# Patient Record
Sex: Male | Born: 1965 | Race: White | Hispanic: No | Smoking: Former smoker
Health system: Southern US, Community
[De-identification: ages and names within clinical notes are randomized; demographics above are authoritative.]

## PROBLEM LIST (undated history)

## (undated) DIAGNOSIS — J449 Chronic obstructive pulmonary disease, unspecified: Secondary | ICD-10-CM

## (undated) DIAGNOSIS — I5020 Unspecified systolic (congestive) heart failure: Secondary | ICD-10-CM

## (undated) DIAGNOSIS — I428 Other cardiomyopathies: Secondary | ICD-10-CM

## (undated) DIAGNOSIS — Z95810 Presence of automatic (implantable) cardiac defibrillator: Secondary | ICD-10-CM

## (undated) DIAGNOSIS — E039 Hypothyroidism, unspecified: Secondary | ICD-10-CM

## (undated) DIAGNOSIS — I4819 Other persistent atrial fibrillation: Secondary | ICD-10-CM

## (undated) DIAGNOSIS — I10 Essential (primary) hypertension: Secondary | ICD-10-CM

## (undated) DIAGNOSIS — E876 Hypokalemia: Secondary | ICD-10-CM

## (undated) DIAGNOSIS — G4730 Sleep apnea, unspecified: Secondary | ICD-10-CM

## (undated) DIAGNOSIS — F329 Major depressive disorder, single episode, unspecified: Secondary | ICD-10-CM

## (undated) DIAGNOSIS — Z8719 Personal history of other diseases of the digestive system: Secondary | ICD-10-CM

## (undated) DIAGNOSIS — I4821 Permanent atrial fibrillation: Secondary | ICD-10-CM

## (undated) HISTORY — DX: Other cardiomyopathies: I42.8

## (undated) HISTORY — DX: Major depressive disorder, single episode, unspecified: F32.9

## (undated) HISTORY — DX: Hypokalemia: E87.6

## (undated) HISTORY — PX: CARDIAC DEFIBRILLATOR PLACEMENT: SHX171

## (undated) HISTORY — DX: Personal history of other diseases of the digestive system: Z87.19

## (undated) HISTORY — DX: Permanent atrial fibrillation: I48.21

## (undated) HISTORY — DX: Hypothyroidism, unspecified: E03.9

## (undated) HISTORY — PX: SHOULDER DEBRIDEMENT: SHX1052

## (undated) HISTORY — DX: Unspecified systolic (congestive) heart failure: I50.20

## (undated) HISTORY — DX: Other persistent atrial fibrillation: I48.19

## (undated) HISTORY — PX: PACEMAKER IMPLANT: EP1218

---

## 2001-10-25 ENCOUNTER — Emergency Department (HOSPITAL_COMMUNITY): Admission: EM | Admit: 2001-10-25 | Discharge: 2001-10-25 | Payer: Self-pay | Admitting: Emergency Medicine

## 2001-10-25 ENCOUNTER — Encounter: Payer: Self-pay | Admitting: Emergency Medicine

## 2001-11-03 ENCOUNTER — Emergency Department (HOSPITAL_COMMUNITY): Admission: EM | Admit: 2001-11-03 | Discharge: 2001-11-03 | Payer: Self-pay

## 2008-06-09 ENCOUNTER — Emergency Department (HOSPITAL_COMMUNITY): Admission: EM | Admit: 2008-06-09 | Discharge: 2008-06-09 | Payer: Self-pay | Admitting: Emergency Medicine

## 2009-11-20 ENCOUNTER — Inpatient Hospital Stay: Payer: Self-pay | Admitting: Internal Medicine

## 2010-06-08 LAB — BASIC METABOLIC PANEL
BUN: 17 mg/dL (ref 6–23)
CO2: 23 mEq/L (ref 19–32)
Calcium: 9.5 mg/dL (ref 8.4–10.5)
Chloride: 94 mEq/L — ABNORMAL LOW (ref 96–112)
Creatinine, Ser: 1.27 mg/dL (ref 0.4–1.5)
GFR calc Af Amer: >60 mL/min (ref >60)
GFR calc non Af Amer: >60 mL/min (ref >60)
Glucose, Bld: 125 mg/dL — ABNORMAL HIGH (ref 70–99)
Potassium: 3.5 mEq/L (ref 3.5–5.1)
Sodium: 132 mEq/L — ABNORMAL LOW (ref 135–145)

## 2010-06-08 LAB — CBC
HCT: 50.3 % (ref 39.0–52.0)
Hemoglobin: 17.5 g/dL — ABNORMAL HIGH (ref 13.0–17.0)
MCHC: 34.9 g/dL (ref 30.0–36.0)
MCV: 93.5 fL (ref 78.0–100.0)
Platelets: 145 10*3/uL — ABNORMAL LOW (ref 150–400)
RBC: 5.38 MIL/uL (ref 4.22–5.81)
RDW: 13.2 % (ref 11.5–15.5)
WBC: 10.3 10*3/uL (ref 4.0–10.5)

## 2010-06-08 LAB — URINE MICROSCOPIC-ADD ON

## 2010-06-08 LAB — DIFFERENTIAL
Basophils Absolute: 0 10*3/uL (ref 0.0–0.1)
Basophils Relative: 0 % (ref 0–1)
Monocytes Relative: 13 % — ABNORMAL HIGH (ref 3–12)
Neutro Abs: 8.1 10*3/uL — ABNORMAL HIGH (ref 1.7–7.7)
Neutrophils Relative %: 79 % — ABNORMAL HIGH (ref 43–77)

## 2010-06-08 LAB — URINALYSIS, ROUTINE W REFLEX MICROSCOPIC
Glucose, UA: NEGATIVE mg/dL
Ketones, ur: NEGATIVE mg/dL
Leukocytes, UA: NEGATIVE
Nitrite: NEGATIVE
Protein, ur: 100 mg/dL — AB
Specific Gravity, Urine: >1.03 — ABNORMAL HIGH (ref 1.005–1.030)
Urobilinogen, UA: 0.2 mg/dL (ref 0.0–1.0)
pH: 6 (ref 5.0–8.0)

## 2013-04-01 DIAGNOSIS — Z9989 Dependence on other enabling machines and devices: Secondary | ICD-10-CM

## 2013-04-01 DIAGNOSIS — G4733 Obstructive sleep apnea (adult) (pediatric): Secondary | ICD-10-CM | POA: Insufficient documentation

## 2013-04-01 DIAGNOSIS — F1290 Cannabis use, unspecified, uncomplicated: Secondary | ICD-10-CM | POA: Insufficient documentation

## 2013-04-01 DIAGNOSIS — Z720 Tobacco use: Secondary | ICD-10-CM | POA: Insufficient documentation

## 2013-04-01 DIAGNOSIS — I10 Essential (primary) hypertension: Secondary | ICD-10-CM | POA: Insufficient documentation

## 2013-04-01 DIAGNOSIS — R109 Unspecified abdominal pain: Secondary | ICD-10-CM | POA: Insufficient documentation

## 2013-04-01 DIAGNOSIS — I5022 Chronic systolic (congestive) heart failure: Secondary | ICD-10-CM | POA: Insufficient documentation

## 2013-04-01 DIAGNOSIS — J449 Chronic obstructive pulmonary disease, unspecified: Secondary | ICD-10-CM | POA: Insufficient documentation

## 2013-04-01 DIAGNOSIS — I4819 Other persistent atrial fibrillation: Secondary | ICD-10-CM | POA: Insufficient documentation

## 2013-04-01 DIAGNOSIS — M109 Gout, unspecified: Secondary | ICD-10-CM | POA: Insufficient documentation

## 2013-04-03 DIAGNOSIS — Z515 Encounter for palliative care: Secondary | ICD-10-CM | POA: Insufficient documentation

## 2013-04-03 DIAGNOSIS — K7200 Acute and subacute hepatic failure without coma: Secondary | ICD-10-CM | POA: Insufficient documentation

## 2013-04-03 DIAGNOSIS — R5383 Other fatigue: Secondary | ICD-10-CM | POA: Insufficient documentation

## 2013-04-04 DIAGNOSIS — J9691 Respiratory failure, unspecified with hypoxia: Secondary | ICD-10-CM | POA: Insufficient documentation

## 2013-09-26 DIAGNOSIS — I447 Left bundle-branch block, unspecified: Secondary | ICD-10-CM | POA: Insufficient documentation

## 2013-09-26 DIAGNOSIS — Z9119 Patient's noncompliance with other medical treatment and regimen: Secondary | ICD-10-CM | POA: Insufficient documentation

## 2013-09-26 DIAGNOSIS — N182 Chronic kidney disease, stage 2 (mild): Secondary | ICD-10-CM | POA: Insufficient documentation

## 2013-09-26 DIAGNOSIS — F1910 Other psychoactive substance abuse, uncomplicated: Secondary | ICD-10-CM | POA: Insufficient documentation

## 2013-09-26 DIAGNOSIS — Z91199 Patient's noncompliance with other medical treatment and regimen due to unspecified reason: Secondary | ICD-10-CM | POA: Insufficient documentation

## 2013-09-26 DIAGNOSIS — I2510 Atherosclerotic heart disease of native coronary artery without angina pectoris: Secondary | ICD-10-CM | POA: Insufficient documentation

## 2013-12-01 ENCOUNTER — Emergency Department: Payer: Self-pay | Admitting: Emergency Medicine

## 2013-12-01 DIAGNOSIS — Z4502 Encounter for adjustment and management of automatic implantable cardiac defibrillator: Secondary | ICD-10-CM | POA: Insufficient documentation

## 2013-12-01 LAB — COMPREHENSIVE METABOLIC PANEL
ALBUMIN: 3.6 g/dL (ref 3.4–5.0)
ALK PHOS: 76 U/L
ALT: 20 U/L
ANION GAP: 6 — AB (ref 7–16)
BILIRUBIN TOTAL: 0.6 mg/dL (ref 0.2–1.0)
BUN: 11 mg/dL (ref 7–18)
Calcium, Total: 8.6 mg/dL (ref 8.5–10.1)
Chloride: 107 mmol/L (ref 98–107)
Co2: 28 mmol/L (ref 21–32)
Creatinine: 1.21 mg/dL (ref 0.60–1.30)
EGFR (Non-African Amer.): >60
GLUCOSE: 107 mg/dL — AB (ref 65–99)
Osmolality: 281 (ref 275–301)
Potassium: 4 mmol/L (ref 3.5–5.1)
SGOT(AST): 28 U/L (ref 15–37)
Sodium: 141 mmol/L (ref 136–145)
TOTAL PROTEIN: 7.2 g/dL (ref 6.4–8.2)

## 2013-12-01 LAB — CBC
HCT: 45 % (ref 40.0–52.0)
HGB: 14.5 g/dL (ref 13.0–18.0)
MCH: 31 pg (ref 26.0–34.0)
MCHC: 32.3 g/dL (ref 32.0–36.0)
MCV: 96 fL (ref 80–100)
PLATELETS: 127 10*3/uL — AB (ref 150–440)
RBC: 4.69 10*6/uL (ref 4.40–5.90)
RDW: 14.9 % — AB (ref 11.5–14.5)
WBC: 8 10*3/uL (ref 3.8–10.6)

## 2013-12-01 LAB — PROTIME-INR
INR: 1
Prothrombin Time: 12.9 secs (ref 11.5–14.7)

## 2013-12-01 LAB — TROPONIN I: TROPONIN-I: 0.16 ng/mL — AB

## 2013-12-01 LAB — APTT: ACTIVATED PTT: 27.4 s (ref 23.6–35.9)

## 2013-12-01 LAB — CK TOTAL AND CKMB (NOT AT ARMC)
CK, Total: 96 U/L
CK-MB: 3.5 ng/mL (ref 0.5–3.6)

## 2014-03-19 DIAGNOSIS — R1011 Right upper quadrant pain: Secondary | ICD-10-CM | POA: Insufficient documentation

## 2014-03-19 DIAGNOSIS — R1013 Epigastric pain: Secondary | ICD-10-CM | POA: Insufficient documentation

## 2015-04-29 ENCOUNTER — Emergency Department (HOSPITAL_COMMUNITY): Payer: Medicaid Other

## 2015-04-29 ENCOUNTER — Emergency Department (HOSPITAL_COMMUNITY)
Admission: EM | Admit: 2015-04-29 | Discharge: 2015-04-29 | Disposition: A | Discharge status: Home / Self Care | Payer: Medicaid Other | Attending: Emergency Medicine | Admitting: Emergency Medicine

## 2015-04-29 ENCOUNTER — Encounter (HOSPITAL_COMMUNITY): Payer: Self-pay | Admitting: Emergency Medicine

## 2015-04-29 DIAGNOSIS — J069 Acute upper respiratory infection, unspecified: Secondary | ICD-10-CM | POA: Diagnosis not present

## 2015-04-29 DIAGNOSIS — I10 Essential (primary) hypertension: Secondary | ICD-10-CM | POA: Diagnosis not present

## 2015-04-29 DIAGNOSIS — Z950 Presence of cardiac pacemaker: Secondary | ICD-10-CM | POA: Diagnosis not present

## 2015-04-29 DIAGNOSIS — E669 Obesity, unspecified: Secondary | ICD-10-CM | POA: Diagnosis not present

## 2015-04-29 DIAGNOSIS — J441 Chronic obstructive pulmonary disease with (acute) exacerbation: Secondary | ICD-10-CM | POA: Diagnosis not present

## 2015-04-29 DIAGNOSIS — I509 Heart failure, unspecified: Secondary | ICD-10-CM | POA: Insufficient documentation

## 2015-04-29 DIAGNOSIS — Z87891 Personal history of nicotine dependence: Secondary | ICD-10-CM | POA: Diagnosis not present

## 2015-04-29 DIAGNOSIS — R079 Chest pain, unspecified: Secondary | ICD-10-CM | POA: Diagnosis present

## 2015-04-29 HISTORY — DX: Chronic obstructive pulmonary disease, unspecified: J44.9

## 2015-04-29 HISTORY — DX: Essential (primary) hypertension: I10

## 2015-04-29 LAB — CBC
HEMATOCRIT: 43.3 % (ref 39.0–52.0)
Hemoglobin: 14.5 g/dL (ref 13.0–17.0)
MCH: 32.2 pg (ref 26.0–34.0)
MCHC: 33.5 g/dL (ref 30.0–36.0)
MCV: 96 fL (ref 78.0–100.0)
Platelets: 136 10*3/uL — ABNORMAL LOW (ref 150–400)
RBC: 4.51 MIL/uL (ref 4.22–5.81)
RDW: 13.8 % (ref 11.5–15.5)
WBC: 10.3 10*3/uL (ref 4.0–10.5)

## 2015-04-29 LAB — I-STAT TROPONIN, ED: Troponin i, poc: 0.03 ng/mL (ref 0.00–0.08)

## 2015-04-29 LAB — BASIC METABOLIC PANEL
Anion gap: 12 (ref 5–15)
BUN: 9 mg/dL (ref 6–20)
CHLORIDE: 101 mmol/L (ref 101–111)
CO2: 25 mmol/L (ref 22–32)
Calcium: 9.2 mg/dL (ref 8.9–10.3)
Creatinine, Ser: 1.4 mg/dL — ABNORMAL HIGH (ref 0.61–1.24)
GFR calc non Af Amer: 57 mL/min — ABNORMAL LOW (ref >60)
Glucose, Bld: 104 mg/dL — ABNORMAL HIGH (ref 65–99)
POTASSIUM: 4.4 mmol/L (ref 3.5–5.1)
SODIUM: 138 mmol/L (ref 135–145)

## 2015-04-29 MED ORDER — IBUPROFEN 800 MG PO TABS
800.0000 mg | ORAL_TABLET | Freq: Once | ORAL | Status: AC
  Administered 2015-04-29: 800 mg via ORAL
  Filled 2015-04-29: qty 1

## 2015-04-29 MED ORDER — IPRATROPIUM-ALBUTEROL 0.5-2.5 (3) MG/3ML IN SOLN
3.0000 mL | Freq: Once | RESPIRATORY_TRACT | Status: AC
  Administered 2015-04-29: 3 mL via RESPIRATORY_TRACT
  Filled 2015-04-29: qty 3

## 2015-04-29 MED ORDER — ACETAMINOPHEN 325 MG PO TABS
650.0000 mg | ORAL_TABLET | Freq: Once | ORAL | Status: AC
  Administered 2015-04-29: 650 mg via ORAL
  Filled 2015-04-29: qty 2

## 2015-04-29 MED ORDER — PREDNISONE 20 MG PO TABS
60.0000 mg | ORAL_TABLET | Freq: Once | ORAL | Status: AC
  Administered 2015-04-29: 60 mg via ORAL
  Filled 2015-04-29: qty 3

## 2015-04-29 MED ORDER — ACETAMINOPHEN 325 MG PO TABS
325.0000 mg | ORAL_TABLET | Freq: Once | ORAL | Status: AC
  Administered 2015-04-29: 325 mg via ORAL
  Filled 2015-04-29: qty 1

## 2015-04-29 NOTE — ED Notes (Signed)
ED PA at bedside

## 2015-04-29 NOTE — Discharge Instructions (Signed)
[name] WAS,  Nice meeting you! Please follow-up with your primary care provider. Return to the emergency department if you develop increased chest pain, shortness of breath. Feel better soon!  S. Wendie Simmer, PA-C   Upper Respiratory Infection, Adult Most upper respiratory infections (URIs) are a viral infection of the air passages leading to the lungs. A URI affects the nose, throat, and upper air passages. The most common type of URI is nasopharyngitis and is typically referred to as "the common cold." URIs run their course and usually go away on their own. Most of the time, a URI does not require medical attention, but sometimes a bacterial infection in the upper airways can follow a viral infection. This is called a secondary infection. Sinus and middle ear infections are common types of secondary upper respiratory infections. Bacterial pneumonia can also complicate a URI. A URI can worsen asthma and chronic obstructive pulmonary disease (COPD). Sometimes, these complications can require emergency medical care and may be life threatening.  CAUSES Almost all URIs are caused by viruses. A virus is a type of germ and can spread from one person to another.  RISKS FACTORS You may be at risk for a URI if:   You smoke.   You have chronic heart or lung disease.  You have a weakened defense (immune) system.   You are very young or very old.   You have nasal allergies or asthma.  You work in crowded or poorly ventilated areas.  You work in health care facilities or schools. SIGNS AND SYMPTOMS  Symptoms typically develop 2-3 days after you come in contact with a cold virus. Most viral URIs last 7-10 days. However, viral URIs from the influenza virus (flu virus) can last 14-18 days and are typically more severe. Symptoms may include:   Runny or stuffy (congested) nose.   Sneezing.   Cough.   Sore throat.   Headache.   Fatigue.   Fever.   Loss of appetite.    Pain in your forehead, behind your eyes, and over your cheekbones (sinus pain).  Muscle aches.  DIAGNOSIS  Your health care provider may diagnose a URI by:  Physical exam.  Tests to check that your symptoms are not due to another condition such as:  Strep throat.  Sinusitis.  Pneumonia.  Asthma. TREATMENT  A URI goes away on its own with time. It cannot be cured with medicines, but medicines may be prescribed or recommended to relieve symptoms. Medicines may help:  Reduce your fever.  Reduce your cough.  Relieve nasal congestion. HOME CARE INSTRUCTIONS   Take medicines only as directed by your health care provider.   Gargle warm saltwater or take cough drops to comfort your throat as directed by your health care provider.  Use a warm mist humidifier or inhale steam from a shower to increase air moisture. This may make it easier to breathe.  Drink enough fluid to keep your urine clear or pale yellow.   Eat soups and other clear broths and maintain good nutrition.   Rest as needed.   Return to work when your temperature has returned to normal or as your health care provider advises. You may need to stay home longer to avoid infecting others. You can also use a face mask and careful hand washing to prevent spread of the virus.  Increase the usage of your inhaler if you have asthma.   Do not use any tobacco products, including cigarettes, chewing tobacco, or electronic  cigarettes. If you need help quitting, ask your health care provider. PREVENTION  The best way to protect yourself from getting a cold is to practice good hygiene.   Avoid oral or hand contact with people with cold symptoms.   Wash your hands often if contact occurs.  There is no clear evidence that vitamin C, vitamin E, echinacea, or exercise reduces the chance of developing a cold. However, it is always recommended to get plenty of rest, exercise, and practice good nutrition.  SEEK MEDICAL  CARE IF:   You are getting worse rather than better.   Your symptoms are not controlled by medicine.   You have chills.  You have worsening shortness of breath.  You have brown or red mucus.  You have yellow or brown nasal discharge.  You have pain in your face, especially when you bend forward.  You have a fever.  You have swollen neck glands.  You have pain while swallowing.  You have white areas in the back of your throat. SEEK IMMEDIATE MEDICAL CARE IF:   You have severe or persistent:  Headache.  Ear pain.  Sinus pain.  Chest pain.  You have chronic lung disease and any of the following:  Wheezing.  Prolonged cough.  Coughing up blood.  A change in your usual mucus.  You have a stiff neck.  You have changes in your:  Vision.  Hearing.  Thinking.  Mood. MAKE SURE YOU:   Understand these instructions.  Will watch your condition.  Will get help right away if you are not doing well or get worse.   This information is not intended to replace advice given to you by your health care provider. Make sure you discuss any questions you have with your health care provider.   Document Released: 08/09/2000 Document Revised: 06/30/2014 Document Reviewed: 05/21/2013 Elsevier Interactive Patient Education [employer].

## 2015-04-29 NOTE — ED Notes (Signed)
Pt styates the last couple of days he has had a dry cough and feeling congested in his nose and chest. Pt states last night he started having a tightness his in chest with sob.

## 2015-04-29 NOTE — ED Provider Notes (Signed)
CSN: [id number]     Arrival date & time 04/29/15  1505 History   First MD Initiated Contact with Patient 04/29/15 1953     Chief Complaint  Patient presents with  . Chest Pain   HPI   Gary Kirk is a 50 y.o. male PMH significant for congestive heart failure, COPD, pacemaker, defibrillator presenting with a 2 day history of dry cough and nasal/chest congestion. He states he started having chest pain and SOB last night. He describes the chest pain as intermittent, only when he coughs, a tightness, 4/10 pain scale, diffuse, nonexertional. He denies fevers, chills, abdominal pain, nausea, radiating pain, leg swelling/weight gain.   Past Medical History  Diagnosis Date  . Hypertension   . CHF (congestive heart failure) ([place])   . A-fib ([place])   . COPD (chronic obstructive pulmonary disease) [facility])    Past Surgical History  Procedure Laterality Date  . Cardiac defibrillator placement     No family history on file. Social History  Substance Use Topics  . Smoking status: Former Smoker    Quit date: 04/29/2014  . Smokeless tobacco: None  . Alcohol Use: No    Review of Systems  Ten systems are reviewed and are negative for acute change except as noted in the HPI  Allergies  Motrin and Tramadol  Home Medications   Prior to Admission medications   Not on File   BP 153/97 mmHg  Pulse 89  Temp(Src) 101.1 F (38.4 C) (Oral)  Resp 20  Ht 6' (1.829 m)  Wt 154.223 kg  BMI 46.10 kg/m2  SpO2 99% Physical Exam  Constitutional: He appears well-developed and well-nourished. No distress.  Obese  HENT:  Head: Normocephalic and atraumatic.  Right Ear: External ear normal.  Left Ear: External ear normal.  Nose: Nose normal.  Mouth/Throat: Oropharynx is clear and moist. No oropharyngeal exudate.  BL TM without bulging or erythema.   Eyes: Conjunctivae are normal. Pupils are equal, round, and reactive to light. Right eye exhibits no discharge. Left eye exhibits no discharge. No  scleral icterus.  Neck: No tracheal deviation present.  Cardiovascular: Normal rate, regular rhythm, normal heart sounds and intact distal pulses.  Exam reveals no gallop and no friction rub.   No murmur heard. Pulmonary/Chest: Effort normal. No respiratory distress. He has wheezes. He has no rales. He exhibits no tenderness.  End expiratory wheezing BL  Abdominal: Soft. Bowel sounds are normal. He exhibits no distension and no mass. There is no tenderness. There is no rebound and no guarding.  Musculoskeletal: He exhibits no edema.  Lymphadenopathy:    He has no cervical adenopathy.  Neurological: He is alert. Coordination normal.  Skin: Skin is warm and dry. No rash noted. He is not diaphoretic. No erythema.  Psychiatric: He has a normal mood and affect. His behavior is normal.  Nursing note and vitals reviewed.   ED Course  Procedures  Labs Review Labs Reviewed  BASIC METABOLIC PANEL - Abnormal; Notable for the following:    Glucose, Bld 104 (*)    Creatinine, Ser 1.40 (*)    GFR calc non Af Amer 57 (*)    All other components within normal limits  CBC - Abnormal; Notable for the following:    Platelets 136 (*)    All other components within normal limits  I-STAT TROPOININ, ED    Imaging Review Dg Chest 2 View  04/29/2015  CLINICAL DATA:  Chest pain, dry cough, shortness of breath for 2 days.  EXAM: CHEST  2 VIEW COMPARISON:  Chest x-ray dated 12/01/2013. FINDINGS: Mild cardiomegaly is stable. Left chest wall pacemaker/AICD is stable in position. Lungs are clear. No pleural effusion. No pneumothorax seen. Osseous structures about the chest are unremarkable. IMPRESSION: Stable chest x-ray. Stable mild cardiomegaly. Lungs are clear and there is no evidence of acute cardiopulmonary abnormality. Electronically Signed   By: Franki Cabot M.D.   On: 04/29/2015 15:37   I have personally reviewed and evaluated these images and lab results as part of my medical decision-making.   EKG  Interpretation   Date/Time:  Thursday April 29 2015 15:14:41 EST Ventricular Rate:  90 PR Interval:    QRS Duration: 118 QT Interval:  382 QTC Calculation: 467 R Axis:   172 Text Interpretation:  Ventricular-paced rhythm Biventricular pacemaker  detected Abnormal ECG no significant change since 2015 Confirmed by  G[place]ON  MD, SCOTT ([id number]) on 04/29/2015 7:53:29 PM      MDM   Final diagnoses:  Upper respiratory infection   Patient febrile at 102.5. VSS otherwise stable.   Patient given breathing treatment, tylenol and ibuprofen. Afebrile with clear lung sounds upon reassessment Patient is to be discharged with recommendation to follow up with PCP in regards to today's hospital visit. Chest pain is not likely of cardiac or pulmonary etiology d/t presentation, PERC negative, VSS, no tracheal deviation, no JVD or new murmur, RRR, breath sounds equal bilaterally, EKG without acute abnormalities, negative troponin, and negative CXR. Pt has been advised to return to the ED if CP becomes exertional, associated with diaphoresis or nausea, radiates to left jaw/arm, worsens or becomes concerning in any way. Pt appears reliable for follow up and is agreeable to discharge.   Case has been discussed with Dr. Regenia Skeeter who agrees with the above plan to discharge.   [place] Lions, PA-C 05/04/15 [id number]  Sherwood Gambler, MD 05/05/15 [phone]

## 2015-05-23 ENCOUNTER — Encounter (HOSPITAL_COMMUNITY): Payer: Self-pay | Admitting: Emergency Medicine

## 2015-05-23 ENCOUNTER — Emergency Department (HOSPITAL_COMMUNITY)
Admission: EM | Admit: 2015-05-23 | Discharge: 2015-05-24 | Disposition: A | Discharge status: Other Acute Inpatient Hospital | Payer: Medicaid Other | Attending: Emergency Medicine | Admitting: Emergency Medicine

## 2015-05-23 ENCOUNTER — Ambulatory Visit (HOSPITAL_COMMUNITY)
Admission: AD | Admit: 2015-05-23 | Discharge: 2015-05-23 | Disposition: A | Discharge status: Home / Self Care | Payer: 59 | Source: Intra-hospital | Attending: Psychiatry | Admitting: Psychiatry

## 2015-05-23 DIAGNOSIS — I4891 Unspecified atrial fibrillation: Secondary | ICD-10-CM | POA: Diagnosis not present

## 2015-05-23 DIAGNOSIS — Z79899 Other long term (current) drug therapy: Secondary | ICD-10-CM | POA: Diagnosis not present

## 2015-05-23 DIAGNOSIS — R456 Violent behavior: Secondary | ICD-10-CM | POA: Insufficient documentation

## 2015-05-23 DIAGNOSIS — J449 Chronic obstructive pulmonary disease, unspecified: Secondary | ICD-10-CM | POA: Insufficient documentation

## 2015-05-23 DIAGNOSIS — Z87891 Personal history of nicotine dependence: Secondary | ICD-10-CM | POA: Diagnosis not present

## 2015-05-23 DIAGNOSIS — I509 Heart failure, unspecified: Secondary | ICD-10-CM | POA: Diagnosis not present

## 2015-05-23 DIAGNOSIS — Z008 Encounter for other general examination: Secondary | ICD-10-CM | POA: Diagnosis not present

## 2015-05-23 DIAGNOSIS — F332 Major depressive disorder, recurrent severe without psychotic features: Secondary | ICD-10-CM | POA: Diagnosis not present

## 2015-05-23 DIAGNOSIS — F329 Major depressive disorder, single episode, unspecified: Secondary | ICD-10-CM | POA: Diagnosis present

## 2015-05-23 DIAGNOSIS — I110 Hypertensive heart disease with heart failure: Secondary | ICD-10-CM | POA: Diagnosis not present

## 2015-05-23 DIAGNOSIS — R45851 Suicidal ideations: Secondary | ICD-10-CM | POA: Diagnosis not present

## 2015-05-23 DIAGNOSIS — I10 Essential (primary) hypertension: Secondary | ICD-10-CM | POA: Diagnosis not present

## 2015-05-23 DIAGNOSIS — Z7982 Long term (current) use of aspirin: Secondary | ICD-10-CM | POA: Diagnosis not present

## 2015-05-23 LAB — COMPREHENSIVE METABOLIC PANEL
ALK PHOS: 77 U/L (ref 38–126)
ALT: 32 U/L (ref 17–63)
ANION GAP: 10 (ref 5–15)
AST: 36 U/L (ref 15–41)
Albumin: 3.9 g/dL (ref 3.5–5.0)
BILIRUBIN TOTAL: 0.7 mg/dL (ref 0.3–1.2)
BUN: 16 mg/dL (ref 6–20)
CALCIUM: 9.2 mg/dL (ref 8.9–10.3)
CO2: 22 mmol/L (ref 22–32)
Chloride: 104 mmol/L (ref 101–111)
Creatinine, Ser: 1.33 mg/dL — ABNORMAL HIGH (ref 0.61–1.24)
Glucose, Bld: 90 mg/dL (ref 65–99)
POTASSIUM: 4.1 mmol/L (ref 3.5–5.1)
Sodium: 136 mmol/L (ref 135–145)
TOTAL PROTEIN: 8 g/dL (ref 6.5–8.1)

## 2015-05-23 LAB — RAPID URINE DRUG SCREEN, HOSP PERFORMED
Amphetamines: NOT DETECTED
BARBITURATES: NOT DETECTED
Benzodiazepines: NOT DETECTED
COCAINE: NOT DETECTED
OPIATES: NOT DETECTED
Tetrahydrocannabinol: NOT DETECTED

## 2015-05-23 LAB — CBC
HEMATOCRIT: 45.3 % (ref 39.0–52.0)
Hemoglobin: 15.4 g/dL (ref 13.0–17.0)
MCH: 32 pg (ref 26.0–34.0)
MCHC: 34 g/dL (ref 30.0–36.0)
MCV: 94 fL (ref 78.0–100.0)
PLATELETS: 197 10*3/uL (ref 150–400)
RBC: 4.82 MIL/uL (ref 4.22–5.81)
RDW: 13.4 % (ref 11.5–15.5)
WBC: 10 10*3/uL (ref 4.0–10.5)

## 2015-05-23 LAB — ACETAMINOPHEN LEVEL

## 2015-05-23 LAB — SALICYLATE LEVEL

## 2015-05-23 LAB — ETHANOL

## 2015-05-23 MED ORDER — POTASSIUM CHLORIDE CRYS ER 20 MEQ PO TBCR
20.0000 meq | EXTENDED_RELEASE_TABLET | Freq: Every day | ORAL | Status: DC
  Administered 2015-05-24: 20 meq via ORAL
  Filled 2015-05-23: qty 1

## 2015-05-23 MED ORDER — ALBUTEROL SULFATE HFA 108 (90 BASE) MCG/ACT IN AERS
2.0000 | INHALATION_SPRAY | Freq: Three times a day (TID) | RESPIRATORY_TRACT | Status: DC
  Administered 2015-05-24: 2 via RESPIRATORY_TRACT
  Filled 2015-05-23: qty 6.7

## 2015-05-23 MED ORDER — LORAZEPAM 1 MG PO TABS: 1.0000 mg | ORAL_TABLET | Freq: Three times a day (TID) | ORAL | Status: DC | PRN

## 2015-05-23 MED ORDER — APIXABAN 5 MG PO TABS
5.0000 mg | ORAL_TABLET | Freq: Two times a day (BID) | ORAL | Status: DC
  Administered 2015-05-24: 5 mg via ORAL
  Filled 2015-05-23 (×2): qty 1

## 2015-05-23 MED ORDER — NITROGLYCERIN 0.4 MG SL SUBL: 0.4000 mg | SUBLINGUAL_TABLET | SUBLINGUAL | Status: DC | PRN

## 2015-05-23 MED ORDER — FUROSEMIDE 40 MG PO TABS
40.0000 mg | ORAL_TABLET | Freq: Every day | ORAL | Status: DC
  Administered 2015-05-24: 40 mg via ORAL
  Filled 2015-05-23: qty 1

## 2015-05-23 MED ORDER — ONDANSETRON HCL 4 MG PO TABS: 4.0000 mg | ORAL_TABLET | Freq: Three times a day (TID) | ORAL | Status: DC | PRN

## 2015-05-23 MED ORDER — ASPIRIN EC 81 MG PO TBEC
81.0000 mg | DELAYED_RELEASE_TABLET | Freq: Every day | ORAL | Status: DC
  Administered 2015-05-24 (×2): 81 mg via ORAL
  Filled 2015-05-23 (×2): qty 1

## 2015-05-23 MED ORDER — NICOTINE 21 MG/24HR TD PT24: 21.0000 mg | MEDICATED_PATCH | Freq: Every day | TRANSDERMAL | Status: DC

## 2015-05-23 MED ORDER — AMIODARONE HCL 200 MG PO TABS
400.0000 mg | ORAL_TABLET | Freq: Every day | ORAL | Status: DC
  Administered 2015-05-24: 400 mg via ORAL
  Filled 2015-05-23: qty 2

## 2015-05-23 MED ORDER — LEVOTHYROXINE SODIUM 75 MCG PO TABS
75.0000 ug | ORAL_TABLET | Freq: Every day | ORAL | Status: DC
  Administered 2015-05-24: 75 ug via ORAL
  Filled 2015-05-23 (×2): qty 1

## 2015-05-23 MED ORDER — ACETAMINOPHEN 500 MG PO TABS: 1000.0000 mg | ORAL_TABLET | Freq: Four times a day (QID) | ORAL | Status: DC | PRN

## 2015-05-23 MED ORDER — TIOTROPIUM BROMIDE MONOHYDRATE 18 MCG IN CAPS
18.0000 ug | ORAL_CAPSULE | Freq: Every day | RESPIRATORY_TRACT | Status: DC | PRN
  Filled 2015-05-23: qty 5

## 2015-05-23 MED ORDER — CARVEDILOL 25 MG PO TABS
25.0000 mg | ORAL_TABLET | Freq: Two times a day (BID) | ORAL | Status: DC
  Administered 2015-05-24 (×2): 25 mg via ORAL
  Filled 2015-05-23 (×5): qty 1
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
## 2015-05-23 NOTE — ED Notes (Signed)
No bed available at [facility].

## 2015-05-23 NOTE — ED Provider Notes (Signed)
CSN: [id number]     Arrival date & time 05/23/15  2032 History   First MD Initiated Contact with Patient 05/23/15 2224     Chief Complaint  Patient presents with  . Suicidal     (Consider location/radiation/quality/duration/timing/severity/associated sxs/prior Treatment) HPI Comments: Patient here with suicidal ideations with plan to jump off a bridge. Patient states he's had multiple stressors at home and did not want to elaborate. Denies responding to internal stimuli. No auditory of visual hallucinations. No current alcohol or drug use. Went to behavior health and was sent her for medical clearance. He denies any somatic complaints of chest pain or shortness of breath. No palpitations. Nothing appeared nursing degree. No treatment for his current condition use prior to arrival  The history is provided by the patient.    Past Medical History  Diagnosis Date  . Hypertension   . CHF (congestive heart failure) ([place])   . A-fib ([place])   . COPD (chronic obstructive pulmonary disease) [facility])    Past Surgical History  Procedure Laterality Date  . Cardiac defibrillator placement     No family history on file. Social History  Substance Use Topics  . Smoking status: Former Smoker    Quit date: 04/29/2014  . Smokeless tobacco: None  . Alcohol Use: No    Review of Systems  All other systems reviewed and are negative.     Allergies  Tramadol and Motrin  Home Medications   Prior to Admission medications   Medication Sig Start Date End Date Taking? Authorizing Provider  acetaminophen (TYLENOL) 500 MG tablet Take 1,000 mg by mouth every 6 (six) hours as needed for mild pain, moderate pain, fever or headache.   Yes Historical Provider, MD  albuterol (PROAIR HFA) 108 (90 Base) MCG/ACT inhaler Inhale 2 puffs into the lungs every 8 (eight) hours.    Yes Historical Provider, MD  albuterol (PROVENTIL) (2.5 MG/3ML) 0.083% nebulizer solution 2.5 mg.   Yes Historical Provider, MD  amiodarone  (PACERONE) 400 MG tablet Take 400 mg by mouth daily. 01/06/15  Yes Historical Provider, MD  aspirin EC 81 MG tablet Take 81 mg by mouth daily. 04/12/15 04/11/16 Yes Historical Provider, MD  carvedilol (COREG) 25 MG tablet Take 25 mg by mouth 2 (two) times daily. 09/15/13  Yes Historical Provider, MD  ELIQUIS 5 MG TABS tablet Take 5 mg by mouth 2 (two) times daily. 04/26/15  Yes Historical Provider, MD  furosemide (LASIX) 40 MG tablet Take 40 mg by mouth daily. 03/12/15  Yes Historical Provider, MD  HYDROcodone-acetaminophen (NORCO/VICODIN) 5-325 MG tablet Take 1 tablet by mouth every 6 (six) hours as needed for moderate pain.   Yes Historical Provider, MD  levothyroxine (SYNTHROID, LEVOTHROID) 75 MCG tablet Take 75 mcg by mouth daily before breakfast.   Yes Historical Provider, MD  nitroGLYCERIN (NITROSTAT) 0.4 MG SL tablet Place 0.4 mg under the tongue every 5 (five) minutes as needed for chest pain.  04/12/15  Yes Historical Provider, MD  potassium chloride SA (K-DUR,KLOR-CON) 20 MEQ tablet Take 20 mEq by mouth daily. 04/12/15  Yes Historical Provider, MD  tiotropium (SPIRIVA HANDIHALER) 18 MCG inhalation capsule Place 18 mcg into inhaler and inhale daily as needed (for shortness of breath).  09/04/13  Yes Historical Provider, MD   BP 144/90 mmHg  Pulse 88  Temp(Src) 98.4 F (36.9 C) (Oral)  Resp 20  SpO2 98% Physical Exam  Constitutional: He is oriented to person, place, and time. He appears well-developed and well-nourished.  Non-toxic  appearance. No distress.  HENT:  Head: Normocephalic and atraumatic.  Eyes: Conjunctivae, EOM and lids are normal. Pupils are equal, round, and reactive to light.  Neck: Normal range of motion. Neck supple. No tracheal deviation present. No thyroid mass present.  Cardiovascular: Normal rate, regular rhythm and normal heart sounds.  Exam reveals no gallop.   No murmur heard. Pulmonary/Chest: Effort normal and breath sounds normal. No stridor. No respiratory distress.  He has no decreased breath sounds. He has no wheezes. He has no rhonchi. He has no rales.  Abdominal: Soft. Normal appearance and bowel sounds are normal. He exhibits no distension. There is no tenderness. There is no rebound and no CVA tenderness.  Musculoskeletal: Normal range of motion. He exhibits no edema or tenderness.  Neurological: He is alert and oriented to person, place, and time. He has normal strength. No cranial nerve deficit or sensory deficit. GCS eye subscore is 4. GCS verbal subscore is 5. GCS motor subscore is 6.  Skin: Skin is warm and dry. No abrasion and no rash noted.  Psychiatric: His affect is blunt. His speech is delayed. He is slowed. Thought content is not delusional. He expresses suicidal ideation. He expresses suicidal plans. He expresses no homicidal plans.  Nursing note and vitals reviewed.   ED Course  Procedures (including critical care time) Labs Review Labs Reviewed  COMPREHENSIVE METABOLIC PANEL - Abnormal; Notable for the following:    Creatinine, Ser 1.33 (*)    All other components within normal limits  ACETAMINOPHEN LEVEL - Abnormal; Notable for the following:    Acetaminophen (Tylenol), Serum <10 (*)    All other components within normal limits  ETHANOL  SALICYLATE LEVEL  CBC  URINE RAPID DRUG SCREEN, HOSP PERFORMED    Imaging Review No results found. I have personally reviewed and evaluated these images and lab results as part of my medical decision-making.   EKG Interpretation None      MDM   Final diagnoses:  None    Patient has been medically cleared to be evaluated by the psychiatry service for placement    Lacretia Leigh, MD 05/23/15 2244

## 2015-05-23 NOTE — ED Notes (Signed)
Staffing notified of need for sitter.  

## 2015-05-23 NOTE — ED Notes (Signed)
Pt sent from [facility] for medical clearance and to wait for bed availability. Pt endorses SI due to multiple stressors, when ask if he has a plan pt states "[place] [address] it would be real easy". Pt also would like an abcess to L thigh I & D.

## 2015-05-24 ENCOUNTER — Encounter (HOSPITAL_COMMUNITY): Payer: Self-pay

## 2015-05-24 ENCOUNTER — Inpatient Hospital Stay (HOSPITAL_COMMUNITY)
Admission: AD | Admit: 2015-05-24 | Discharge: 2015-06-02 | DRG: 885 | Disposition: A | Discharge status: Home / Self Care | Payer: MEDICAID | Source: Intra-hospital | Attending: Psychiatry | Admitting: Psychiatry

## 2015-05-24 DIAGNOSIS — Z818 Family history of other mental and behavioral disorders: Secondary | ICD-10-CM | POA: Diagnosis not present

## 2015-05-24 DIAGNOSIS — I5022 Chronic systolic (congestive) heart failure: Secondary | ICD-10-CM | POA: Diagnosis present

## 2015-05-24 DIAGNOSIS — E039 Hypothyroidism, unspecified: Secondary | ICD-10-CM | POA: Diagnosis present

## 2015-05-24 DIAGNOSIS — Z7901 Long term (current) use of anticoagulants: Secondary | ICD-10-CM | POA: Diagnosis not present

## 2015-05-24 DIAGNOSIS — N183 Chronic kidney disease, stage 3 (moderate): Secondary | ICD-10-CM | POA: Diagnosis present

## 2015-05-24 DIAGNOSIS — Z7982 Long term (current) use of aspirin: Secondary | ICD-10-CM | POA: Diagnosis not present

## 2015-05-24 DIAGNOSIS — Z008 Encounter for other general examination: Secondary | ICD-10-CM | POA: Diagnosis not present

## 2015-05-24 DIAGNOSIS — Z87891 Personal history of nicotine dependence: Secondary | ICD-10-CM

## 2015-05-24 DIAGNOSIS — Z95810 Presence of automatic (implantable) cardiac defibrillator: Secondary | ICD-10-CM

## 2015-05-24 DIAGNOSIS — F332 Major depressive disorder, recurrent severe without psychotic features: Principal | ICD-10-CM | POA: Diagnosis present

## 2015-05-24 DIAGNOSIS — R45851 Suicidal ideations: Secondary | ICD-10-CM

## 2015-05-24 DIAGNOSIS — I428 Other cardiomyopathies: Secondary | ICD-10-CM | POA: Insufficient documentation

## 2015-05-24 DIAGNOSIS — G4700 Insomnia, unspecified: Secondary | ICD-10-CM | POA: Diagnosis present

## 2015-05-24 DIAGNOSIS — R9431 Abnormal electrocardiogram [ECG] [EKG]: Secondary | ICD-10-CM | POA: Insufficient documentation

## 2015-05-24 DIAGNOSIS — I429 Cardiomyopathy, unspecified: Secondary | ICD-10-CM | POA: Diagnosis present

## 2015-05-24 DIAGNOSIS — I130 Hypertensive heart and chronic kidney disease with heart failure and stage 1 through stage 4 chronic kidney disease, or unspecified chronic kidney disease: Secondary | ICD-10-CM | POA: Diagnosis present

## 2015-05-24 DIAGNOSIS — Z915 Personal history of self-harm: Secondary | ICD-10-CM

## 2015-05-24 DIAGNOSIS — F329 Major depressive disorder, single episode, unspecified: Secondary | ICD-10-CM | POA: Diagnosis present

## 2015-05-24 DIAGNOSIS — R7989 Other specified abnormal findings of blood chemistry: Secondary | ICD-10-CM | POA: Insufficient documentation

## 2015-05-24 DIAGNOSIS — J449 Chronic obstructive pulmonary disease, unspecified: Secondary | ICD-10-CM | POA: Diagnosis present

## 2015-05-24 DIAGNOSIS — R748 Abnormal levels of other serum enzymes: Secondary | ICD-10-CM | POA: Diagnosis not present

## 2015-05-24 DIAGNOSIS — I481 Persistent atrial fibrillation: Secondary | ICD-10-CM | POA: Diagnosis present

## 2015-05-24 LAB — URINALYSIS, ROUTINE W REFLEX MICROSCOPIC
BILIRUBIN URINE: NEGATIVE
Glucose, UA: NEGATIVE mg/dL
Ketones, ur: NEGATIVE mg/dL
Leukocytes, UA: NEGATIVE
NITRITE: NEGATIVE
Protein, ur: NEGATIVE mg/dL
SPECIFIC GRAVITY, URINE: 1.011 (ref 1.005–1.030)
pH: 5 (ref 5.0–8.0)

## 2015-05-24 LAB — URINE MICROSCOPIC-ADD ON
Bacteria, UA: NONE SEEN
WBC UA: NONE SEEN WBC/hpf (ref 0–5)

## 2015-05-24 MED ORDER — TIOTROPIUM BROMIDE MONOHYDRATE 18 MCG IN CAPS: 18.0000 ug | ORAL_CAPSULE | Freq: Every day | RESPIRATORY_TRACT | Status: DC | PRN

## 2015-05-24 MED ORDER — AMIODARONE HCL 200 MG PO TABS
400.0000 mg | ORAL_TABLET | Freq: Every day | ORAL | Status: DC
  Administered 2015-05-25 – 2015-06-02 (×9): 400 mg via ORAL
  Filled 2015-05-24 (×12): qty 2

## 2015-05-24 MED ORDER — ASPIRIN EC 81 MG PO TBEC: 81.0000 mg | DELAYED_RELEASE_TABLET | Freq: Every day | ORAL | Status: DC

## 2015-05-24 MED ORDER — POTASSIUM CHLORIDE CRYS ER 20 MEQ PO TBCR
20.0000 meq | EXTENDED_RELEASE_TABLET | Freq: Every day | ORAL | Status: DC
  Administered 2015-05-25 – 2015-06-02 (×9): 20 meq via ORAL
  Filled 2015-05-24 (×12): qty 1

## 2015-05-24 MED ORDER — TRAZODONE HCL 100 MG PO TABS
100.0000 mg | ORAL_TABLET | Freq: Every evening | ORAL | Status: DC | PRN
  Administered 2015-05-24 – 2015-05-26 (×3): 100 mg via ORAL
  Filled 2015-05-24 (×12): qty 1

## 2015-05-24 MED ORDER — ALBUTEROL SULFATE (2.5 MG/3ML) 0.083% IN NEBU: 2.5000 mg | INHALATION_SOLUTION | Freq: Four times a day (QID) | RESPIRATORY_TRACT | Status: DC | PRN

## 2015-05-24 MED ORDER — ALUM & MAG HYDROXIDE-SIMETH 200-200-20 MG/5ML PO SUSP: 30.0000 mL | ORAL | Status: DC | PRN

## 2015-05-24 MED ORDER — APIXABAN 5 MG PO TABS
5.0000 mg | ORAL_TABLET | Freq: Two times a day (BID) | ORAL | Status: DC
  Administered 2015-05-24 – 2015-06-02 (×18): 5 mg via ORAL
  Filled 2015-05-24 (×23): qty 1

## 2015-05-24 MED ORDER — CARVEDILOL 25 MG PO TABS
25.0000 mg | ORAL_TABLET | Freq: Two times a day (BID) | ORAL | Status: DC
  Administered 2015-05-24 – 2015-06-02 (×18): 25 mg via ORAL
  Filled 2015-05-24 (×2): qty 1
  Filled 2015-05-24: qty 2
  Filled 2015-05-24 (×3): qty 1
  Filled 2015-05-24: qty 2
  Filled 2015-05-24 (×17): qty 1

## 2015-05-24 MED ORDER — CITALOPRAM HYDROBROMIDE 20 MG PO TABS
20.0000 mg | ORAL_TABLET | Freq: Every day | ORAL | Status: DC
  Administered 2015-05-24: 20 mg via ORAL
  Filled 2015-05-24 (×2): qty 1

## 2015-05-24 MED ORDER — FUROSEMIDE 40 MG PO TABS
40.0000 mg | ORAL_TABLET | Freq: Every day | ORAL | Status: DC
  Administered 2015-05-25 – 2015-06-02 (×9): 40 mg via ORAL
  Filled 2015-05-24 (×14): qty 1

## 2015-05-24 MED ORDER — HYDROCODONE-ACETAMINOPHEN 5-325 MG PO TABS
1.0000 | ORAL_TABLET | Freq: Three times a day (TID) | ORAL | Status: DC | PRN
  Administered 2015-05-24 – 2015-06-02 (×18): 1 via ORAL
  Filled 2015-05-24 (×18): qty 1

## 2015-05-24 MED ORDER — ACETAMINOPHEN 500 MG PO TABS: 1000.0000 mg | ORAL_TABLET | Freq: Four times a day (QID) | ORAL | Status: DC | PRN

## 2015-05-24 MED ORDER — ALBUTEROL SULFATE HFA 108 (90 BASE) MCG/ACT IN AERS
2.0000 | INHALATION_SPRAY | Freq: Three times a day (TID) | RESPIRATORY_TRACT | Status: DC
Start: 2015-05-24 — End: 2015-05-24
  Filled 2015-05-24 (×2): qty 6.7

## 2015-05-24 MED ORDER — LEVOTHYROXINE SODIUM 75 MCG PO TABS
75.0000 ug | ORAL_TABLET | Freq: Every day | ORAL | Status: DC
  Administered 2015-05-25 – 2015-06-02 (×9): 75 ug via ORAL
  Filled 2015-05-24 (×13): qty 1

## 2015-05-24 MED ORDER — MAGNESIUM HYDROXIDE 400 MG/5ML PO SUSP: 30.0000 mL | Freq: Every day | ORAL | Status: DC | PRN

## 2015-05-24 MED ORDER — NITROGLYCERIN 0.4 MG SL SUBL: 0.4000 mg | SUBLINGUAL_TABLET | SUBLINGUAL | Status: DC | PRN

## 2015-05-24 MED ORDER — ALBUTEROL SULFATE HFA 108 (90 BASE) MCG/ACT IN AERS
2.0000 | INHALATION_SPRAY | Freq: Three times a day (TID) | RESPIRATORY_TRACT | Status: DC | PRN
  Administered 2015-05-29 – 2015-05-30 (×2): 2 via RESPIRATORY_TRACT
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
## 2015-05-24 NOTE — ED Notes (Signed)
Patient has 2 bags of clothing behind the Triage nursing station. Bags and Patient have been wanded by security.

## 2015-05-24 NOTE — ED Notes (Signed)
A call placed to [facility] for transportation to [facility].

## 2015-05-24 NOTE — Progress Notes (Signed)
Adult Psychoeducational Group Note  Date:  05/24/2015 Time:  8:20 PM  Group Topic/Focus:  Wrap-Up Group:   The focus of this group is to help patients review their daily goal of treatment and discuss progress on daily workbooks.  Participation Level:  Did Not Attend  Pt was in bed during wrap-up group.    Lincoln Brigham 05/24/2015, 9:23 PM
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
## 2015-05-24 NOTE — Tx Team (Signed)
Initial Interdisciplinary Treatment Plan   PATIENT STRESSORS: Health problems Marital or family conflict Recent move to the area   PATIENT STRENGTHS: Communication skills General fund of knowledge   PROBLEM LIST: Problem List/Patient Goals Date to be addressed Date deferred Reason deferred Estimated date of resolution  Depression 05/24/15     Suicidal ideation 05/24/15     "Get this anger and depression under control" 05/24/15     "Get my head together" 05/24/15                                    DISCHARGE CRITERIA:  Need for constant or close observation no longer present Verbal commitment to aftercare and medication compliance  PRELIMINARY DISCHARGE PLAN: Outpatient therapy Medication management  PATIENT/FAMIILY INVOLVEMENT: This treatment plan has been presented to and reviewed with the patient, Gary Kirk.  The patient and family have been given the opportunity to ask questions and make suggestions.  Barbette Or Rony Ratz 05/24/2015, 3:31 PM

## 2015-05-25 DIAGNOSIS — F332 Major depressive disorder, recurrent severe without psychotic features: Principal | ICD-10-CM

## 2015-05-25 MED ORDER — MIRTAZAPINE 15 MG PO TABS
15.0000 mg | ORAL_TABLET | Freq: Every day | ORAL | Status: DC
  Administered 2015-05-25 – 2015-05-28 (×4): 15 mg via ORAL
  Filled 2015-05-25 (×5): qty 1

## 2015-05-25 MED ORDER — CITALOPRAM HYDROBROMIDE 10 MG PO TABS: 10.0000 mg | ORAL_TABLET | Freq: Every day | ORAL | Status: DC

## 2015-05-25 NOTE — Progress Notes (Signed)
D: Patient presents with flat, blunted affect.  He is pleasant with a irritable undertone.  Patient was isolating to room this morning.  Informed patient will get him a hospital bed today due to chronic back pain.  He remains passively suicidal and contracts for safety on the unit.  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  He rates his depression as  4; anxiety as a 5; hopelessness as a 3.  His goal is to "make it through the day."  Patient was concerned about his medications reordered and "being correct."   A: Continue to monitor medication management and MD orders.  Safety checks completed every 15 minutes per protocol.  Offer support and encouragement as needed. R: Patient is receptive to staff; his behavior is appropriate.

## 2015-05-25 NOTE — Progress Notes (Signed)
Adult Psychoeducational Group Note  Date:  05/25/2015 Time:  8:25 PM  Group Topic/Focus:  Wrap-Up Group:   The focus of this group is to help patients review their daily goal of treatment and discuss progress on daily workbooks.  Participation Level:  Did Not Attend  Pt was in bed during wrap-up group.    Gary Kirk 05/25/2015, 9:34 PM

## 2015-05-25 NOTE — Progress Notes (Signed)
Recreation Therapy Notes  Animal-Assisted Activity (AAA) Program Checklist/Progress Notes Patient Eligibility Criteria Checklist & Daily Group note for Rec Tx Intervention  Date: 03.28.2017 Time: 2:45pm Location: [address]   AAA/T Program Assumption of Risk Form signed by Teacher, music or Parent Legal Guardian Yes.    Patient is free of allergies or sever asthma Yes.    Patient reports no fear of animals Yes.    Patient reports no history of cruelty to animals Yes.    Patient understands his/her participation is voluntary Yes.    Behavioral Response: Did not attend.    Laureen Ochs Mireyah Chervenak, LRT/CTRS       Lane Hacker 05/25/2015 3:23 PM

## 2015-05-25 NOTE — Tx Team (Signed)
Interdisciplinary Treatment Plan Update (Adult) Date: 05/25/2015   Date: 05/25/2015 11:29 AM  Progress in Treatment:  Attending groups: Pt is new to milieu, continuing to assess   Participating in groups: Pt is new to milieu, continuing to assess   Taking medication as prescribed: Yes  Tolerating medication: Yes  Family/Significant othe contact made: No, CSW attempting t make contact with brother Patient understands diagnosis: Yes AEB seeking help with depression and anxiety Discussing patient identified problems/goals with staff: Yes  Medical problems stabilized or resolved: Yes  Denies suicidal/homicidal ideation: No, Pt endorses passive SI Patient has not harmed self or Others: Yes   New problem(s) identified: None identified at this time.   Discharge Plan or Barriers: Pt will return home and follow-up with outpatient resources  Additional comments:  Patient and CSW reviewed pt's identified goals and treatment plan. Patient verbalized understanding and agreed to treatment plan. CSW reviewed [facility] "Discharge Process and Patient Involvement" Form. Pt verbalized understanding of information provided and signed form.   Reason for Continuation of Hospitalization:  Anxiety Depression Medication stabilization Suicidal ideation  Estimated length of stay: 3-5 days  Review of initial/current patient goals per problem list:   1.  Goal(s): Patient will participate in aftercare plan  Met:  Yes  Target date: 3-5 days from date of admission   As evidenced by: Patient will participate within aftercare plan AEB aftercare provider and housing plan at discharge being identified.  05/25/15: Pt will return home and follow-up with outpatient resources   2.  Goal (s): Patient will exhibit decreased depressive symptoms and suicidal ideations.  Met:  Progressing  Target date: 3-5 days from date of admission   As evidenced by: Patient will utilize self rating of depression at 3 or below and  demonstrate decreased signs of depression or be deemed stable for discharge by MD.  05/25/15: Pt rates depression at 4/10; endorses passive SI  3.  Goal(s): Patient will demonstrate decreased signs and symptoms of anxiety.  Met:  Progressing  Target date: 3-5 days from date of admission   As evidenced by: Patient will utilize self rating of anxiety at 3 or below and demonstrated decreased signs of anxiety, or be deemed stable for discharge by MD  05/25/15: Pt rates anxiety at 5/10  Attendees:  Patient:    Family:    Physician: Dr. Parke Poisson, MD  05/25/2015 11:29 AM  Nursing: Lars Pinks, RN Case manager  05/25/2015 11:29 AM  Clinical Social Worker [name], [place] 05/25/2015 11:29 AM  Other: Tilden Fossa, [place] 05/25/2015 11:29 AM  Clinical: Marcella Dubs, RN; Mayra Neer, RN 05/25/2015 11:29 AM  Other: , RN Charge Nurse 05/25/2015 11:29 AM  Other: Hilda Lias, [place], [place] Work [phone]

## 2015-05-25 NOTE — Progress Notes (Signed)
Pt reports he is new to the unit this afternoon.  He states he is still having some passive suicidal thoughts, but can contract for safety on the unit.  He denies HI/AVH.  He has spent most of the time this evening in his room, but did come to the dayroom for a short time.  He was concerned that his home meds be reordered.  Writer spoke with the evening provider about pt's home meds as all of his meds had not been addressed during the day shift.  Provider reordered pt's meds, and pt was informed.  Writer also discovered that pt has been dx with sleep apnea and has a c-pap at home, but he does not use it, stating that he cannot get used to the mask on his face.  Pt was encouraged to make his needs known to staff.  Support and encouragement offered.  Discharge plans are in process.  Safety maintained with q15 minute checks.

## 2015-05-25 NOTE — H&P (Addendum)
Psychiatric Admission Assessment Adult  Patient Identification: Gary Kirk MRN:  [id number] Date of Evaluation:  05/25/2015 Chief Complaint:   " I feel like I don't care anymore "  Principal Diagnosis:  Major Depression, Recurrent, Severe, no Psychotic Symptoms  Diagnosis:   Patient Active Problem List   Diagnosis Date Noted  . Severe recurrent major depression without psychotic features ([place]) [F33.2] 05/24/2015  . MDD (major depressive disorder) ([place]) [F32.9] 05/24/2015  . MDD (major depressive disorder), recurrent episode, severe ([place]) [F33.2] 05/24/2015   History of Present Illness:: 50 year old man, who reports that over the last month or two he has been feeling depressed, sad, and also vaguely angry . States " it's like I don't care anymore ". He has been having suicide ideations, such as thinking of walking into traffic . He states he has also been easily irritated , aggravated, and " feeling like I could snap".  States he has been facing some chronic stressors, to include being on disability, feeling lonely, and feeling frequently criticized by his brother's wife ( lives with brother and his wife )  Associated Signs/Symptoms: Depression Symptoms:  depressed mood, anhedonia, insomnia, suicidal thoughts with specific plan, loss of energy/fatigue, decreased sense of self esteem  (Hypo) Manic Symptoms:  Irritability  Anxiety Symptoms:  Reports excessive worrying and has had some panic attacks  Psychotic Symptoms:  Denies  PTSD Symptoms: Does not endorse  Total Time spent with patient: 45 minutes  Past Psychiatric History:  Two prior psychiatric admissions , first one for suicide attempt at age 30, and a more recent one a few months ago for depression. Does not describe any clear hypomanic or manic symptoms , but does report some irritability as part of depression .  Denies history of psychosis.   Is the patient at risk to self? Yes.    Has the patient been a risk to self  in the past 6 months? Yes.    Has the patient been a risk to self within the distant past? Yes.    Is the patient a risk to others? No.  Has the patient been a risk to others in the past 6 months? No.  Has the patient been a risk to others within the distant past? No.   Prior Inpatient Therapy:  yes- see above  Prior Outpatient Therapy:  none- PCP had recently prescribed antidepressant .   Alcohol Screening: 1. How often do you have a drink containing alcohol?: Never 9. Have you or someone else been injured as a result of your drinking?: No 10. Has a relative or friend or a doctor or another health worker been concerned about your drinking or suggested you cut down?: No Alcohol Use Disorder Identification Test Final Score (AUDIT): 0 Brief Intervention: AUDIT score less than 7 or less-screening does not suggest unhealthy drinking-brief intervention not indicated Substance Abuse History in the last 12 months:  Denies any alcohol or drug abuse  Consequences of Substance Abuse: Denies  Previous Psychotropic Medications: in the past tried Wellbutrin XL, but felt it was not well tolerated due to GI side effects Zoloft caused him to feel tremulous . PCP had started Celexa earlier this week , but has only taken one tablet so far prior to admission Psychological Evaluations: No  Past Medical History:  Past Medical History  Diagnosis Date  . Hypertension   . CHF (congestive heart failure) ([place])   . A-fib ([place])   . COPD (chronic obstructive pulmonary disease) ([place])  Past Surgical History  Procedure Laterality Date  . Cardiac defibrillator placement     Family History: father passed away from cancer when he was 50, mother passed away 06/19/2006, three siblings, one brother died in an MVA Family Psychiatric  History: History of depression in extended family, one uncle committed suicide, father was alcoholic , brother alcoholic  Tobacco Screening: does not smoke  Social History: separated, no  children, lives with brother and brother's wife, on disability, denies legal issues   History  Alcohol Use No     History  Drug Use No    Additional Social History:      Pain Medications: Denies abuse Prescriptions: Denies abuse Over the Counter: Denies abuse History of alcohol / drug use?: Yes (Denies any current usage in over 2 years) Longest period of sobriety (when/how long): 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 Tramadol Shortness Of Breath  . Motrin [Ibuprofen] Other (See Comments)    Spit up blood   Lab Results:  Results for orders placed or performed during the hospital encounter of 05/24/15 (from the past 48 hour(s))  Urinalysis, Routine w reflex microscopic (not at [facility])     Status: Abnormal   Collection Time: 05/24/15  4:05 PM  Result Value Ref Range   Color, Urine YELLOW YELLOW   APPearance CLEAR CLEAR   Specific Gravity, Urine 1.011 1.005 - 1.030   pH 5.0 5.0 - 8.0   Glucose, UA NEGATIVE NEGATIVE mg/dL   Hgb urine dipstick TRACE (A) NEGATIVE   Bilirubin Urine NEGATIVE NEGATIVE   Ketones, ur NEGATIVE NEGATIVE mg/dL   Protein, ur NEGATIVE NEGATIVE mg/dL   Nitrite NEGATIVE NEGATIVE   Leukocytes, UA NEGATIVE NEGATIVE    Comment: Performed at [facility]  Urine microscopic-add on     Status: Abnormal   Collection Time: 05/24/15  4:05 PM  Result Value Ref Range   Squamous Epithelial / LPF 0-5 (A) NONE SEEN   WBC, UA NONE SEEN 0 - 5 WBC/hpf   RBC / HPF 0-5 0 - 5 RBC/hpf   Bacteria, UA NONE SEEN NONE SEEN   Casts HYALINE CASTS (A) NEGATIVE    Comment: Performed at [facility]    Blood Alcohol level:  Lab Results  Component Value Date   [facility] <5 [id number]    Metabolic Disorder Labs:  No results found for: HGBA1C, MPG No results found for: PROLACTIN No results found for: CHOL, TRIG, HDL, CHOLHDL, VLDL, LDLCALC  Current Medications: Current  Facility-Administered Medications  Medication Dose Route Frequency Provider Last Rate Last Dose  . acetaminophen (TYLENOL) tablet 1,000 mg  1,000 mg Oral Q6H PRN Laverle Hobby, PA-C      . albuterol (PROVENTIL HFA;VENTOLIN HFA) 108 (90 Base) MCG/ACT inhaler 2 puff  2 puff Inhalation Q8H PRN Laverle Hobby, PA-C      . alum & mag hydroxide-simeth (MAALOX/MYLANTA) 200-200-20 MG/5ML suspension 30 mL  30 mL Oral Q4H PRN Derrill Center, NP      . amiodarone (PACERONE) tablet 400 mg  400 mg Oral Daily Kerrie Buffalo, NP   400 mg at 05/25/15 0814  . apixaban (ELIQUIS) tablet 5 mg  5 mg Oral BID Laverle Hobby, PA-C   5 mg at 05/25/15 0815  . carvedilol (COREG) tablet 25 mg  25 mg Oral BID Laverle Hobby, PA-C   25 mg at 05/25/15 0815  . furosemide (LASIX) tablet 40  mg  40 mg Oral Daily Kerrie Buffalo, NP   40 mg at 05/25/15 0815  . HYDROcodone-acetaminophen (NORCO/VICODIN) 5-325 MG per tablet 1 tablet  1 tablet Oral Q8H PRN Kerrie Buffalo, NP   1 tablet at 05/25/15 0815  . levothyroxine (SYNTHROID, LEVOTHROID) tablet 75 mcg  75 mcg Oral QAC breakfast Kerrie Buffalo, NP   75 mcg at 05/25/15 [id number]  . magnesium hydroxide (MILK OF MAGNESIA) suspension 30 mL  30 mL Oral Daily PRN Derrill Center, NP      . nitroGLYCERIN (NITROSTAT) SL tablet 0.4 mg  0.4 mg Sublingual Q5 min PRN Laverle Hobby, PA-C      . potassium chloride SA (K-DUR,KLOR-CON) CR tablet 20 mEq  20 mEq Oral Daily Laverle Hobby, PA-C   20 mEq at 05/25/15 0815  . tiotropium (SPIRIVA) inhalation capsule 18 mcg  18 mcg Inhalation Daily PRN Laverle Hobby, PA-C      . traZODone (DESYREL) tablet 100 mg  100 mg Oral QHS,MR X 1 Laverle Hobby, PA-C   100 mg at 05/24/15 2236   PTA Medications: Prescriptions prior to admission  Medication Sig Dispense Refill Last Dose  . amiodarone (PACERONE) 400 MG tablet Take 400 mg by mouth daily.   05/23/2015 at Unknown time  . furosemide (LASIX) 40 MG tablet Take 40 mg by mouth daily.  5 Past Week at  Unknown time  . HYDROcodone-acetaminophen (NORCO/VICODIN) 5-325 MG tablet Take 1 tablet by mouth every 8 (eight) hours as needed for moderate pain. Please note:  This NP called [facility] on [address] [place] to verifiy dose and frequency.   Past Week at Unknown time  . levothyroxine (SYNTHROID, LEVOTHROID) 75 MCG tablet Take 75 mcg by mouth daily before breakfast.   Past Week at Unknown time  . acetaminophen (TYLENOL) 500 MG tablet Take 1,000 mg by mouth every 6 (six) hours as needed for mild pain, moderate pain, fever or headache.   05/22/2015 at Unknown time  . albuterol (PROAIR HFA) 108 (90 Base) MCG/ACT inhaler Inhale 2 puffs into the lungs every 8 (eight) hours.    05/23/2015 at Unknown time  . albuterol (PROVENTIL) (2.5 MG/3ML) 0.083% nebulizer solution 2.5 mg.   Past Week at Unknown time  . aspirin EC 81 MG tablet Take 81 mg by mouth daily.   Past Week at Unknown time  . carvedilol (COREG) 25 MG tablet Take 25 mg by mouth 2 (two) times daily.   05/22/2015 at 1900  . ELIQUIS 5 MG TABS tablet Take 5 mg by mouth 2 (two) times daily.  0 05/23/2015 at 1800  . nitroGLYCERIN (NITROSTAT) 0.4 MG SL tablet Place 0.4 mg under the tongue every 5 (five) minutes as needed for chest pain.    couple of months  . potassium chloride SA (K-DUR,KLOR-CON) 20 MEQ tablet Take 20 mEq by mouth daily.   Past Week at Unknown time  . tiotropium (SPIRIVA HANDIHALER) 18 MCG inhalation capsule Place 18 mcg into inhaler and inhale daily as needed (for shortness of breath).    Past Week at Unknown time    Musculoskeletal: Strength & Muscle Tone: within normal limits Gait & Station: normal Patient leans: N/A  Psychiatric Specialty Exam: Physical Exam  Review of Systems  Constitutional: Negative.   Eyes: Negative.   Respiratory: Negative for cough and wheezing.   Cardiovascular: Negative.  Negative for chest pain.  Gastrointestinal: Negative for nausea, vomiting and blood in stool.  Genitourinary: Negative.    Musculoskeletal: Positive  for back pain.  Skin: Negative.   Neurological: Positive for headaches. Negative for seizures.  Endo/Heme/Allergies: Negative.   Psychiatric/Behavioral: Positive for depression and suicidal ideas.    Blood pressure 134/109, pulse 89, temperature 97.6 F (36.4 C), temperature source Oral, resp. rate 16, height 6' (1.829 m), weight 330 lb (149.687 kg), SpO2 97 %.Body mass index is 44.75 kg/(m^2).  General Appearance: Fairly Groomed  Engineer, water::  Fair  Speech:  Normal Rate  Volume:  Normal  Mood:  Depressed and slightly irritable   Affect:  Constricted and Depressed  Thought Process:  Linear  Orientation:  Other:  fully alert and attentive   Thought Content:  denies hallucinations, no delusions   Suicidal Thoughts:  at this time denies suicidal plan or intention and contracts for safety on the unit   Homicidal Thoughts:  No states he feels irritable and " like I could snap", but denies any plan or intention of violence towards anyone   Memory:  recent and remote grossly intact   Judgement:  Fair  Insight:  Fair  Psychomotor Activity:  Decreased  Concentration:  Good  Recall:  Good  Fund of Knowledge:Good  Language: Good  Akathisia:  Negative  Handed:  Right  AIMS (if indicated):     Assets:  Desire for Improvement Resilience  ADL's:  Intact  Cognition: WNL  Sleep:  Number of Hours: 6.75     Treatment Plan Summary: Daily contact with patient to assess and evaluate symptoms and progress in treatment, Medication management, Plan inpatient admission  and medications as below   Observation Level/Precautions:  15 minute checks  Laboratory:  as needed   Psychotherapy:  Milieu, support   Medications:  Based  On cardiac history and cardiac medications he is on, we discussed different treatment options - agrees to [place] which may be associated with less potential drug drug interactions and may help his insomnia, which has been significant. Start 15 mgrs  QHS   Consultations:  As needed   Discharge Concerns:  -   Estimated LOS: 5 days   Other:     I certify that inpatient services furnished can reasonably be expected to improve the patient's condition.    Neita Garnet, MD 3/28/20179:09 AM

## 2015-05-25 NOTE — Plan of Care (Signed)
Problem: Ineffective individual coping Goal: LTG: Patient will report a decrease in negative feelings Outcome: Not Progressing Patient continues to have depressive symptoms and feelings of hopelessness.

## 2015-05-25 NOTE — Progress Notes (Signed)
Pt attended spiritual care group on grief and loss facilitated by chaplain Alysiah Suppa   Group opened with brief discussion and psycho-social ed around grief and loss in relationships and in relation to self - identifying life patterns, circumstances, changes that cause losses. Established group norm of speaking from own life experience. Group goal of establishing open and affirming space for members to share loss and experience with grief, normalize grief experience and provide psycho social education and grief support.     

## 2015-05-25 NOTE — BHH Group Notes (Signed)
[place] LCSW Group Therapy  05/25/2015   1:15 PM   Type of Therapy:  Group Therapy  Participation Level:  Minimal  Participation Quality:  Attentive, Sharing and Supportive  Affect:  Depressed and Flat  Cognitive:  Alert and Oriented  Insight:  Developing/Improving and Engaged  Engagement in Therapy:  Developing/Improving and Engaged  Modes of Intervention:  Clarification, Confrontation, Discussion, Education, Exploration, Limit-setting, Orientation, Problem-solving, Rapport Building, Art therapist, Socialization and Support  Summary of Progress/Problems: The topic for group therapy was feelings about diagnosis.  Pt actively participated in group discussion on their past and current diagnosis and how they feel towards this.  Pt also identified how society and family members judge them, based on their diagnosis as well as stereotypes and stigmas.  Patient expressed feeling nervous about being admitted to the hospital. He participated minimally in discussion and left group early without returning.   Tilden Fossa, MSW, [place] Clinical Social Worker [facility] [phone]

## 2015-05-25 NOTE — BHH Group Notes (Signed)
[place] Group Notes:  (Nursing/MHT/Case Management/Adjunct)  Date:  05/25/2015  Time: 0900 am  Type of Therapy:  Psychoeducational Skills  Participation Level:  Did Not Attend   Patient invited; declined to attend.  [name] 05/25/2015, 10:46 AM
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
## 2015-05-25 NOTE — BHH Suicide Risk Assessment (Signed)
[facility] Admission Suicide Risk Assessment   Nursing information obtained from:   patient and chart  Demographic factors:   50 year old man, on disability, lives with brother and brother's wife  Current Mental Status:   see below  Loss Factors:   limited support network, disability, medical issues  Historical Factors:   depression  Risk Reduction Factors:   resilience   Total Time spent with patient: 45 minutes Principal Problem:  Major Depression  Diagnosis:   Patient Active Problem List   Diagnosis Date Noted  . Severe recurrent major depression without psychotic features ([place]) [F33.2] 05/24/2015  . MDD (major depressive disorder) ([place]) [F32.9] 05/24/2015  . MDD (major depressive disorder), recurrent episode, severe ([place]) [F33.2] 05/24/2015     Continued Clinical Symptoms:  Alcohol Use Disorder Identification Test Final Score (AUDIT): 0 The "Alcohol Use Disorders Identification Test", Guidelines for Use in Primary Care, Second Edition.  World Pharmacologist [facility]). Score between 0-7:  no or low risk or alcohol related problems. Score between 8-15:  moderate risk of alcohol related problems. Score between 16-19:  high risk of alcohol related problems. Score 20 or above:  warrants further diagnostic evaluation for alcohol dependence and treatment.   CLINICAL FACTORS:  50 year old male, on disability, history of CAD, admitted due to worsening depression, sadness, suicidal ideations, as well as vague irritability.    Psychiatric Specialty Exam: ROS  Blood pressure 134/109, pulse 89, temperature 97.6 F (36.4 C), temperature source Oral, resp. rate 16, height 6' (1.829 m), weight 330 lb (149.687 kg), SpO2 97 %.Body mass index is 44.75 kg/(m^2).   see admit note MSE                                                       COGNITIVE FEATURES THAT CONTRIBUTE TO RISK:  Closed-mindedness and Loss of executive function    SUICIDE RISK:   Moderate:  Frequent  suicidal ideation with limited intensity, and duration, some specificity in terms of plans, no associated intent, good self-control, limited dysphoria/symptomatology, some risk factors present, and identifiable protective factors, including available and accessible social support.  PLAN OF CARE: Patient will be admitted to inpatient psychiatric unit for stabilization and safety. Will provide and encourage milieu participation. Provide medication management and maked adjustments as needed.  Will follow daily.    I certify that inpatient services furnished can reasonably be expected to improve the patient's condition.   Neita Garnet, MD 05/25/2015, 2:05 PM

## 2015-05-26 DIAGNOSIS — F332 Major depressive disorder, recurrent severe without psychotic features: Secondary | ICD-10-CM | POA: Insufficient documentation

## 2015-05-26 NOTE — BHH Group Notes (Signed)
[facility] LCSW Aftercare Discharge Planning Group Note  05/26/2015 8:45 AM  Participation Quality: Alert, Appropriate and Oriented  Mood/Affect: "I'm here"; Flat affect  Depression Rating: 3  Anxiety Rating: 5  Thoughts of Suicide: Pt denies SI/HI  Will you contract for safety? Yes  Current AVH: Pt denies  Plan for Discharge/Comments: Pt attended discharge planning group and actively participated in group. CSW discussed suicide prevention education with the group and encouraged them to discuss discharge planning and any relevant barriers. Pt presents with flat affect, however rates depression at low levels  Transportation Means: Pt reports access to transportation  Supports: No supports mentioned at this time  [name], [name] 05/26/2015 9:38 AM

## 2015-05-26 NOTE — Progress Notes (Signed)
[facility] MD Progress Note  05/26/2015 2:32 PM Gary Kirk  MRN:  [id number] Subjective: Continues to report feeling depressed, sad, and also vaguely irritable, due to which he states he prefers to keep to self. Today also reporting a history suggestive of social anxiety, stating that he often feels uncomfortable in group sessions, because he feels " everyone is looking at me, judging me ".  At this time denies medication side effects. Objective : I have discussed case with treatment team and have met with patient. Remains depressed, sad- today denies any suicidal ideations , and contracts for safety on unit . As noted, tends to isolate on unit, and states he feels uncomfortable and vaguely irritable in group settings. At this time does not endorse medication side effects. Although reports vague irritability, does not endorse other symptoms that might suggest mixed episode, and presents sad rather than angry or irritable .   Principal Problem: MDD , severe, no psychotic features  Diagnosis:   Patient Active Problem List   Diagnosis Date Noted  . Severe recurrent major depression without psychotic features ([place]) [F33.2] 05/24/2015  . MDD (major depressive disorder) ([place]) [F32.9] 05/24/2015  . MDD (major depressive disorder), recurrent episode, severe ([place]) [F33.2] 05/24/2015   Total Time spent with patient: 25 minutes     Past Medical History:  Past Medical History  Diagnosis Date  . Hypertension   . CHF (congestive heart failure) ([place])   . A-fib ([place])   . COPD (chronic obstructive pulmonary disease) [facility])     Past Surgical History  Procedure Laterality Date  . Cardiac defibrillator placement     Family History: History reviewed. No pertinent family history.  Social History:  History  Alcohol Use No     History  Drug Use No    Social History   Social History  . Marital Status: Single    Spouse Name: N/A  . Number of Children: N/A  . Years of Education: N/A   Social  History Main Topics  . Smoking status: Former Smoker    Quit date: 04/29/2014  . Smokeless tobacco: None  . Alcohol Use: No  . Drug Use: No  . Sexual Activity: Not Asked   Other Topics Concern  . None   Social History Narrative   Additional Social History:    Pain Medications: Denies abuse Prescriptions: Denies abuse Over the Counter: Denies abuse History of alcohol / drug use?: Yes (Denies any current usage in over 2 years) Longest period of sobriety (when/how long): Two year sober Withdrawal Symptoms: Other (Comment) (None)  Sleep: improved   Appetite:  Good  Current Medications: Current Facility-Administered Medications  Medication Dose Route Frequency Provider Last Rate Last Dose  . acetaminophen (TYLENOL) tablet 1,000 mg  1,000 mg Oral Q6H PRN Laverle Hobby, PA-C      . albuterol (PROVENTIL HFA;VENTOLIN HFA) 108 (90 Base) MCG/ACT inhaler 2 puff  2 puff Inhalation Q8H PRN Laverle Hobby, PA-C      . alum & mag hydroxide-simeth (MAALOX/MYLANTA) 200-200-20 MG/5ML suspension 30 mL  30 mL Oral Q4H PRN Derrill Center, NP      . amiodarone (PACERONE) tablet 400 mg  400 mg Oral Daily Kerrie Buffalo, NP   400 mg at 05/26/15 0844  . apixaban (ELIQUIS) tablet 5 mg  5 mg Oral BID Laverle Hobby, PA-C   5 mg at 05/26/15 0845  . carvedilol (COREG) tablet 25 mg  25 mg Oral BID Laverle Hobby, PA-C  25 mg at 05/26/15 0844  . furosemide (LASIX) tablet 40 mg  40 mg Oral Daily Kerrie Buffalo, NP   40 mg at 05/26/15 0847  . HYDROcodone-acetaminophen (NORCO/VICODIN) 5-325 MG per tablet 1 tablet  1 tablet Oral Q8H PRN Kerrie Buffalo, NP   1 tablet at 05/26/15 0847  . levothyroxine (SYNTHROID, LEVOTHROID) tablet 75 mcg  75 mcg Oral QAC breakfast Kerrie Buffalo, NP   75 mcg at 05/26/15 1610  . magnesium hydroxide (MILK OF MAGNESIA) suspension 30 mL  30 mL Oral Daily PRN Derrill Center, NP      . mirtazapine (REMERON) tablet 15 mg  15 mg Oral QHS Jenne Campus, MD   15 mg at 05/25/15 2103   . nitroGLYCERIN (NITROSTAT) SL tablet 0.4 mg  0.4 mg Sublingual Q5 min PRN Laverle Hobby, PA-C      . potassium chloride SA (K-DUR,KLOR-CON) CR tablet 20 mEq  20 mEq Oral Daily Laverle Hobby, PA-C   20 mEq at 05/26/15 0844  . tiotropium (SPIRIVA) inhalation capsule 18 mcg  18 mcg Inhalation Daily PRN Laverle Hobby, PA-C      . traZODone (DESYREL) tablet 100 mg  100 mg Oral QHS,MR X 1 Laverle Hobby, PA-C   100 mg at 05/25/15 2103    Lab Results:  Results for orders placed or performed during the hospital encounter of 05/24/15 (from the past 48 hour(s))  Urinalysis, Routine w reflex microscopic (not at [facility])     Status: Abnormal   Collection Time: 05/24/15  4:05 PM  Result Value Ref Range   Color, Urine YELLOW YELLOW   APPearance CLEAR CLEAR   Specific Gravity, Urine 1.011 1.005 - 1.030   pH 5.0 5.0 - 8.0   Glucose, UA NEGATIVE NEGATIVE mg/dL   Hgb urine dipstick TRACE (A) NEGATIVE   Bilirubin Urine NEGATIVE NEGATIVE   Ketones, ur NEGATIVE NEGATIVE mg/dL   Protein, ur NEGATIVE NEGATIVE mg/dL   Nitrite NEGATIVE NEGATIVE   Leukocytes, UA NEGATIVE NEGATIVE    Comment: Performed at [facility]  Urine microscopic-add on     Status: Abnormal   Collection Time: 05/24/15  4:05 PM  Result Value Ref Range   Squamous Epithelial / LPF 0-5 (A) NONE SEEN   WBC, UA NONE SEEN 0 - 5 WBC/hpf   RBC / HPF 0-5 0 - 5 RBC/hpf   Bacteria, UA NONE SEEN NONE SEEN   Casts HYALINE CASTS (A) NEGATIVE    Comment: Performed at [facility]    Blood Alcohol level:  Lab Results  Component Value Date   [facility] <5 05/23/2015    Physical Findings: AIMS: Facial and Oral Movements Muscles of Facial Expression: None, normal Lips and Perioral Area: None, normal Jaw: None, normal Tongue: None, normal,Extremity Movements Upper (arms, wrists, hands, fingers): None, normal Lower (legs, knees, ankles, toes): None, normal, Trunk Movements Neck, shoulders, hips: None,  normal, Overall Severity Severity of abnormal movements (highest score from questions above): None, normal Incapacitation due to abnormal movements: None, normal Patient's awareness of abnormal movements (rate only patient's report): No Awareness, Dental Status Current problems with teeth and/or dentures?: No Does patient usually wear dentures?: No  CIWA:    COWS:     Musculoskeletal: Strength & Muscle Tone: within normal limits Gait & Station: normal Patient leans: N/A  Psychiatric Specialty Exam: ROS no headache, no chest pain, no shortness of breath   Blood pressure 113/74, pulse 89, temperature 97.4 F (36.3 C), temperature  source Oral, resp. rate 16, height 6' (1.829 m), weight 330 lb (149.687 kg), SpO2 97 %.Body mass index is 44.75 kg/(m^2).  General Appearance: Fairly Groomed  Engineer, water::  Good  Speech:  Normal Rate  Volume:  Normal  Mood:  Depressed  Affect:  Constricted  Thought Process:  Linear  Orientation:  Other:  fully alert and attentive   Thought Content:  linear   Suicidal Thoughts:  No- at this time denies any suicidal ideations , and contracts for safety on the unit   Homicidal Thoughts:  No denies any violent ideations  Memory:  recent and remote grossly intact   Judgement:  Fair  Insight:  Fair  Psychomotor Activity:  Decreased  Concentration:  Good  Recall:  Good  Fund of Knowledge:Good  Language: Good  Akathisia:  Negative  Handed:  Right  AIMS (if indicated):     Assets:  Desire for Improvement Resilience  ADL's:  Fair   Cognition: WNL  Sleep:  Number of Hours: 6.5  Assessment - patient remains depressed, vaguely dysphoric, irritable. No suicidal ideations, and no psychotic symptoms. Tends to isolate,and attributes this at least in part to what appears to be underlying, long term social anxiety symptoms, with history of feeling judged and at center of attention when in group settings . Thus far tolerating Remeron well, and did sleep better last  night .  Treatment Plan Summary: Daily contact with patient to assess and evaluate symptoms and progress in treatment, Medication management, Plan inpatient admission  and medications as below  Encourage milieu participation to work on coping skills and symptom reduction  Continue Remeron 15 mgrs QHS for depression, insomnia  Continue Synthroid for management of hypothyroidism  Treatment team working on disposition planning   Neita Garnet, MD 05/26/2015, 2:32 PM

## 2015-05-26 NOTE — Progress Notes (Signed)
Recreation Therapy Notes  Date: 03.29.2017 Time: 9:30am Location: [address] Group Room   Group Topic: Stress Management  Goal Area(s) Addresses:  Patient will actively participate in stress management techniques presented during session.   Behavioral Response: Did not attend.  Laureen Ochs Braxon Suder, LRT/CTRS       Caliann Leckrone L 05/26/2015 9:56 AM

## 2015-05-26 NOTE — BHH Group Notes (Signed)
[facility] LCSW Group Therapy 05/26/2015 1:15 PM  Type of Therapy: Group Therapy- Emotion Regulation  Participation Level: Minimal  Participation Quality:  Reserved but Attentive  Affect: Flat  Cognitive: Alert and Oriented   Insight:  Improving  Engagement in Therapy: Minimal  Modes of Intervention: Clarification, Confrontation, Discussion, Education, Exploration, Limit-setting, Orientation, Problem-solving, Rapport Building, Art therapist, Socialization and Support  Summary of Progress/Problems: The topic for group today was emotional regulation. This group focused on both positive and negative emotion identification and allowed group members to process ways to identify feelings, regulate negative emotions, and find healthy ways to manage internal/external emotions. Group members were asked to reflect on a time when their reaction to an emotion led to a negative outcome and explored how alternative responses using emotion regulation would have benefited them. Group members were also asked to discuss a time when emotion regulation was utilized when a negative emotion was experienced. Pt was attentive to group discussion, and did acknowledge that difficulty with regulation of sadness and anger as causes for admission.   [name], [place] 05/26/2015 5:43 PM

## 2015-05-26 NOTE — Progress Notes (Signed)
D-pt is flat affect and blunt with resposes, pt seems hopeless A-pt took his medications and attended group R-cont to monitor for safety

## 2015-05-26 NOTE — Progress Notes (Signed)
Pt seems to be in a better mood this evening.  Writer discussed med changes made today with patient, and he voiced understanding.  He still has passive suicidal thoughts, but can contract for safety.  He denies HI/AVH.  He has talked with the MD today, but does not feel comfortable in a group setting.  He makes his needs known to staff.  He plans to return to his brother's home at discharge.  Discharge plans are in process.  Support and encouragement offered.  Safety maintained with q15 minute checks.

## 2015-05-27 LAB — BASIC METABOLIC PANEL
ANION GAP: 12 (ref 5–15)
BUN: 20 mg/dL (ref 6–20)
CALCIUM: 8.7 mg/dL — AB (ref 8.9–10.3)
CHLORIDE: 103 mmol/L (ref 101–111)
CO2: 24 mmol/L (ref 22–32)
CREATININE: 1.5 mg/dL — AB (ref 0.61–1.24)
GFR calc non Af Amer: 53 mL/min — ABNORMAL LOW (ref >60)
Glucose, Bld: 118 mg/dL — ABNORMAL HIGH (ref 65–99)
Potassium: 3.8 mmol/L (ref 3.5–5.1)
SODIUM: 139 mmol/L (ref 135–145)

## 2015-05-27 LAB — MAGNESIUM: MAGNESIUM: 1.7 mg/dL (ref 1.7–2.4)

## 2015-05-27 MED ORDER — TRAZODONE HCL 50 MG PO TABS
50.0000 mg | ORAL_TABLET | Freq: Every evening | ORAL | Status: DC | PRN
  Administered 2015-05-27 – 2015-05-29 (×3): 50 mg via ORAL
  Filled 2015-05-27 (×3): qty 1

## 2015-05-27 MED ORDER — ARIPIPRAZOLE 2 MG PO TABS
2.0000 mg | ORAL_TABLET | Freq: Every day | ORAL | Status: DC
  Administered 2015-05-27 – 2015-05-30 (×4): 2 mg via ORAL
  Filled 2015-05-27 (×6): qty 1

## 2015-05-27 MED ORDER — MAGNESIUM OXIDE 400 (241.3 MG) MG PO TABS
400.0000 mg | ORAL_TABLET | Freq: Two times a day (BID) | ORAL | Status: AC
  Administered 2015-05-27 – 2015-05-30 (×6): 400 mg via ORAL
  Filled 2015-05-27 (×8): qty 1

## 2015-05-27 NOTE — BHH Group Notes (Signed)
[facility] Group Therapy 05/27/2015 1:15pm  Type of Therapy: Mental [employer] Presentation  Pt did not attend, declined invitation.    [name], [place] 05/27/2015 2:30 PM

## 2015-05-27 NOTE — Progress Notes (Signed)
D: Patient has irritable edge this morning.  Asked if he was having SI, he stated, "I'm not now; not yet anyway."  Patient attends groups with minimal participation.  He isolates to his room throughout the day.  His affect is blunted; mood is depressed and sad.  His only physical complaint is back pain.  He denies HI/AVH.  His goal today is to "try to get over feeling wound up."  Patient appears to have some social anxiety and elects to stay to himself.  He is withdrawn and isolative. A: Continue to monitor medication management and MD orders.  Safety checks completed every 15 minutes per protocol.  Offer support and encouragement as needed. R: Patient is receptive to staff; he has minimal interaction with staff and peers.

## 2015-05-27 NOTE — Progress Notes (Signed)
Pt has spent most of the evening in bed.  He tells Probation officer that other patients on the hall are "getting on my nerves" and to keep from doing something impulsively, he has been retreating to his room.  He recognizes that he is easily irritated and does not want to do anything to jeopardize being discharged in a timely manner.  He is still having passive, suicidal thoughts that come and go.  He denies HI/AVH.  He has been able to remain appropriate and cooperative on the unit.  He says he has gone to some groups.  Support and encouragement offered.  Pt makes his needs known to staff.  Discharge plans are in process, and pt plans to return to his brother's home at discharge.  Safety maintained with q15 minute checks.

## 2015-05-27 NOTE — BHH Group Notes (Signed)
[place] Group Notes:  (Nursing/MHT/Case Management/Adjunct)  Date:  05/27/2015  Time:  0900 am  Type of Therapy:  Psychoeducational Skills  Participation Level:  Did Not Attend  Invited to group; declined to attend.  [name] 05/27/2015, 3:33 PM

## 2015-05-27 NOTE — Progress Notes (Addendum)
Patient ID: Gary Kirk, male   DOB: 05/16/1965, 50 y.o.   MRN: [id number] [facility] MD Progress Note  05/27/2015 3:14 PM Gary Kirk  MRN:  [id number] Subjective:  Patient states " I am not feeling good today". Reports ongoing depression, sense of sadness, and subjective sense of irritability as well . Denies medication side effects at this time Objective : I have discussed case with treatment team and have met with patient. Patient remains isolative, sad, depressed, and reporting vague irritability, although not presenting with any agitation or any pressured or loud speech. He states he has little desire to participate in groups , he states partly due to irritability and partly due to social anxiety. States " it is about 50/50" when describing why he is keeping to self . At this time does not endorse medication side effects. He denies suicidal ideations, and contracts for safety on the unit . Also denies any hallucinations, and does not appear internally preoccupied   Principal Problem: MDD , severe, no psychotic features  Diagnosis:   Patient Active Problem List   Diagnosis Date Noted  . Severe episode of recurrent major depressive disorder, without psychotic features ([place]) [F33.2]   . Severe recurrent major depression without psychotic features ([place]) [F33.2] 05/24/2015  . MDD (major depressive disorder) ([place]) [F32.9] 05/24/2015  . MDD (major depressive disorder), recurrent episode, severe ([place]) [F33.2] 05/24/2015   Total Time spent with patient: 20 minutes     Past Medical History:  Past Medical History  Diagnosis Date  . Hypertension   . CHF (congestive heart failure) ([place])   . A-fib ([place])   . COPD (chronic obstructive pulmonary disease) [facility])     Past Surgical History  Procedure Laterality Date  . Cardiac defibrillator placement     Family History: History reviewed. No pertinent family history.  Social History:  History  Alcohol Use No     History  Drug Use No     Social History   Social History  . Marital Status: Single    Spouse Name: N/A  . Number of Children: N/A  . Years of Education: N/A   Social History Main Topics  . Smoking status: Former Smoker    Quit date: 04/29/2014  . Smokeless tobacco: None  . Alcohol Use: No  . Drug Use: No  . Sexual Activity: Not Asked   Other Topics Concern  . None   Social History Narrative   Additional Social History:    Pain Medications: Denies abuse Prescriptions: Denies abuse Over the Counter: Denies abuse History of alcohol / drug use?: Yes (Denies any current usage in over 2 years) Longest period of sobriety (when/how long): Two year sober Withdrawal Symptoms: Other (Comment) (None)  Sleep: improved   Appetite:  Good  Current Medications: Current Facility-Administered Medications  Medication Dose Route Frequency Provider Last Rate Last Dose  . acetaminophen (TYLENOL) tablet 1,000 mg  1,000 mg Oral Q6H PRN Laverle Hobby, PA-C      . albuterol (PROVENTIL HFA;VENTOLIN HFA) 108 (90 Base) MCG/ACT inhaler 2 puff  2 puff Inhalation Q8H PRN Laverle Hobby, PA-C      . alum & mag hydroxide-simeth (MAALOX/MYLANTA) 200-200-20 MG/5ML suspension 30 mL  30 mL Oral Q4H PRN Derrill Center, NP      . amiodarone (PACERONE) tablet 400 mg  400 mg Oral Daily Kerrie Buffalo, NP   400 mg at 05/27/15 0803  . apixaban (ELIQUIS) tablet 5 mg  5 mg Oral BID Maurine Minister  Simon, PA-C   5 mg at 05/27/15 0017  . carvedilol (COREG) tablet 25 mg  25 mg Oral BID Laverle Hobby, PA-C   25 mg at 05/27/15 4944  . furosemide (LASIX) tablet 40 mg  40 mg Oral Daily Kerrie Buffalo, NP   40 mg at 05/27/15 0803  . HYDROcodone-acetaminophen (NORCO/VICODIN) 5-325 MG per tablet 1 tablet  1 tablet Oral Q8H PRN Kerrie Buffalo, NP   1 tablet at 05/27/15 0803  . levothyroxine (SYNTHROID, LEVOTHROID) tablet 75 mcg  75 mcg Oral QAC breakfast Kerrie Buffalo, NP   75 mcg at 05/27/15 0631  . magnesium hydroxide (MILK OF MAGNESIA) suspension  30 mL  30 mL Oral Daily PRN Derrill Center, NP      . mirtazapine (REMERON) tablet 15 mg  15 mg Oral QHS Jenne Campus, MD   15 mg at 05/26/15 2104  . nitroGLYCERIN (NITROSTAT) SL tablet 0.4 mg  0.4 mg Sublingual Q5 min PRN Laverle Hobby, PA-C      . potassium chloride SA (K-DUR,KLOR-CON) CR tablet 20 mEq  20 mEq Oral Daily Laverle Hobby, PA-C   20 mEq at 05/27/15 0802  . tiotropium (SPIRIVA) inhalation capsule 18 mcg  18 mcg Inhalation Daily PRN Laverle Hobby, PA-C      . traZODone (DESYREL) tablet 100 mg  100 mg Oral QHS,MR X 1 Laverle Hobby, PA-C   100 mg at 05/26/15 2104    Lab Results:  No results found for this or any previous visit (from the past 48 hour(s)).  Blood Alcohol level:  Lab Results  Component Value Date   ETH <5 05/23/2015    Physical Findings: AIMS: Facial and Oral Movements Muscles of Facial Expression: None, normal Lips and Perioral Area: None, normal Jaw: None, normal Tongue: None, normal,Extremity Movements Upper (arms, wrists, hands, fingers): None, normal Lower (legs, knees, ankles, toes): None, normal, Trunk Movements Neck, shoulders, hips: None, normal, Overall Severity Severity of abnormal movements (highest score from questions above): None, normal Incapacitation due to abnormal movements: None, normal Patient's awareness of abnormal movements (rate only patient's report): No Awareness, Dental Status Current problems with teeth and/or dentures?: No Does patient usually wear dentures?: No  CIWA:    COWS:     Musculoskeletal: Strength & Muscle Tone: within normal limits Gait & Station: normal Patient leans: N/A  Psychiatric Specialty Exam: ROS no headache, no chest pain, no shortness of breath   Blood pressure 116/76, pulse 67, temperature 97.5 F (36.4 C), temperature source Oral, resp. rate 16, height 6' (1.829 m), weight 330 lb (149.687 kg), SpO2 97 %.Body mass index is 44.75 kg/(m^2).  General Appearance: Fairly Groomed  Chemical engineer::  Good  Speech:  Normal Rate  Volume:  Normal  Mood:  Depressed  Affect:  Remains constricted and dysphoric   Thought Process:  Linear  Orientation:  Other:  fully alert and attentive   Thought Content:  linear   Suicidal Thoughts:  No- at this time denies any suicidal ideations , and contracts for safety on the unit   Homicidal Thoughts:  No denies any violent ideations, but states he feels irritable   Memory:  recent and remote grossly intact   Judgement:  Fair  Insight:  Fair  Psychomotor Activity:  Decreased  Concentration:  Good  Recall:  Good  Fund of Knowledge:Good  Language: Good  Akathisia:  Negative  Handed:  Right  AIMS (if indicated):     Assets:  Desire for  Improvement Resilience  ADL's:  Fair   Cognition: WNL  Sleep:  Number of Hours: 6.5  Assessment - patient continues to present depressed, constricted , and vaguely irritable, dysphoric. No clear history of mania or hypomania, but has been consistent in his report that he feels irritable, and " wound up". He is tolerating Remeron well thus far, and did sleep better last night .  Denies suicidal ideations.   Treatment Plan Summary: Daily contact with patient to assess and evaluate symptoms and progress in treatment, Medication management, Plan inpatient admission  and medications as below  Encourage milieu participation to work on coping skills and symptom reduction  Continue Remeron 15 mgrs QHS for depression, insomnia  Start Abilify 2 mgrs QDAY for mood disorder- to address depression and irritability - will start low dose / titrate gradually as history of cardiovascular disease  Continue Synthroid for management of hypothyroidism  Treatment team working on disposition planning  Repeat EKG- history of CAD/A Fib /  repeat BMP .   Neita Garnet, MD 05/27/2015, 3:14 PM Addendum - have reviewed  EKG s, current medications with hospitalist: BMP and Mg++ level ordered , Hospitalist  Service will follow

## 2015-05-28 NOTE — BHH Group Notes (Signed)
[facility] LCSW Aftercare Discharge Planning Group Note  05/28/2015 8:45 AM  Pt did not attend, declined invitation.   [name], LCSWA 05/28/2015 10:26 AM

## 2015-05-28 NOTE — Tx Team (Signed)
Interdisciplinary Treatment Plan Update (Adult) Date: 05/28/2015   Date: 05/28/2015 3:27 PM  Progress in Treatment:  Attending groups: No  Participating in groups: No   Taking medication as prescribed: Yes  Tolerating medication: Yes  Family/Significant othe contact made: No, CSW attempting t make contact with brother Patient understands diagnosis: Yes AEB seeking help with depression and anxiety Discussing patient identified problems/goals with staff: Yes  Medical problems stabilized or resolved: Yes  Denies suicidal/homicidal ideation: No, Pt endorses passive SI Patient has not harmed self or Others: Yes   New problem(s) identified: None identified at this time.   Discharge Plan or Barriers: Pt will return home and follow-up with outpatient resources  Additional comments:  Patient and CSW reviewed pt's identified goals and treatment plan. Patient verbalized understanding and agreed to treatment plan. CSW reviewed [facility] "Discharge Process and Patient Involvement" Form. Pt verbalized understanding of information provided and signed form.   Reason for Continuation of Hospitalization:  Anxiety Depression Medication stabilization Suicidal ideation  Estimated length of stay: 2-3 days  Review of initial/current patient goals per problem list:   1.  Goal(s): Patient will participate in aftercare plan  Met:  Yes  Target date: 3-5 days from date of admission   As evidenced by: Patient will participate within aftercare plan AEB aftercare provider and housing plan at discharge being identified.  05/25/15: Pt will return home and follow-up with outpatient resources   2.  Goal (s): Patient will exhibit decreased depressive symptoms and suicidal ideations.  Met:  Progressing  Target date: 3-5 days from date of admission   As evidenced by: Patient will utilize self rating of depression at 3 or below and demonstrate decreased signs of depression or be deemed stable for discharge by  MD.  05/25/15: Pt rates depression at 4/10; endorses passive SI  05/28/15: Pt continues to rate depression at 4/10; endorses passive SI  3.  Goal(s): Patient will demonstrate decreased signs and symptoms of anxiety.  Met:  Progressing  Target date: 3-5 days from date of admission   As evidenced by: Patient will utilize self rating of anxiety at 3 or below and demonstrated decreased signs of anxiety, or be deemed stable for discharge by MD  05/25/15: Pt rates anxiety at 5/10  05/28/15: Pt rates anxiety at 5/10  Attendees:  Patient:    Family:    Physician: Dr. Parke Poisson, MD  05/28/2015 3:27 PM  Nursing: Lars Pinks, RN Case manager  05/28/2015 3:27 PM  Clinical Social Worker [name], [place] 05/28/2015 3:27 PM  Other: Tilden Fossa, [place] 05/28/2015 3:27 PM  Clinical: Marcella Dubs, RN; Vira Browns, RN  05/28/2015 3:27 PM  Other: , RN Charge Nurse 05/28/2015 3:27 PM  Other: Hilda Lias, [place], [place] Work [phone]

## 2015-05-28 NOTE — BHH Group Notes (Signed)
[facility] LCSW Group Therapy 05/28/2015 1:15pm  Type of Therapy: Group Therapy- Feelings Around Relapse and Recovery  Pt did not attend, declined invitation.   [name], [name] [phone] 05/28/2015 3:32 PM

## 2015-05-28 NOTE — Progress Notes (Signed)
Patient ID: Gary Kirk, male   DOB: Aug 17, 1965, 50 y.o.   MRN: [id number] D: Client found in bed, reports "been in bed the whole time I been here" depression "4", anxiety "6" Goal: "can't say one way or another" "the people here nice, they have tried" A: Writer provided emotional support, reviewed medications, administered as ordered. Staff will monitor q7min for safety.

## 2015-05-28 NOTE — Progress Notes (Signed)
Adult Psychoeducational Group Note  Date:  05/28/2015 Time:  9:18 PM  Group Topic/Focus:  Wrap-Up Group:   The focus of this group is to help patients review their daily goal of treatment and discuss progress on daily workbooks.  Participation Level:  Active  Participation Quality:  Appropriate  Affect:  Appropriate  Cognitive:  Alert  Insight: Appropriate  Engagement in Group:  Engaged  Modes of Intervention:  Discussion  Additional Comments:  Patient goal for today was to make it to all groups. Patient stated he only made it to 1, which was wrap up.  On a scale between 1-10, (1=worse, 10=best) patient rated his day a 4.  Alek Poncedeleon L Leilyn Frayre 05/28/2015, 9:18 PM

## 2015-05-28 NOTE — Progress Notes (Signed)
Recreation Therapy Notes  Date: 03.31.2017 Time: 9:30am Location: [address] Hall Group Room   Group Topic: Stress Management  Goal Area(s) Addresses:  Patient will actively participate in stress management techniques presented during session.   Behavioral Response: Did not attend.   Laureen Ochs Damyra Luscher, LRT/CTRS        Dayshon Roback L 05/28/2015 2:08 PM

## 2015-05-28 NOTE — Progress Notes (Signed)
Patient ID: Gary Kirk, male   DOB: 03-Mar-1965, 50 y.o.   MRN: [id number] [facility] MD Progress Note  05/28/2015 2:46 PM Gary Kirk  MRN:  [id number] Subjective:  Patient reports ongoing depression but today states he is starting to feel somewhat better . Denies any medication side effects.  Objective : I have discussed case with treatment team and have met with patient. Patient remains depressed, but today presenting with some improvement- states he feels better, less severely depressed, denies suicidal ideations, denies any self injurious ideations. He does remain isolative , but has been gradually more visible on unit and states " I am going to try to start going to more groups ". Of note, he reports symptoms of social phobia, which are part of why he has been avoidant of groups. At this time tolerating medications well , denies side effects.  No disruptive or agitated behaviors on unit .  Principal Problem: MDD , severe, no psychotic features  Diagnosis:   Patient Active Problem List   Diagnosis Date Noted  . Severe episode of recurrent major depressive disorder, without psychotic features ([place]) [F33.2]   . Severe recurrent major depression without psychotic features ([place]) [F33.2] 05/24/2015  . MDD (major depressive disorder) ([place]) [F32.9] 05/24/2015  . MDD (major depressive disorder), recurrent episode, severe ([place]) [F33.2] 05/24/2015   Total Time spent with patient: 20 minutes     Past Medical History:  Past Medical History  Diagnosis Date  . Hypertension   . CHF (congestive heart failure) ([place])   . A-fib ([place])   . COPD (chronic obstructive pulmonary disease) [facility])     Past Surgical History  Procedure Laterality Date  . Cardiac defibrillator placement     Family History: History reviewed. No pertinent family history.  Social History:  History  Alcohol Use No     History  Drug Use No    Social History   Social History  . Marital Status: Single    Spouse  Name: N/A  . Number of Children: N/A  . Years of Education: N/A   Social History Main Topics  . Smoking status: Former Smoker    Quit date: 04/29/2014  . Smokeless tobacco: None  . Alcohol Use: No  . Drug Use: No  . Sexual Activity: Not Asked   Other Topics Concern  . 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 acetaminophen (TYLENOL) tablet 1,000 mg  1,000 mg Oral Q6H PRN Laverle Hobby, PA-C      . albuterol (PROVENTIL HFA;VENTOLIN HFA) 108 (90 Base) MCG/ACT inhaler 2 puff  2 puff Inhalation Q8H PRN Laverle Hobby, PA-C      . alum & mag hydroxide-simeth (MAALOX/MYLANTA) 200-200-20 MG/5ML suspension 30 mL  30 mL Oral Q4H PRN Derrill Center, NP      . amiodarone (PACERONE) tablet 400 mg  400 mg Oral Daily Kerrie Buffalo, NP   400 mg at 05/28/15 0841  . apixaban (ELIQUIS) tablet 5 mg  5 mg Oral BID Laverle Hobby, PA-C   5 mg at 05/28/15 0841  .  ARIPiprazole (ABILIFY) tablet 2 mg  2 mg Oral Daily Jenne Campus, MD   2 mg at 05/28/15 0842  . carvedilol (COREG) tablet 25 mg  25 mg Oral BID Laverle Hobby, PA-C   25 mg at 05/28/15 6712  . furosemide (LASIX) tablet 40 mg  40 mg Oral Daily Kerrie Buffalo, NP   40 mg at 05/28/15 0841  . HYDROcodone-acetaminophen (NORCO/VICODIN) 5-325 MG per tablet 1 tablet  1 tablet Oral Q8H PRN Kerrie Buffalo, NP   1 tablet at 05/28/15 0843  . levothyroxine (SYNTHROID, LEVOTHROID) tablet 75 mcg  75 mcg Oral QAC breakfast Kerrie Buffalo, NP   75 mcg at 05/28/15 [phone]  . magnesium hydroxide (MILK  OF MAGNESIA) suspension 30 mL  30 mL Oral Daily PRN Derrill Center, NP      . magnesium oxide (MAG-OX) tablet 400 mg  400 mg Oral BID Phillips Grout, MD   400 mg at 05/28/15 0841  . mirtazapine (REMERON) tablet 15 mg  15 mg Oral QHS Jenne Campus, MD   15 mg at 05/27/15 2131  . nitroGLYCERIN (NITROSTAT) SL tablet 0.4 mg  0.4 mg Sublingual Q5 min PRN Laverle Hobby, PA-C      . potassium chloride SA (K-DUR,KLOR-CON) CR tablet 20 mEq  20 mEq Oral Daily Laverle Hobby, PA-C   20 mEq at 05/28/15 0841  . tiotropium (SPIRIVA) inhalation capsule 18 mcg  18 mcg Inhalation Daily PRN Laverle Hobby, PA-C      . traZODone (DESYREL) tablet 50 mg  50 mg Oral QHS PRN Jenne Campus, MD   50 mg at 05/27/15 2132    Lab Results:  Results for orders placed or performed during the hospital encounter of 05/24/15 (from the past 48 hour(s))  Basic metabolic panel     Status: Abnormal   Collection Time: 05/27/15  6:43 PM  Result Value Ref Range   Sodium 139 135 - 145 mmol/L   Potassium 3.8 3.5 - 5.1 mmol/L   Chloride 103 101 - 111 mmol/L   CO2 24 22 - 32 mmol/L   Glucose, Bld 118 (H) 65 - 99 mg/dL   BUN 20 6 - 20 mg/dL   Creatinine, Ser 1.50 (H) 0.61 - 1.24 mg/dL   Calcium 8.7 (L) 8.9 - 10.3 mg/dL   GFR calc non Af Amer 53 (L) >60 mL/min   GFR calc Af Amer >60 >60 mL/min    Comment: (NOTE) The eGFR has been calculated using the CKD EPI equation. This calculation has not been validated in all clinical situations. eGFR's persistently <60 mL/min signify possible Chronic Kidney Disease.    Anion gap 12 5 - 15    Comment: Performed at [facility]  Magnesium     Status: None   Collection Time: 05/27/15  6:43 PM  Result Value Ref Range   Magnesium 1.7 1.7 - 2.4 mg/dL    Comment: Performed at [facility]    Blood Alcohol level:  Lab Results  Component Value Date   [facility] <5 05/23/2015    Physical Findings: AIMS: Facial and Oral Movements Muscles of Facial  Expression: None, normal Lips and Perioral Area: None, normal Jaw: None, normal Tongue: None, normal,Extremity Movements Upper (arms, wrists, hands, fingers): None, normal Lower (legs, knees, ankles, toes): None, normal, Trunk Movements Neck, shoulders, hips: None, normal, Overall Severity Severity of abnormal movements (highest score from questions above): None, normal Incapacitation due to abnormal movements: None, normal Patient's  awareness of abnormal movements (rate only patient's report): No Awareness, Dental Status Current problems with teeth and/or dentures?: No Does patient usually wear dentures?: No  CIWA:    COWS:     Musculoskeletal: Strength & Muscle Tone: within normal limits Gait & Station: normal Patient leans: N/A  Psychiatric Specialty Exam: ROS no headache, no chest pain, no shortness of breath   Blood pressure 136/86, pulse 87, temperature 97.8 F (36.6 C), temperature source Oral, resp. rate 18, height 6' (1.829 m), weight 330 lb (149.687 kg), SpO2 97 %.Body mass index is 44.75 kg/(m^2).  General Appearance: Fairly Groomed  Engineer, water::  Good  Speech:  Normal Rate  Volume:  Normal  Mood:  Less severely depressed, starting to improve gradually   Affect:  Remains constricted   Thought Process:  Linear  Orientation:  Other:  fully alert and attentive   Thought Content:  linear   Suicidal Thoughts:  No- at this time denies any suicidal ideations , and contracts for safety on the unit   Homicidal Thoughts:  No denies any violent ideations, but states he feels irritable   Memory:  recent and remote grossly intact   Judgement:  Improving   Insight:  Improving   Psychomotor Activity:  Decreased  Concentration:  Good  Recall:  Good  Fund of Knowledge:Good  Language: Good  Akathisia:  Negative  Handed:  Right  AIMS (if indicated):     Assets:  Desire for Improvement Resilience  ADL's:  Fair   Cognition: WNL  Sleep:  Number of Hours: 6.75  Assessment -  patient presenting with partially improved mood and affect , although remains depressed . Denies suicidal ideations . No psychotic symptoms . Remains constricted in affect but acknowledges he is feeling better and less irritable.  Patient encouraged to spend more time out of bed and states he plans to increase group participation . Currently tolerating Abilify and Remeron well .  Of note, I have reviewed EKG findings ( increased QTc ) with Hospitalist Service  as follows :  Mg++ serum level normal. Felt to be function of biventricular pacemaker rhythm .   Treatment Plan Summary: Daily contact with patient to assess and evaluate symptoms and progress in treatment, Medication management, Plan inpatient admission  and medications as below  Encourage milieu participation to work on coping skills and symptom reduction  Continue Remeron 15 mgrs QHS for depression, insomnia  Continue  Abilify 2 mgrs QDAY for mood disorder, depression  Continue Synthroid for management of hypothyroidism  Treatment team working on disposition planning    Neita Garnet, MD 05/28/2015, 2:46 PM

## 2015-05-28 NOTE — Progress Notes (Signed)
Patient ID: Gary Kirk, male   DOB: May 09, 1965, 50 y.o.   MRN: [id number] D: Client reports depression and anxiety "4" of 10, "made it to group today, but I'm just waiting for my medicine so I can go to bed. Client reports no harmful thoughts "not right now" Client reports a little disappointment because a visitor did not show up. Client did not disclose who the visitor was. A: Writer encouraged client to continue mediations to receive full benefits from them, also noted how much support groups can be. Staff will monitor q16min for safety. R: Client is safe on the unit.

## 2015-05-28 NOTE — Progress Notes (Signed)
Gary Kirk is seen OOB UAL on the 400 hall this morning. He tolerates this well. He is cooperative, takes his scheduled meds as ordered. He completes his daily assessment and writes on it he has experienced SI today but he easily says he will ' not hurt myself while I'm here". He is pleasant and makes good eye contact. He rates his depression, hopelessness and anxiety " 4/4/5", respectively. R Safety in place.

## 2015-05-29 DIAGNOSIS — R7989 Other specified abnormal findings of blood chemistry: Secondary | ICD-10-CM | POA: Insufficient documentation

## 2015-05-29 DIAGNOSIS — R9431 Abnormal electrocardiogram [ECG] [EKG]: Secondary | ICD-10-CM | POA: Insufficient documentation

## 2015-05-29 LAB — GLUCOSE, CAPILLARY: GLUCOSE-CAPILLARY: 100 mg/dL — AB (ref 65–99)

## 2015-05-29 MED ORDER — ONDANSETRON 4 MG PO TBDP
4.0000 mg | ORAL_TABLET | Freq: Once | ORAL | Status: AC
  Administered 2015-05-29: 4 mg via ORAL
  Filled 2015-05-29 (×2): qty 1

## 2015-05-29 NOTE — Progress Notes (Signed)
Patient has been up in the dayroom watching tv and shortly came to medication window c/o feeling nauseated, sob and stomach hurting. NP on call notified after vitals were taken and order given for an EKG. She reviewed the results and got an order for zofran, once.  Patient requested his sleep medication also before going to bed.Safety maintained on unit with 15 min checks

## 2015-05-29 NOTE — BHH Group Notes (Signed)
[facility] LCSW Group Therapy  05/29/2015 / 10 AM  Type of Therapy:  Group Therapy  Participation Level:  Did Not Attend although encouraged to do so  Sheilah Pigeon, LCSW

## 2015-05-29 NOTE — Consult Note (Signed)
[facility] Medical Consultation  Gary Kirk [id number] DOB: [birth year] DOA: 05/24/2015 PCP:  Noted in care everywhere.   Cardiologist: Dr Margrett Rud in [place], [place] Requesting physician: Dr Parke Poisson Date of consultation: 05-29-15 Reason for consultation: Address EKG changes, serum creatinine levels.   Impression/Recommendations Active Problems:   MDD (major depressive disorder) (HCC)   MDD (major depressive disorder), recurrent episode, severe (HCC)   Severe episode of recurrent major depressive disorder, without psychotic features ([place])    1. EKG changes: paced rhythm from his ICD/PPM placement. No changes since last tracing.  Care everywhere notes reviewed: patient has an appointment with his cardiologist Dr Margrett Rud in [place], [place] on 06-10-15. Hopefully, he can make it to this appointment, patient reports that he has not had his PPM/ICD device interrogated in a long time.  2. Borderline elevated serum creatinine: patient with serum creatinine 3-26 1.33, on 3-30 level 1.33. No problems urinating, no edema, etc. Ordered repeat BMP on 4-2. Likely due to patient being on Lasix. Continue to trend serum creatinine levels, if ongoing incline, then can consider work up with U/A, renal ultra sound etc.      Please contact [place] provider with additional questions. [email or web address], password [email or web address].       Thank you for this consultation.  Chief Complaint: admitted at [facility] since 3-27 -17 for major depressive disorder.   HPI:   50 yo gentleman with chronic systolic CHF, s/p PPM ICD placement, COPD, HTN, A fib, hypothyroidism.  Patient is admitted to [facility] since 3-27 for major depressive disorder.  Hospitalist consult requested for addressing EKG changes and also serum creatinine levels.   Patient is awake alert, sitting in day room. Patient walked to his room. He is in no distress. He denies any chest pain, denies any shortness of breath. Does not have swelling on his hands and feet. He has not had his  device interrogated in a long time. He has an appointment with his cardiologist coming up.   Complains of low back pain, denies dysuria. He used to lift heavy weights in the past.   Last bowel movement was 4-1. No acute GI related complaints either.   Meds history noted: patient on lasix 40 mg po once daily, patient on amiodarone, eliquis, coreg, NTG etc. Continue current med regimen.   Review of Systems:  - for chest pain - for dyspnea + for low back chronic discomfort - for abdominal pain - for extremity edema - for dizziness or weakness.   Past Medical History  Diagnosis Date  . Hypertension   . CHF (congestive heart failure) ([place])   . A-fib ([place])   . COPD (chronic obstructive pulmonary disease) [facility])    Past Surgical History  Procedure Laterality Date  . Cardiac defibrillator placement     Social History:  30 pack year smoking history, reports that he quit smoking in October 2015. He does not have any smokeless tobacco history on file. He reports that he does not drink alcohol or use illicit drugs.  Allergies  Allergen Reactions  . Tramadol Shortness Of Breath  . Motrin [Ibuprofen] Other (See Comments)    Spit up blood   History reviewed.   Dad with cancer of unknown etiology Mom with history of CHF  Prior to Admission medications   Medication Sig Start Date End Date Taking? Authorizing Provider  amiodarone (PACERONE) 400 MG tablet Take 400 mg by mouth daily. 01/06/15  Yes Historical Provider, MD  furosemide (LASIX) 40 MG tablet Take 40 mg by mouth  daily. 03/12/15  Yes Historical Provider, MD  HYDROcodone-acetaminophen (NORCO/VICODIN) 5-325 MG tablet Take 1 tablet by mouth every 8 (eight) hours as needed for moderate pain. Please note:  This NP called [facility] on [address] [place] to verifiy dose and frequency.   Yes Historical Provider, MD  levothyroxine (SYNTHROID, LEVOTHROID) 75 MCG tablet Take 75 mcg by mouth daily before breakfast.   Yes Historical Provider, MD   acetaminophen (TYLENOL) 500 MG tablet Take 1,000 mg by mouth every 6 (six) hours as needed for mild pain, moderate pain, fever or headache.    Historical Provider, MD  albuterol (PROAIR HFA) 108 (90 Base) MCG/ACT inhaler Inhale 2 puffs into the lungs every 8 (eight) hours.     Historical Provider, MD  albuterol (PROVENTIL) (2.5 MG/3ML) 0.083% nebulizer solution 2.5 mg.    Historical Provider, MD  aspirin EC 81 MG tablet Take 81 mg by mouth daily. 04/12/15 04/11/16  Historical Provider, MD  carvedilol (COREG) 25 MG tablet Take 25 mg by mouth 2 (two) times daily. 09/15/13   Historical Provider, MD  ELIQUIS 5 MG TABS tablet Take 5 mg by mouth 2 (two) times daily. 04/26/15   Historical Provider, MD  nitroGLYCERIN (NITROSTAT) 0.4 MG SL tablet Place 0.4 mg under the tongue every 5 (five) minutes as needed for chest pain.  04/12/15   Historical Provider, MD  potassium chloride SA (K-DUR,KLOR-CON) 20 MEQ tablet Take 20 mEq by mouth daily. 04/12/15   Historical Provider, MD  tiotropium (SPIRIVA HANDIHALER) 18 MCG inhalation capsule Place 18 mcg into inhaler and inhale daily as needed (for shortness of breath).  09/04/13   Historical Provider, MD   Physical Exam: Blood pressure 130/86, pulse 88, temperature 97.7 F (36.5 C), temperature source Oral, resp. rate 18, height 6' (1.829 m), weight 149.687 kg (330 lb), SpO2 97 %. Filed Vitals:   05/29/15 0611 05/29/15 0612  BP: 148/92 130/86  Pulse: 89 88  Temp: 97.7 F (36.5 C)   Resp: 18   NAD Resting comfortably S1 S2 Clear Abdomen soft non distended No edema Non focal Flat affect   Labs on Admission:  Basic Metabolic Panel:  Recent Labs Lab 05/23/15 2133 05/27/15 1843  NA 136 139  K 4.1 3.8  CL 104 103  CO2 22 24  GLUCOSE 90 118*  BUN 16 20  CREATININE 1.33* 1.50*  CALCIUM 9.2 8.7*  MG  --  1.7   Liver Function Tests:  Recent Labs Lab 05/23/15 2133  AST 36  ALT 32  ALKPHOS 77  BILITOT 0.7  PROT 8.0  ALBUMIN 3.9   No results for  input(s): LIPASE, AMYLASE in the last 168 hours. No results for input(s): AMMONIA in the last 168 hours. CBC:  Recent Labs Lab 05/23/15 2133  WBC 10.0  HGB 15.4  HCT 45.3  MCV 94.0  PLT 197   Cardiac Enzymes: No results for input(s): CKTOTAL, CKMB, CKMBINDEX, TROPONINI in the last 168 hours. BNP: Invalid input(s): POCBNP CBG: No results for input(s): GLUCAP in the last 168 hours.  Radiological Exams on Admission: No results found.  EKG: Independently reviewed. Yes, agree with read: paced rhythm.   Time spent: 37 min   Loistine Chance [facility] Pager [phone]   If 7PM-7AM, please contact night-coverage [email or web address] Password [email or web address] 05/29/2015, 12:21 PM

## 2015-05-29 NOTE — Progress Notes (Signed)
[name] remains status quo: still chronically flat, depressed and blunted. Again, he spent most of his day in his bed...aslee-p...isolating to his room. He does not initiate much contact with other patients and / or the staff. [name] is cooperative and pleasant when he presents to the med window to take his am medications. He does complete his daily assessment and on it he wrote he denied SI and he rated his depression, hopelessness and anxiety " 6/6/2", respectively. R He did a good job when he attend his [employer] group today and  Demonstrates ( by his conversation ) that he is trying to develop healtheir coping skills. Pos reinforcement offered by this Probation officer.

## 2015-05-29 NOTE — Progress Notes (Signed)
[facility] MD Progress Note  05/29/2015 11:16 AM Gary Kirk  MRN:  [id number]   Subjective: Patient reports " I feel fine, but I the doctor didn't change my medication yet."  Objective :Gary Kirk is awake, alert and oriented X4 , found resting in bedroom.  Denies suicidal or homicidal ideation. Denies auditory or visual hallucination and does not appear to be responding to internal stimuli.  Patient reports she is medication compliant without mediation side effects. Reports that " I told the MD yesterday that my insurance wouldn't cover the Remeron". States his depression 5/10. Patient reports " I am don't feel like going to group" Reports good appetite other wise and resting well. Support, encouragement and reassurance was provided.   Principal Problem: MDD , severe, no psychotic features  Diagnosis:   Patient Active Problem List   Diagnosis Date Noted  . Severe episode of recurrent major depressive disorder, without psychotic features ([place]) [F33.2]   . Severe recurrent major depression without psychotic features ([place]) [F33.2] 05/24/2015  . MDD (major depressive disorder) ([place]) [F32.9] 05/24/2015  . MDD (major depressive disorder), recurrent episode, severe ([place]) [F33.2] 05/24/2015   Total Time spent with patient: 20 minutes     Past Medical History:  Past Medical History  Diagnosis Date  . Hypertension   . CHF (congestive heart failure) ([place])   . A-fib ([place])   . COPD (chronic obstructive pulmonary disease) [facility])     Past Surgical History  Procedure Laterality Date  . Cardiac defibrillator placement     Family History: History reviewed. No pertinent family history.  Social History:  History  Alcohol Use No     History  Drug Use No    Social History   Social History  . Marital Status: Single    Spouse Name: N/A  . Number of Children: N/A  . Years of Education: N/A   Social History Main Topics  . Smoking status: Former Smoker    Quit date: 04/29/2014  .  Smokeless tobacco: None  . Alcohol Use: No  . Drug Use: No  . Sexual Activity: Not Asked   Other Topics Concern  . 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 acetaminophen (TYLENOL) tablet 1,000 mg  1,000 mg Oral Q6H PRN Laverle Hobby, PA-C      . albuterol (PROVENTIL HFA;VENTOLIN HFA) 108 (90 Base) MCG/ACT inhaler 2 puff  2 puff Inhalation Q8H PRN Laverle Hobby, PA-C      . alum & mag hydroxide-simeth (MAALOX/MYLANTA) 200-200-20 MG/5ML suspension 30 mL  30 mL Oral Q4H PRN Derrill Center, NP      . amiodarone (PACERONE) tablet 400 mg  400 mg Oral Daily Kerrie Buffalo, NP   400 mg at 05/29/15 0829  . apixaban (ELIQUIS) tablet 5 mg  5 mg Oral BID Laverle Hobby, PA-C   5 mg at 05/29/15 0737  . ARIPiprazole (ABILIFY) tablet 2 mg  2 mg Oral Daily Jenne Campus, MD   2 mg at 05/29/15 0830  . carvedilol (COREG) tablet 25 mg  25 mg  Oral BID Laverle Hobby, PA-C   25 mg at 05/29/15 5643  . furosemide (LASIX) tablet 40 mg  40 mg Oral Daily Kerrie Buffalo, NP   40 mg at 05/29/15 0829  . HYDROcodone-acetaminophen (NORCO/VICODIN) 5-325 MG per tablet 1 tablet  1 tablet Oral Q8H PRN Kerrie Buffalo, NP   1 tablet at 05/29/15 0830  . levothyroxine (SYNTHROID, LEVOTHROID) tablet 75 mcg  75 mcg Oral QAC breakfast Kerrie Buffalo, NP   75 mcg at 05/29/15 [phone]  . magnesium hydroxide (MILK OF MAGNESIA) suspension 30 mL  30 mL Oral Daily PRN Derrill Center, NP      . magnesium oxide (MAG-OX) tablet 400 mg  400 mg Oral BID Phillips Grout, MD   400 mg  at 05/29/15 1884  . mirtazapine (REMERON) tablet 15 mg  15 mg Oral QHS Jenne Campus, MD   15 mg at 05/28/15 2130  . nitroGLYCERIN (NITROSTAT) SL tablet 0.4 mg  0.4 mg Sublingual Q5 min PRN Laverle Hobby, PA-C      . potassium chloride SA (K-DUR,KLOR-CON) CR tablet 20 mEq  20 mEq Oral Daily Laverle Hobby, PA-C   20 mEq at 05/29/15 0829  . tiotropium (SPIRIVA) inhalation capsule 18 mcg  18 mcg Inhalation Daily PRN Laverle Hobby, PA-C      . traZODone (DESYREL) tablet 50 mg  50 mg Oral QHS PRN Jenne Campus, MD   50 mg at 05/28/15 2132    Lab Results:  Results for orders placed or performed during the hospital encounter of 05/24/15 (from the past 48 hour(s))  Basic metabolic panel     Status: Abnormal   Collection Time: 05/27/15  6:43 PM  Result Value Ref Range   Sodium 139 135 - 145 mmol/L   Potassium 3.8 3.5 - 5.1 mmol/L   Chloride 103 101 - 111 mmol/L   CO2 24 22 - 32 mmol/L   Glucose, Bld 118 (H) 65 - 99 mg/dL   BUN 20 6 - 20 mg/dL   Creatinine, Ser 1.50 (H) 0.61 - 1.24 mg/dL   Calcium 8.7 (L) 8.9 - 10.3 mg/dL   GFR calc non Af Amer 53 (L) >60 mL/min   GFR calc Af Amer >60 >60 mL/min    Comment: (NOTE) The eGFR has been calculated using the CKD EPI equation. This calculation has not been validated in all clinical situations. eGFR's persistently <60 mL/min signify possible Chronic Kidney Disease.    Anion gap 12 5 - 15    Comment: Performed at [facility]  Magnesium     Status: None   Collection Time: 05/27/15  6:43 PM  Result Value Ref Range   Magnesium 1.7 1.7 - 2.4 mg/dL    Comment: Performed at [facility]    Blood Alcohol level:  Lab Results  Component Value Date   [facility] <5 05/23/2015    Physical Findings: AIMS: Facial and Oral Movements Muscles of Facial Expression: None, normal Lips and Perioral Area: None, normal Jaw: None, normal Tongue: None, normal,Extremity Movements Upper (arms, wrists, hands, fingers):  None, normal Lower (legs, knees, ankles, toes): None, normal, Trunk Movements Neck, shoulders, hips: None, normal, Overall Severity Severity of abnormal movements (highest score from questions above): None, normal Incapacitation due to abnormal movements: None, normal Patient's awareness of abnormal movements (rate only patient's report): No Awareness, Dental Status Current problems with teeth and/or dentures?: No Does patient usually wear dentures?: No  CIWA:    COWS:  Musculoskeletal: Strength & Muscle Tone: within normal limits Gait & Station: normal Patient leans: N/A  Psychiatric Specialty Exam: Review of Systems  Psychiatric/Behavioral: Positive for depression. Negative for suicidal ideas. The patient is not nervous/anxious. Insomnia: stable.   All other systems reviewed and are negative.  no headache, no chest pain, no shortness of breath   Blood pressure 130/86, pulse 88, temperature 97.7 F (36.5 C), temperature source Oral, resp. rate 18, height 6' (1.829 m), weight 149.687 kg (330 lb), SpO2 97 %.Body mass index is 44.75 kg/(m^2).  General Appearance: Fairly Groomed  Engineer, water::  Good  Speech:  Normal Rate  Volume:  Normal  Mood:  Less severely depressed, starting to improve gradually   Affect:  Remains constricted   Thought Process:  Linear  Orientation:  Other:  fully alert and attentive   Thought Content:  linear   Suicidal Thoughts:  No- at this time denies any suicidal ideations , and contracts for safety on the unit   Homicidal Thoughts:  No denies any violent ideations,  Memory:  recent and remote grossly intact   Judgement:  Improving   Insight:  Improving   Psychomotor Activity:  Decreased  Concentration:  Good  Recall:  Good  Fund of Knowledge:Good  Language: Good  Akathisia:  Negative  Handed:  Right  AIMS (if indicated):     Assets:  Desire for Improvement Resilience  ADL's:  Fair   Cognition: WNL  Sleep:  Number of Hours: 6.75    I agree  with current treatment plan on 05/29/2015, Patient seen face-to-face for psychiatric evaluation follow-up, chart reviewed. Reviewed the information documented and agree with the treatment plan.  Of note, I have reviewed EKG findings ( increased QTc ) with Hospitalist Service  as follows :  Mg++ serum level normal. Felt to be function of biventricular pacemaker rhythm .   Treatment Plan Summary: Daily contact with patient to assess and evaluate symptoms and progress in treatment, Medication management, Plan inpatient admission  and medications as below    Encourage milieu participation to work on coping skills and symptom reduction  Discontinue  Remeron 15 mgrs QHS for depression, insomnia Start Trazodone 50 mg PO x1 repeat for/ insomnia   Continue  Abilify 2 mgrs QDAY for mood disorder, depression  Continue Synthroid for management of hypothyroidism  Treatment team working on disposition planning    Derrill Center, NP 05/29/2015, 11:16 AM

## 2015-05-29 NOTE — [facility] Group Notes (Signed)
[facility] Group Notes:  (Nursing/MHT/Case Management/Adjunct)  Date:  05/29/2015  Time:  1315  Type of Therapy:  Nurse Education  Participation Level:  Did Not Attend  Participation Quality:    Affect:    Cognitive:    Insight:    Engagement in Group:    Modes of Intervention:    Summary of Progress/Problems:  Lauralyn Primes 05/29/2015, 3:01 PM

## 2015-05-30 DIAGNOSIS — R748 Abnormal levels of other serum enzymes: Secondary | ICD-10-CM

## 2015-05-30 LAB — BASIC METABOLIC PANEL
Anion gap: 8 (ref 5–15)
BUN: 15 mg/dL (ref 6–20)
CALCIUM: 9.1 mg/dL (ref 8.9–10.3)
CO2: 28 mmol/L (ref 22–32)
CREATININE: 1.29 mg/dL — AB (ref 0.61–1.24)
Chloride: 104 mmol/L (ref 101–111)
GFR calc non Af Amer: >60 mL/min (ref >60)
Glucose, Bld: 95 mg/dL (ref 65–99)
Potassium: 3.8 mmol/L (ref 3.5–5.1)
SODIUM: 140 mmol/L (ref 135–145)

## 2015-05-30 MED ORDER — ARIPIPRAZOLE 5 MG PO TABS
5.0000 mg | ORAL_TABLET | Freq: Every day | ORAL | Status: DC
  Administered 2015-05-31 – 2015-06-02 (×3): 5 mg via ORAL
  Filled 2015-05-30 (×5): qty 1

## 2015-05-30 MED ORDER — TRAZODONE HCL 100 MG PO TABS
100.0000 mg | ORAL_TABLET | Freq: Every evening | ORAL | Status: DC | PRN
  Administered 2015-05-30 – 2015-06-01 (×2): 100 mg via ORAL
  Filled 2015-05-30 (×3): qty 1

## 2015-05-30 NOTE — Progress Notes (Signed)
Patient Demographics  Ritvik Popoca, is a 50 y.o. male, DOB - May 01, 1965, [id number]  Admit date - 05/24/2015   Admitting Physician Jenne Campus, MD  Outpatient Primary MD for the patient is No primary care provider on file.  LOS - 6   No chief complaint on file.      Admission HPI/Brief narrative: 50 yo gentleman with chronic systolic CHF, s/p PPM ICD placement, COPD, HTN, A fib, hypothyroidism.  Patient is admitted to [facility] since 3-27 for major depressive disorder. [facility] were called to evaluate for EKG changes and elevation in serum creatinine level.   Subjective:   Shon Millet today has, No headache, No chest pain, No abdominal pain - No Nausea, No new weakness tingling or numbness, No Cough - SOB.   Assessment & Plan    Active Problems:   MDD (major depressive disorder) (HCC)   MDD (major depressive disorder), recurrent episode, severe (HCC)   Severe episode of recurrent major depressive disorder, without psychotic features ([place])   Nonspecific abnormal electrocardiogram (ECG) (EKG)   Creatinine elevation  CKD stage III - Patient baseline creatinine 1.45-1.59 this is most recent at [facility] record in August 2016, patient with creatinine of 1.5, today is 1.29, this appears to be at baseline, continue to monitor, no further intervention at this point.  Persistent atrial fibrillation - Continue Eliquis for anticoagulation, heart rate controlled, continue with Coreg and amiodarone  Chronic systolic heart failure/nonischemic cardiomyopathy - Most recent EF on nuclear stress test in August 2016 is 28%, most recent echo in care over to wear with echo 11/2013 with EF 15%. - Management per primary patient cardiologist in [facility], a shunt was scheduled for follow-up on 06/10/2015, she reports he recently moved permanently to [facility] and request start following with local cardiology group,  try to arrange an a.m.Marland Kitchen - Patient to be euvolemic, continue with Lasix at current dose.  COPD - No active wheezing  Hypertension - blood pressure acceptable, continue with current regimen  MDD - Management per primary psychiatric team.  Medications  Scheduled Meds: . amiodarone  400 mg Oral Daily  . apixaban  5 mg Oral BID  . [START ON 05/31/2015] ARIPiprazole  5 mg Oral Daily  . carvedilol  25 mg Oral BID  . furosemide  40 mg Oral Daily  . levothyroxine  75 mcg Oral QAC breakfast  . potassium chloride SA  20 mEq Oral Daily   Continuous Infusions:  PRN Meds:.acetaminophen, albuterol, alum & mag hydroxide-simeth, HYDROcodone-acetaminophen, magnesium hydroxide, nitroGLYCERIN, tiotropium, traZODone   Lab Results  Component Value Date   PLT 197 05/23/2015    Antibiotics    Anti-infectives    None          Objective:   Filed Vitals:   05/29/15 2014 05/29/15 2015 05/30/15 0700 05/30/15 0701  BP: 119/79 120/80 136/88 121/70  Pulse: 90 89 89 72  Temp: 97.7 F (36.5 C)  97.7 F (36.5 C)   TempSrc: Oral     Resp: 22  16   Height:      Weight:      SpO2:        Wt Readings from Last 3 Encounters:  05/24/15 149.687 kg (330 lb)  04/29/15 154.223 kg (340 lb)  No intake or output data in the 24 hours ending 05/30/15 1806   Physical Exam  Awake Alert, Oriented X 3, No new F.N deficits, Normal affect [place].AT,PERRAL Supple Neck,No JVD, No cervical lymphadenopathy appriciated.  Symmetrical Chest wall movement, Good air movement bilaterally, CTAB RRR,No Gallops,Rubs or new Murmurs, No Parasternal Heave +ve B.Sounds, Abd Soft, No tenderness, No organomegaly appriciated, No rebound - guarding or rigidity. No Cyanosis, Clubbing or edema, No new Rash or bruise    Data Review   Micro Results No results found for this or any previous visit (from the past 240 hour(s)).  Radiology Reports No results found.   CBC  Recent Labs Lab 05/23/15 2133  WBC 10.0    HGB 15.4  HCT 45.3  PLT 197  MCV 94.0  MCH 32.0  MCHC 34.0  RDW 13.4    Chemistries   Recent Labs Lab 05/23/15 2133 05/27/15 1843 05/30/15 0630  NA 136 139 140  K 4.1 3.8 3.8  CL 104 103 104  CO2 22 24 28   GLUCOSE 90 118* 95  BUN 16 20 15   CREATININE 1.33* 1.50* 1.29*  CALCIUM 9.2 8.7* 9.1  MG  --  1.7  --   AST 36  --   --   ALT 32  --   --   ALKPHOS 77  --   --   BILITOT 0.7  --   --    ------------------------------------------------------------------------------------------------------------------ estimated creatinine clearance is 103.1 mL/min (by C-G formula based on Cr of 1.29). ------------------------------------------------------------------------------------------------------------------ No results for input(s): HGBA1C in the last 72 hours. ------------------------------------------------------------------------------------------------------------------ No results for input(s): CHOL, HDL, LDLCALC, TRIG, CHOLHDL, LDLDIRECT in the last 72 hours. ------------------------------------------------------------------------------------------------------------------ No results for input(s): TSH, T4TOTAL, T3FREE, THYROIDAB in the last 72 hours.  Invalid input(s): FREET3 ------------------------------------------------------------------------------------------------------------------ No results for input(s): VITAMINB12, FOLATE, FERRITIN, TIBC, IRON, RETICCTPCT in the last 72 hours.  Coagulation profile No results for input(s): INR, PROTIME in the last 168 hours.  No results for input(s): DDIMER in the last 72 hours.  Cardiac Enzymes No results for input(s): CKMB, TROPONINI, MYOGLOBIN in the last 168 hours.  Invalid input(s): CK ------------------------------------------------------------------------------------------------------------------ Invalid input(s): POCBNP     Time Spent in minutes   30 minutes   ELGERGAWY, DAWOOD M.D on 05/30/2015 at 6:06  PM  Between 7am to 7pm - Pager - [phone]  After 7pm go to [email or web address] - password [facility]  [facility]   Office  [phone]

## 2015-05-30 NOTE — [facility] Group Notes (Signed)
[place] LCSW Group Therapy  05/30/2015 10:00 AM   Type of Therapy:  Group Therapy  Participation Level:  Did Not Attend  Theressa Millard, LCSW 05/30/2015 11:52 AM

## 2015-05-30 NOTE — Plan of Care (Signed)
Problem: Ineffective individual coping Goal: STG: Patient will remain free from self harm Outcome: Progressing Patient has remained free from harm and safety maintained on unit with 15 min checks.

## 2015-05-30 NOTE — Progress Notes (Signed)
Adult Psychoeducational Group Note  Date:  05/30/2015 Time:  8:20 PM  Group Topic/Focus:  Wrap-Up Group:   The focus of this group is to help patients review their daily goal of treatment and discuss progress on daily workbooks.  Participation Level:  Did Not Attend  Pt was asleep during wrap-up group.    Lincoln Brigham 05/30/2015, 9:11 PM

## 2015-05-30 NOTE — Progress Notes (Signed)
Gary Kirk is status quo: he remains quiet.isolative.depressed and flat. He stays in his bed mostly today,a s he has done the past 3 days. HE takes his scheduled medicines as planned and he answers the questions on his daily assessment ( when this nurse asks them to him) and he denied experiencing SI today as well [name] attended his [employer] group this afternoon and is actively trying to process his unhealthy behaviors. R Safety is in place. In med consult MD in to see him and he says this physician told him he will help Gary Kirk get f/u connection with cardiologist in [place] ( he says he recently moved here from [place]).

## 2015-05-30 NOTE — [facility] Group Notes (Signed)
[place] Group Notes:  (Nursing/MHT/Case Management/Adjunct)  Date:  05/30/2015  Time:  1315  Type of Therapy:  Nurse Education  /  Healthy Support Systems :   The group focuses on teaching patients how to devlop and use healthy su[pport systems.  Participation Level:  Active  Participation Quality:  Appropriate  Affect:  Blunted  Cognitive:  Alert  Insight:  Appropriate  Engagement in Group:  Engaged  Modes of Intervention:  Education  Summary of Progress/Problems:  Gary Kirk 05/30/2015, 4:31 PM

## 2015-05-30 NOTE — Plan of Care (Signed)
Problem: Diagnosis: Increased Risk For Suicide Attempt Goal: STG-Patient Will Attend All Groups On The Unit Outcome: Progressing Patient attended evening wrap up group tonight.

## 2015-05-30 NOTE — Progress Notes (Signed)
[facility] MD Progress Note  05/30/2015 9:07 AM Gary Kirk  MRN:  [id number]   Subjective: Patient reports " You stopped my medication but didn't start any thing back, I am still depressed"  Objective :Gary Kirk is awake, alert and oriented X4 , found resting in bedroom.  Denies suicidal or homicidal ideation. Denies auditory or visual hallucination and does not appear to be responding to internal stimuli.  Patient reports she is medication compliant without mediation side effects. Reports that " I told the MD yesterday that my insurance wouldn't cover the Remeron".  States his depression 5/10. Patient reports " I am don't feel like going to group I cant deal with crowds" Patient reports he will try to attended group sessions. Reports good appetite other wise and resting well. Support, encouragement and reassurance was provided.   Principal Problem: MDD , severe, no psychotic features  Diagnosis:   Patient Active Problem List   Diagnosis Date Noted  . Nonspecific abnormal electrocardiogram (ECG) (EKG) [R94.31]   . Creatinine elevation [R74.8]   . Severe episode of recurrent major depressive disorder, without psychotic features ([place]) [F33.2]   . Severe recurrent major depression without psychotic features ([place]) [F33.2] 05/24/2015  . MDD (major depressive disorder) ([place]) [F32.9] 05/24/2015  . MDD (major depressive disorder), recurrent episode, severe ([place]) [F33.2] 05/24/2015   Total Time spent with patient: 20 minutes     Past Medical History:  Past Medical History  Diagnosis Date  . Hypertension   . CHF (congestive heart failure) ([place])   . A-fib ([place])   . COPD (chronic obstructive pulmonary disease) [facility])     Past Surgical History  Procedure Laterality Date  . Cardiac defibrillator placement     Family History: History reviewed. No pertinent family history.  Social History:  History  Alcohol Use No     History  Drug Use No    Social History   Social History  .  Marital Status: Single    Spouse Name: N/A  . Number of Children: N/A  . Years of Education: N/A   Social History Main Topics  . Smoking status: Former Smoker    Quit date: 04/29/2014  . Smokeless tobacco: None  . Alcohol Use: No  . Drug Use: No  . Sexual Activity: Not Asked   Other Topics Concern  . 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 acetaminophen (TYLENOL) tablet 1,000 mg  1,000 mg Oral Q6H PRN Laverle Hobby, PA-C      . albuterol (PROVENTIL HFA;VENTOLIN HFA) 108 (90 Base) MCG/ACT inhaler 2 puff  2 puff Inhalation Q8H PRN Laverle Hobby, PA-C   2 puff at 05/29/15 2006  . alum & mag hydroxide-simeth (MAALOX/MYLANTA) 200-200-20 MG/5ML suspension 30 mL  30 mL Oral Q4H PRN Derrill Center, NP      . amiodarone (PACERONE) tablet 400 mg  400 mg Oral Daily Kerrie Buffalo, NP   400 mg at 05/30/15 0904  . apixaban (ELIQUIS) tablet 5 mg  5 mg Oral BID Laverle Hobby, PA-C   5 mg at  05/30/15 0904  . ARIPiprazole (ABILIFY) tablet 2 mg  2 mg Oral Daily Jenne Campus, MD   2 mg at 05/30/15 0904  . carvedilol (COREG) tablet 25 mg  25 mg Oral BID Laverle Hobby, PA-C   25 mg at 05/30/15 8453  . furosemide (LASIX) tablet 40 mg  40 mg Oral Daily Kerrie Buffalo, NP   40 mg at 05/30/15 0904  . HYDROcodone-acetaminophen (NORCO/VICODIN) 5-325 MG per tablet 1 tablet  1 tablet Oral Q8H PRN Kerrie Buffalo, NP   1 tablet at 05/30/15 0904  . levothyroxine (SYNTHROID, LEVOTHROID) tablet 75 mcg  75 mcg Oral QAC breakfast Kerrie Buffalo, NP    75 mcg at 05/30/15 [id number]  . magnesium hydroxide (MILK OF MAGNESIA) suspension 30 mL  30 mL Oral Daily PRN Derrill Center, NP      . nitroGLYCERIN (NITROSTAT) SL tablet 0.4 mg  0.4 mg Sublingual Q5 min PRN Laverle Hobby, PA-C      . potassium chloride SA (K-DUR,KLOR-CON) CR tablet 20 mEq  20 mEq Oral Daily Laverle Hobby, PA-C   20 mEq at 05/30/15 0903  . tiotropium (SPIRIVA) inhalation capsule 18 mcg  18 mcg Inhalation Daily PRN Laverle Hobby, PA-C      . traZODone (DESYREL) tablet 50 mg  50 mg Oral QHS PRN Jenne Campus, MD   50 mg at 05/29/15 2106    Lab Results:  Results for orders placed or performed during the hospital encounter of 05/24/15 (from the past 48 hour(s))  Glucose, capillary     Status: Abnormal   Collection Time: 05/29/15  8:30 PM  Result Value Ref Range   Glucose-Capillary 100 (H) 65 - 99 mg/dL  Basic metabolic panel     Status: Abnormal   Collection Time: 05/30/15  6:30 AM  Result Value Ref Range   Sodium 140 135 - 145 mmol/L   Potassium 3.8 3.5 - 5.1 mmol/L   Chloride 104 101 - 111 mmol/L   CO2 28 22 - 32 mmol/L   Glucose, Bld 95 65 - 99 mg/dL   BUN 15 6 - 20 mg/dL   Creatinine, Ser 1.29 (H) 0.61 - 1.24 mg/dL   Calcium 9.1 8.9 - 10.3 mg/dL   GFR calc non Af Amer >60 >60 mL/min   GFR calc Af Amer >60 >60 mL/min    Comment: (NOTE) The eGFR has been calculated using the CKD EPI equation. This calculation has not been validated in all clinical situations. eGFR's persistently <60 mL/min signify possible Chronic Kidney Disease.    Anion gap 8 5 - 15    Comment: Performed at [facility]    Blood Alcohol level:  Lab Results  Component Value Date   [facility] <5 05/23/2015    Physical Findings: AIMS: Facial and Oral Movements Muscles of Facial Expression: None, normal Lips and Perioral Area: None, normal Jaw: None, normal Tongue: None, normal,Extremity Movements Upper (arms, wrists, hands, fingers): None, normal Lower (legs, knees,  ankles, toes): None, normal, Trunk Movements Neck, shoulders, hips: None, normal, Overall Severity Severity of abnormal movements (highest score from questions above): None, normal Incapacitation due to abnormal movements: None, normal Patient's awareness of abnormal movements (rate only patient's report): No Awareness, Dental Status Current problems with teeth and/or dentures?: No Does patient usually wear dentures?: No  CIWA:    COWS:     Musculoskeletal: Strength & Muscle Tone: within normal limits Gait & Station: normal Patient leans: N/A  Psychiatric Specialty Exam:  Review of Systems  Psychiatric/Behavioral: Positive for depression. Negative for suicidal ideas. The patient is not nervous/anxious. Insomnia: stable.   All other systems reviewed and are negative.  no headache, no chest pain, no shortness of breath   Blood pressure 121/70, pulse 72, temperature 97.7 F (36.5 C), temperature source Oral, resp. rate 16, height 6' (1.829 m), weight 149.687 kg (330 lb), SpO2 97 %.Body mass index is 44.75 kg/(m^2).  General Appearance: Casual  Eye Contact::  Good  Speech:  Normal Rate  Volume:  Normal  Mood:  Less severely depressed, starting to improve gradually   Affect:  Remains constricted   Thought Process:  Linear  Orientation:  Other:  fully alert and attentive   Thought Content:  linear   Suicidal Thoughts:  No- at this time denies any suicidal ideations , and contracts for safety on the unit   Homicidal Thoughts:  No denies any violent ideations,  Memory:  recent and remote grossly intact   Judgement:  Improving   Insight:  Improving   Psychomotor Activity:  Decreased  Concentration:  Good  Recall:  Good  Fund of Knowledge:Good  Language: Good  Akathisia:  Negative  Handed:  Right  AIMS (if indicated):     Assets:  Desire for Improvement Resilience  ADL's:  Fair   Cognition: WNL  Sleep:  Number of Hours: 6    I agree with current treatment plan on 05/30/2015,  Patient seen face-to-face for psychiatric evaluation follow-up, chart reviewed. Reviewed the information documented and agree with the treatment plan.  Per MD Note on 05/28/2015-Of note: I have reviewed EKG findings ( increased QTc ) with Hospitalist Service  as follows :  Mg++ serum level normal. Felt to be function of biventricular pacemaker rhythm .   Treatment Plan Summary: Daily contact with patient to assess and evaluate symptoms and progress in treatment, Medication management, Plan inpatient admission  and medications as below    Encourage milieu participation to work on coping skills and symptom reduction  Discontinued the  Remeron 15 mgrs QHS for depression, insomnia-patient reports insurance will not cover this medication Increase Trazodone 100 mg PO QHS for  insomnia   Increase  Abilify 2 mgrs to 54ms QDAY for mood disorder, depression  Continue Synthroid for management of hypothyroidism  Treatment team working on disposition planning    TDerrill Center NP 05/30/2015, 9:07 AM

## 2015-05-31 DIAGNOSIS — I429 Cardiomyopathy, unspecified: Secondary | ICD-10-CM

## 2015-05-31 DIAGNOSIS — I428 Other cardiomyopathies: Secondary | ICD-10-CM | POA: Insufficient documentation

## 2015-05-31 MED ORDER — FLUOXETINE HCL 10 MG PO CAPS
10.0000 mg | ORAL_CAPSULE | Freq: Every day | ORAL | Status: DC
  Administered 2015-05-31 – 2015-06-02 (×3): 10 mg via ORAL
  Filled 2015-05-31 (×6): qty 1

## 2015-05-31 NOTE — Progress Notes (Signed)
Recreation Therapy Notes  Date: 04.03.2017 Time: 9:30am Location: [address] Dayroom   Group Topic: Stress Management  Goal Area(s) Addresses:  Patient will actively participate in stress management techniques presented during session.   Behavioral Response: Did not attend.   Laureen Ochs Erin Obando, LRT/CTRS        Kendall Justo L 05/31/2015 3:07 PM

## 2015-05-31 NOTE — Progress Notes (Signed)
Patient Demographics  Gary Kirk, is a 50 y.o. male, DOB - [birth year], DW:[id number]  Admit date - 05/24/2015   Admitting Physician Jenne Campus, MD  Outpatient Primary MD for the patient is No primary care provider on file.  LOS - 7   No chief complaint on file.      Admission HPI/Brief narrative: 50 yo gentleman with chronic systolic CHF, s/p PPM ICD placement, COPD, HTN, A fib, hypothyroidism.  Patient is admitted to [facility] since 3-27 for major depressive disorder. Hospitalist  were called to evaluate for EKG changes and elevation in serum creatinine level.   Subjective:   Gary Kirk today has, No headache, No chest pain, No abdominal pain - No Nausea, No new weakness tingling or numbness, No Cough - SOB.   Assessment & Plan    Active Problems:   MDD (major depressive disorder) (HCC)   MDD (major depressive disorder), recurrent episode, severe (HCC)   Severe episode of recurrent major depressive disorder, without psychotic features ([place])   Nonspecific abnormal electrocardiogram (ECG) (EKG)   Creatinine elevation  CKD stage III - Patient baseline creatinine 1.45-1.59 this is most recent at [facility] record in August 2016, patient with creatinine of 1.5 on admission, repeat is 1.29, this appears to be at baseline, continue to monitor, no further intervention at this point.  Persistent atrial fibrillation - Continue Eliquis for anticoagulation, heart rate controlled, continue with Coreg and amiodarone  Chronic systolic heart failure/nonischemic cardiomyopathy - Most recent EF on nuclear stress test in August 2016 is 28%, most recent echo in care over to wear with echo 11/2013 with EF 15%. - Management per primary patient cardiologist in [facility], he reports he recently moved permanently to [facility] and request start following with local cardiology group, arranged and new patient follow up  appointment with [facility] on 4/19 at 10:30 am, patient notified about appointment. - Patient to be euvolemic, continue with Lasix at current dose.  COPD - No active wheezing  Hypertension - blood pressure acceptable, continue with current regimen  MDD - Management per primary psychiatric team.  Medications  Scheduled Meds: . amiodarone  400 mg Oral Daily  . apixaban  5 mg Oral BID  . ARIPiprazole  5 mg Oral Daily  . carvedilol  25 mg Oral BID  . FLUoxetine  10 mg Oral Daily  . furosemide  40 mg Oral Daily  . levothyroxine  75 mcg Oral QAC breakfast  . potassium chloride SA  20 mEq Oral Daily   Continuous Infusions:  PRN Meds:.acetaminophen, albuterol, alum & mag hydroxide-simeth, HYDROcodone-acetaminophen, magnesium hydroxide, nitroGLYCERIN, tiotropium, traZODone   Lab Results  Component Value Date   PLT 197 05/23/2015    Antibiotics    Anti-infectives    None          Objective:   Filed Vitals:   05/30/15 0700 05/30/15 0701 05/31/15 0604 05/31/15 0605  BP: 136/88 121/70 124/79 114/77  Pulse: 89 72 89 86  Temp: 97.7 F (36.5 C)  97.8 F (36.6 C)   TempSrc:   Oral   Resp: 16  18   Height:      Weight:      SpO2:        Wt Readings from Last 3  Encounters:  05/24/15 149.687 kg (330 lb)  04/29/15 154.223 kg (340 lb)    No intake or output data in the 24 hours ending 05/31/15 1650   Physical Exam  Awake Alert, Oriented X 3, No new F.N deficits, Normal affect [place].AT,PERRAL Supple Neck,No JVD, No cervical lymphadenopathy appriciated.  Symmetrical Chest wall movement, Good air movement bilaterally, CTAB RRR,No Gallops,Rubs or new Murmurs, No Parasternal Heave +ve B.Sounds, Abd Soft, No tenderness, No organomegaly appriciated, No rebound - guarding or rigidity. No Cyanosis, Clubbing or edema, No new Rash or bruise    Data Review   Micro Results No results found for this or any previous visit (from the past 240 hour(s)).  Radiology Reports No  results found.   CBC No results for input(s): WBC, HGB, HCT, PLT, MCV, MCH, MCHC, RDW, LYMPHSABS, MONOABS, EOSABS, BASOSABS, BANDABS in the last 168 hours.  Invalid input(s): NEUTRABS, BANDSABD  Chemistries   Recent Labs Lab 05/27/15 1843 05/30/15 0630  NA 139 140  K 3.8 3.8  CL 103 104  CO2 24 28  GLUCOSE 118* 95  BUN 20 15  CREATININE 1.50* 1.29*  CALCIUM 8.7* 9.1  MG 1.7  --    ------------------------------------------------------------------------------------------------------------------ estimated creatinine clearance is 103.1 mL/min (by C-G formula based on Cr of 1.29). ------------------------------------------------------------------------------------------------------------------ No results for input(s): HGBA1C in the last 72 hours. ------------------------------------------------------------------------------------------------------------------ No results for input(s): CHOL, HDL, LDLCALC, TRIG, CHOLHDL, LDLDIRECT in the last 72 hours. ------------------------------------------------------------------------------------------------------------------ No results for input(s): TSH, T4TOTAL, T3FREE, THYROIDAB in the last 72 hours.  Invalid input(s): FREET3 ------------------------------------------------------------------------------------------------------------------ No results for input(s): VITAMINB12, FOLATE, FERRITIN, TIBC, IRON, RETICCTPCT in the last 72 hours.  Coagulation profile No results for input(s): INR, PROTIME in the last 168 hours.  No results for input(s): DDIMER in the last 72 hours.  Cardiac Enzymes No results for input(s): CKMB, TROPONINI, MYOGLOBIN in the last 168 hours.  Invalid input(s): CK ------------------------------------------------------------------------------------------------------------------ Invalid input(s): POCBNP     Time Spent in minutes   20 minutes   Taila Basinski M.D on 05/31/2015 at 4:50 PM  Between 7am to 7pm -  Pager - [phone]  After 7pm go to [email or web address] - password [facility]  [facility]   Office  [phone]

## 2015-05-31 NOTE — [facility] Suicide Risk Assessment (Signed)
[facility] INPATIENT:  Family/Significant Other Suicide Prevention Education  Suicide Prevention Education:  Education Completed; Antero Melroy, Pt's brother [phone], has been identified by the patient as the family member/significant other with whom the patient will be residing, and identified as the person(s) who will aid the patient in the event of a mental health crisis (suicidal ideations/suicide attempt).  With written consent from the patient, the family member/significant other has been provided the following suicide prevention education, prior to the and/or following the discharge of the patient.  The suicide prevention education provided includes the following:  Suicide risk factors  Suicide prevention and interventions  National Suicide Hotline telephone number  [facility] assessment telephone number  [facility] Emergency Assistance [place] and/or Residential Mobile Crisis Unit telephone number  Request made of family/significant other to:  Remove weapons (e.g., guns, rifles, knives), all items previously/currently identified as safety concern.    Remove drugs/medications (over-the-counter, prescriptions, illicit drugs), all items previously/currently identified as a safety concern.  The family member/significant other verbalizes understanding of the suicide prevention education information provided.  The family member/significant other agrees to remove the items of safety concern listed above.  Bo Mcclintock 05/31/2015, 3:25 PM

## 2015-05-31 NOTE — Progress Notes (Signed)
Patient ID: Gary Kirk, male   DOB: [birth year], 50 y.o.   MRN: [id number] [facility] MD Progress Note  05/31/2015 1:30 PM [facility]  MRN:  [id number] Subjective:   He reports partial improvement of mood , and states " I am definitely doing better than when I came in" ,  although still feels depressed, and describes ongoing sense of anxiety /vague apprehension. He does report he is feeling less irritable.  Denies medication side effects at this time    Objective : I have discussed case with treatment team and have met with patient. Patient reports partial improvement of mood , feels less depressed . Remains anxious, vaguely apprehensive, but as above, states this too is partially improved and he is reporting decreased irritability. Presents vaguely dysphoric and depressed on approach. Denies any suicidal ideations, denies any self injurious ideations . He states he did not tolerate Remeron well, so it has been stopped. He is tolerating Abilify well .  We have discussed antidepressant medication options, states he was on Zoloft in the past and did not tolerate it well. Has not been on Prozac and is agreeing to this trial. Group participation is limited, but has been less isolative and more visible on unit. No disruptive or agitated behaviors on unit .  Principal Problem: MDD , severe, no psychotic features  Diagnosis:   Patient Active Problem List   Diagnosis Date Noted  . Nonspecific abnormal electrocardiogram (ECG) (EKG) [R94.31]   . Creatinine elevation [R74.8]   . Severe episode of recurrent major depressive disorder, without psychotic features ([place]) [F33.2]   . Severe recurrent major depression without psychotic features ([place]) [F33.2] 05/24/2015  . MDD (major depressive disorder) ([place]) [F32.9] 05/24/2015  . MDD (major depressive disorder), recurrent episode, severe ([place]) [F33.2] 05/24/2015   Total Time spent with patient: 25 minutes     Past Medical History:  Past Medical  History  Diagnosis Date  . Hypertension   . CHF (congestive heart failure) ([place])   . A-fib ([place])   . COPD (chronic obstructive pulmonary disease) [facility])     Past Surgical History  Procedure Laterality Date  . Cardiac defibrillator placement     Family History: History reviewed. No pertinent family history.  Social History:  History  Alcohol Use No     History  Drug Use No    Social History   Social History  . Marital Status: Single    Spouse Name: N/A  . Number of Children: N/A  . Years of Education: N/A   Social History Main Topics  . Smoking status: Former Smoker    Quit date: 04/29/2014  . Smokeless tobacco: None  . Alcohol Use: No  . Drug Use: No  . Sexual Activity: Not Asked   Other Topics Concern  . 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 acetaminophen (TYLENOL) tablet 1,000 mg  1,000 mg Oral Q6H PRN Laverle Hobby, PA-C      . albuterol (PROVENTIL HFA;VENTOLIN HFA) 108 (90 Base) MCG/ACT inhaler 2 puff  2 puff Inhalation Q8H PRN Maurine Minister  Simon, PA-C   2 puff at 05/30/15 2102  . alum & mag hydroxide-simeth (MAALOX/MYLANTA) 200-200-20 MG/5ML suspension 30 mL  30 mL Oral Q4H PRN Derrill Center, NP      . amiodarone (PACERONE) tablet 400 mg  400 mg Oral Daily Kerrie Buffalo, NP   400 mg at 05/31/15 0809  . apixaban (ELIQUIS) tablet 5 mg  5 mg Oral BID Laverle Hobby, PA-C   5 mg at 05/31/15 0809  . ARIPiprazole (ABILIFY) tablet 5 mg  5 mg Oral Daily Derrill Center, NP   5 mg at 05/31/15 0809  .  carvedilol (COREG) tablet 25 mg  25 mg Oral BID Laverle Hobby, PA-C   25 mg at 05/31/15 0810  . furosemide (LASIX) tablet 40 mg  40 mg Oral Daily Kerrie Buffalo, NP   40 mg at 05/31/15 0809  . HYDROcodone-acetaminophen (NORCO/VICODIN) 5-325 MG per tablet 1 tablet  1 tablet Oral Q8H PRN Kerrie Buffalo, NP   1 tablet at 05/31/15 0811  . levothyroxine (SYNTHROID, LEVOTHROID) tablet 75 mcg  75 mcg Oral QAC breakfast Kerrie Buffalo, NP   75 mcg at 05/31/15 2841  . magnesium hydroxide (MILK OF MAGNESIA) suspension 30 mL  30 mL Oral Daily PRN Derrill Center, NP      . nitroGLYCERIN (NITROSTAT) SL tablet 0.4 mg  0.4 mg Sublingual Q5 min PRN Laverle Hobby, PA-C      . potassium chloride SA (K-DUR,KLOR-CON) CR tablet 20 mEq  20 mEq Oral Daily Laverle Hobby, PA-C   20 mEq at 05/31/15 0810  . tiotropium (SPIRIVA) inhalation capsule 18 mcg  18 mcg Inhalation Daily PRN Laverle Hobby, PA-C      . traZODone (DESYREL) tablet 100 mg  100 mg Oral QHS PRN Derrill Center, NP   100 mg at 05/30/15 2102    Lab Results:  Results for orders placed or performed during the hospital encounter of 05/24/15 (from the past 48 hour(s))  Glucose, capillary     Status: Abnormal   Collection Time: 05/29/15  8:30 PM  Result Value Ref Range   Glucose-Capillary 100 (H) 65 - 99 mg/dL  Basic metabolic panel     Status: Abnormal   Collection Time: 05/30/15  6:30 AM  Result Value Ref Range   Sodium 140 135 - 145 mmol/L   Potassium 3.8 3.5 - 5.1 mmol/L   Chloride 104 101 - 111 mmol/L   CO2 28 22 - 32 mmol/L   Glucose, Bld 95 65 - 99 mg/dL   BUN 15 6 - 20 mg/dL   Creatinine, Ser 1.29 (H) 0.61 - 1.24 mg/dL   Calcium 9.1 8.9 - 10.3 mg/dL   GFR calc non Af Amer >60 >60 mL/min   GFR calc Af Amer >60 >60 mL/min    Comment: (NOTE) The eGFR has been calculated using the CKD EPI equation. This calculation has not been validated in all clinical situations. eGFR's persistently <60 mL/min signify possible Chronic Kidney Disease.     Anion gap 8 5 - 15    Comment: Performed at [facility]    Blood Alcohol level:  Lab Results  Component Value Date   [facility] <5 05/23/2015    Physical Findings: AIMS: Facial and Oral Movements Muscles of Facial Expression: None, normal Lips and Perioral Area: None, normal Jaw: None, normal Tongue: None, normal,Extremity Movements Upper (arms, wrists, hands, fingers): None, normal Lower (legs, knees, ankles, toes): None, normal, Trunk Movements Neck, shoulders,  hips: None, normal, Overall Severity Severity of abnormal movements (highest score from questions above): None, normal Incapacitation due to abnormal movements: None, normal Patient's awareness of abnormal movements (rate only patient's report): No Awareness, Dental Status Current problems with teeth and/or dentures?: No Does patient usually wear dentures?: No  CIWA:    COWS:     Musculoskeletal: Strength & Muscle Tone: within normal limits Gait & Station: normal Patient leans: N/A  Psychiatric Specialty Exam: ROS no headache, no chest pain, no shortness of breath   Blood pressure 114/77, pulse 86, temperature 97.8 F (36.6 C), temperature source Oral, resp. rate 18, height 6' (1.829 m), weight 330 lb (149.687 kg), SpO2 97 %.Body mass index is 44.75 kg/(m^2).  General Appearance: Fairly Groomed  Engineer, water::  Good  Speech:  Normal Rate  Volume:  Normal  Mood:  Reports improving mood , less depressed   Affect:  Remains vaguely constricted, blunted   Thought Process:  Linear  Orientation:  Other:  fully alert and attentive   Thought Content:  linear   Suicidal Thoughts:  No- at this time denies any suicidal ideations , and contracts for safety on the unit   Homicidal Thoughts:  No denies any violent ideations, but states he feels irritable   Memory:  recent and remote grossly intact   Judgement:  Improving   Insight:  Improving   Psychomotor Activity:  More visible on unit   Concentration:   Good  Recall:  Good  Fund of Knowledge:Good  Language: Good  Akathisia:  Negative  Handed:  Right  AIMS (if indicated):     Assets:  Desire for Improvement Resilience  ADL's:  Fair   Cognition: WNL  Sleep:  Number of Hours: 6.75  Assessment - patient reports partially improved mood and less depression. At this time denies any suicidal ideations . He remains anxious, and tends to avoid groups due in part to social anxiety. He states he feels less irritable. As he improves he is starting to focus more on disposition plans and states he is planning on returning to live with family member on discharge. Did not tolerate Remeron well - he does agree to antidepressant trial and we discussed options - agrees to [facility] trial. I have reviewed case via phone with Cardiology Consultant- due to elevated QTc : no reason for concern about elevated QTc at this time as patient on bi V pacemaker.  No need for further work up of this issue at the present time- will need outpatient cardiology follow ups . Marland Kitchen   Treatment Plan Summary: Daily contact with patient to assess and evaluate symptoms and progress in treatment, Medication management, Plan inpatient admission  and medications as below  Encourage milieu participation to work on coping skills and symptom reduction  Start Prozac 10 mgrs QDAY for depression  Continue  Abilify now at 5  mgrs QDAY for mood disorder, depression  Continue Synthroid for management of hypothyroidism  Treatment team working on disposition planning    Neita Garnet, MD 05/31/2015, 1:30 PM

## 2015-05-31 NOTE — Progress Notes (Signed)
D:Pt reports passive si thoughts and back pain. Pt has been in his bed this morning. He rates depression and anxiety as a 4 on 0-10 scale with 10 being the most.  A:Gave medications as ordered. Gave prn medication for back pain. Offered support, encouragement and 15 minute checks. R:Pt contracts with staff for safety. Safety maintained on the unit.

## 2015-05-31 NOTE — Plan of Care (Signed)
Problem: Diagnosis: Increased Risk For Suicide Attempt Goal: STG-Patient Will Comply With Medication Regime Outcome: Progressing Pt compliant with medication regime     

## 2015-05-31 NOTE — BHH Group Notes (Signed)
[place] LCSW Group Therapy  05/31/2015 1:15pm  Pt did not attend, declined invitation.    [name], [address] 05/31/2015 2:36 PM

## 2015-05-31 NOTE — Progress Notes (Signed)
Patient ID: Gary Kirk, male   DOB: 16-Dec-1965, 50 y.o.   MRN: [id number] D: Patient calm and cooperative on the unit. Mood and affect appeared depressed and sad. Pt reports tolerating medication regime. Denies SI/HI/AVH.No behavioral issues noted.  A: Support and encouragement offered as needed. Medications administered as prescribed.  R: Patient is safe. Will continue to monitor patient for safety and stability.

## 2015-05-31 NOTE — BHH Group Notes (Signed)
[facility] LCSW Aftercare Discharge Planning Group Note  05/31/2015 8:45 AM  Pt did not attend, declined invitation.   [name], [place] 05/31/2015 11:50 AM

## 2015-05-31 NOTE — Progress Notes (Signed)
Adult Psychoeducational Group Note  Date:  05/31/2015 Time:  8:20 PM  Group Topic/Focus:  Wrap-Up Group:   The focus of this group is to help patients review their daily goal of treatment and discuss progress on daily workbooks.  Participation Level:  Did Not Attend  Pt was in bed during wrap-up group.    Lincoln Brigham 05/31/2015, 9:14 PM

## 2015-06-01 NOTE — Progress Notes (Signed)
Adult Psychoeducational Group Note  Date:  06/01/2015 Time:  8:20 PM  Group Topic/Focus:  Wrap-Up Group:   The focus of this group is to help patients review their daily goal of treatment and discuss progress on daily workbooks.  Participation Level:  Active  Participation Quality:  Appropriate  Affect:  Appropriate  Cognitive:  Appropriate  Insight: Appropriate  Engagement in Group:  Engaged  Modes of Intervention:  Discussion  Additional Comments:  Pt was pleasant. Pt rated his overall day a 5 out of 10 and stated that he had a pretty good day because he learned that he is getting discharged tomorrow. Pt reported that he did not have a goal for the day.   Lincoln Brigham 06/01/2015, 9:06 PM

## 2015-06-01 NOTE — BHH Group Notes (Signed)
[place] LCSW Group Therapy 06/01/2015 1:15 PM  Type of Therapy: Group Therapy- Feelings about Diagnosis  Participation Level: Minimal  Participation Quality:  Reserved  Affect:  Flat  Cognitive: Alert and Oriented   Insight:  Developing   Engagement in Therapy: Developing/Improving and Engaged   Modes of Intervention: Clarification, Confrontation, Discussion, Education, Exploration, Limit-setting, Orientation, Problem-solving, Rapport Building, Art therapist, Socialization and Support  Description of Group:   This group will allow patients to explore their thoughts and feelings about diagnoses they have received. Patients will be guided to explore their level of understanding and acceptance of these diagnoses. Facilitator will encourage patients to process their thoughts and feelings about the reactions of others to their diagnosis, and will guide patients in identifying ways to discuss their diagnosis with significant others in their lives. This group will be process-oriented, with patients participating in exploration of their own experiences as well as giving and receiving support and challenge from other group members.  Summary of Progress/Problems:  Pt participated minimally but did report frustration with stigma faced in society. Pt also commented on how common he thinks mental illness is as some of his family members are most likely undiagnosed.  Therapeutic Modalities:   Cognitive Behavioral Therapy Solution Focused Therapy Motivational Interviewing Relapse Prevention Therapy  [name], LCSWA 06/01/2015 3:33 PM

## 2015-06-01 NOTE — Progress Notes (Signed)
Recreation Therapy Notes  Animal-Assisted Activity (AAA) Program Checklist/Progress Notes Patient Eligibility Criteria Checklist & Daily Group note for Rec Tx Intervention  Date: 04.04.2017 Time: 2:45pm Location: [address]    AAA/T Program Assumption of Risk Form signed by Patient/ or Parent Legal Guardian Yes  Patient is free of allergies or sever asthma Yes  Patient reports no fear of animals Yes  Patient reports no history of cruelty to animals Yes  Patient understands his/her participation is voluntary Yes  Behavioral Response: Did not attend.     Laureen Ochs Tracye Szuch, LRT/CTRS        Teliyah Royal L 06/01/2015 3:12 PM

## 2015-06-01 NOTE — Progress Notes (Signed)
Patient has new patient follow up appointment with [facility] on 4/19 at 10:30 am, patient notified about appointment. We will sign off, please contacted hospitalist if any further questions arise. Phillips Climes MD [phone]

## 2015-06-01 NOTE — Progress Notes (Signed)
Patient ID: Gary Kirk, male   DOB: 07/29/1965, 49 y.o.   MRN: [id number] D: Patient calm and cooperative on the unit. Mood and affect appeared depressed and flat. Pt endorses passive suicidal ideation without a plan. Pt denies HI/AVH.No behavioral issues noted.  A: Support and encouragement offered as needed.  R: Patient is safe. Will continue to monitor patient for safety and stability.

## 2015-06-01 NOTE — Progress Notes (Signed)
Patient ID: Gary Kirk, male   DOB: 1965/03/21, 50 y.o.   MRN: [id number] [facility] MD Progress Note  06/01/2015 11:24 AM Gary Kirk  MRN:  [id number] Subjective:   He reports gradually improving mood and is not feeling as depressed. He is starting to think about discharge planning , and states his plan is to go back to live with his brother. Denies medication side effects at this time .    Objective : I have discussed case with treatment team and have met with patient. Continues to present vaguely irritable and constricted in affect but states he is feeling better, less depressed, and more his " usual self ". Denies any current suicidal ideations . Remains somewhat isolative, which he states is due to his social anxiety and apprehension in group settings. He is less irritable and denies anger or irritability at this time. He has been  More visible on unit and has been going to meals in the cafeteria and has spent more time in day room. States " I just need to take it easy, because of my heart " ( has a history of cardiomyopathy, with decreased ejection fraction and has pacemaker ) Denies shortness of breath at rest/room air, and is able to ambulate on unit without dyspnea. Denies chest pain today and does not appear in any acute distress . Plans to establish outpatient cardiology care in [place] area after discharge. No medication side effects at this time  Principal Problem: MDD , severe, no psychotic features  Diagnosis:   Patient Active Problem List   Diagnosis Date Noted  . Non-ischemic cardiomyopathy ([place]) [I42.9]   . Nonspecific abnormal electrocardiogram (ECG) (EKG) [R94.31]   . Creatinine elevation [R74.8]   . Severe episode of recurrent major depressive disorder, without psychotic features ([place]) [F33.2]   . Severe recurrent major depression without psychotic features ([place]) [F33.2] 05/24/2015  . MDD (major depressive disorder) ([place]) [F32.9] 05/24/2015  . MDD (major  depressive disorder), recurrent episode, severe ([place]) [F33.2] 05/24/2015   Total Time spent with patient: 20 minutes     Past Medical History:  Past Medical History  Diagnosis Date  . Hypertension   . CHF (congestive heart failure) ([place])   . A-fib ([place])   . COPD (chronic obstructive pulmonary disease) [facility])     Past Surgical History  Procedure Laterality Date  . Cardiac defibrillator placement     Family History: History reviewed. No pertinent family history.  Social History:  History  Alcohol Use No     History  Drug Use No    Social History   Social History  . Marital Status: Single    Spouse Name: N/A  . Number of Children: N/A  . Years of Education: N/A   Social History Main Topics  . Smoking status: Former Smoker    Quit date: 04/29/2014  . Smokeless tobacco: None  . Alcohol Use: No  . Drug Use: No  . Sexual Activity: Not Asked   Other Topics Concern  . 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 acetaminophen (TYLENOL) tablet  1,000 mg  1,000 mg Oral Q6H PRN Laverle Hobby, PA-C      . albuterol (PROVENTIL HFA;VENTOLIN HFA) 108 (90 Base) MCG/ACT inhaler 2 puff  2 puff Inhalation Q8H PRN Laverle Hobby, PA-C   2 puff at 05/30/15 2102  . alum & mag hydroxide-simeth (MAALOX/MYLANTA) 200-200-20 MG/5ML suspension 30 mL  30 mL Oral Q4H PRN Derrill Center, NP      . amiodarone (PACERONE) tablet 400 mg  400 mg Oral Daily Kerrie Buffalo, NP   400 mg at 06/01/15 0740  . apixaban (ELIQUIS) tablet 5 mg  5 mg  Oral BID Laverle Hobby, PA-C   5 mg at 06/01/15 0740  . ARIPiprazole (ABILIFY) tablet 5 mg  5 mg Oral Daily Derrill Center, NP   5 mg at 06/01/15 0740  . carvedilol (COREG) tablet 25 mg  25 mg Oral BID Laverle Hobby, PA-C   25 mg at 06/01/15 0740  . FLUoxetine (PROZAC) capsule 10 mg  10 mg Oral Daily Jenne Campus, MD   10 mg at 06/01/15 0740  . furosemide (LASIX) tablet 40 mg  40 mg Oral Daily Kerrie Buffalo, NP   40 mg at 06/01/15 0740  . HYDROcodone-acetaminophen (NORCO/VICODIN) 5-325 MG per tablet 1 tablet  1 tablet Oral Q8H PRN Kerrie Buffalo, NP   1 tablet at 06/01/15 0740  . levothyroxine (SYNTHROID, LEVOTHROID) tablet 75 mcg  75 mcg Oral QAC breakfast Kerrie Buffalo, NP   75 mcg at 06/01/15 7829  . magnesium hydroxide (MILK OF MAGNESIA) suspension 30 mL  30 mL Oral Daily PRN Derrill Center, NP      . nitroGLYCERIN (NITROSTAT) SL tablet 0.4 mg  0.4 mg Sublingual Q5 min PRN Laverle Hobby, PA-C      . potassium chloride SA (K-DUR,KLOR-CON) CR tablet 20 mEq  20 mEq Oral Daily Laverle Hobby, PA-C   20 mEq at 06/01/15 0740  . tiotropium (SPIRIVA) inhalation capsule 18 mcg  18 mcg Inhalation Daily PRN Laverle Hobby, PA-C      . traZODone (DESYREL) tablet 100 mg  100 mg Oral QHS PRN Derrill Center, NP   100 mg at 05/30/15 2102    Lab Results:  No results found for this or any previous visit (from the past 48 hour(s)).  Blood Alcohol level:  Lab Results  Component Value Date   ETH <5 05/23/2015    Physical Findings: AIMS: Facial and Oral Movements Muscles of Facial Expression: None, normal Lips and Perioral Area: None, normal Jaw: None, normal Tongue: None, normal,Extremity Movements Upper (arms, wrists, hands, fingers): None, normal Lower (legs, knees, ankles, toes): None, normal, Trunk Movements Neck, shoulders, hips: None, normal, Overall Severity Severity of abnormal movements (highest score from questions above): None, normal Incapacitation due to abnormal movements:  None, normal Patient's awareness of abnormal movements (rate only patient's report): No Awareness, Dental Status Current problems with teeth and/or dentures?: No Does patient usually wear dentures?: No  CIWA:    COWS:     Musculoskeletal: Strength & Muscle Tone: within normal limits Gait & Station: normal Patient leans: N/A  Psychiatric Specialty Exam: ROS no headache, no chest pain, no shortness of breath at room air   Blood pressure 115/68, pulse 73, temperature 97.7 F (36.5 C), temperature source Oral, resp. rate 16, height 6' (1.829 m), weight 330 lb (149.687 kg), SpO2 97 %.Body mass index is 44.75 kg/(m^2).  General Appearance: Fairly Groomed  Engineer, water::  Good  Speech:  Normal Rate  Volume:  Normal  Mood:  States his mood is improving and that he is less depressed   Affect:  Remains vaguely constricted in affect   Thought Process:  Linear  Orientation:  Other:  fully alert and attentive   Thought Content:  linear   Suicidal Thoughts:  No- at this time denies any suicidal ideations , and contracts for safety on the unit   Homicidal Thoughts:  No denies any violent ideations, but states he feels irritable   Memory:  recent and remote grossly intact   Judgement:  Improving   Insight:  Improving   Psychomotor Activity:  More visible on unit   Concentration:  Good  Recall:  Good  Fund of Knowledge:Good  Language: Good  Akathisia:  Negative  Handed:  Right  AIMS (if indicated):     Assets:  Desire for Improvement Resilience  ADL's:  Fair   Cognition: WNL  Sleep:  Number of Hours: 6.5  Assessment - patient reports he is feeling  Better and feels his depression is lifting. He does continue to present with a relatively constricted, blunted affect, however. He denies any suicidal ideations . As he improves he is focusing more on discharge planning and states he intends to go back to live with his brother. He is more future oriented and states he intends to establish  outpatient cardiac care locally after discharge. At this time tolerating medications well  .   Treatment Plan Summary: Daily contact with patient to assess and evaluate symptoms and progress in treatment, Medication management, Plan inpatient admission  and medications as below  Encourage milieu participation to work on coping skills and symptom reduction  Continue Prozac 10 mgrs QDAY for depression  Continue  Abilify now at 5  mgrs QDAY for mood disorder, depression  Continue Synthroid for management of hypothyroidism  Treatment team working on disposition planning    Neita Garnet, MD 06/01/2015, 11:24 AM

## 2015-06-01 NOTE — Progress Notes (Signed)
D-At am med pass requested something for pain. States he thought he was superman when he was young and as a result has a bad back. Reports his pain as a 6 and after meds he is about a 2 or a 3 which is his baseline. He is able to contract for safety. Compliant with medications. A-Support offered. Monitored for safety. Medications as ordered. R-No further complaints other than initial back pain.

## 2015-06-02 MED ORDER — POTASSIUM CHLORIDE CRYS ER 20 MEQ PO TBCR: 20.0000 meq | EXTENDED_RELEASE_TABLET | Freq: Every day | ORAL | Status: DC

## 2015-06-02 MED ORDER — TIOTROPIUM BROMIDE MONOHYDRATE 18 MCG IN CAPS: 18.0000 ug | ORAL_CAPSULE | Freq: Every day | RESPIRATORY_TRACT | Status: DC | PRN

## 2015-06-02 MED ORDER — CARVEDILOL 25 MG PO TABS: 25.0000 mg | ORAL_TABLET | Freq: Two times a day (BID) | ORAL | Status: DC

## 2015-06-02 MED ORDER — APIXABAN 5 MG PO TABS: 5.0000 mg | ORAL_TABLET | Freq: Two times a day (BID) | ORAL | Status: DC

## 2015-06-02 MED ORDER — LEVOTHYROXINE SODIUM 75 MCG PO TABS: 75.0000 ug | ORAL_TABLET | Freq: Every day | ORAL | Status: DC

## 2015-06-02 MED ORDER — AMIODARONE HCL 400 MG PO TABS: 400.0000 mg | ORAL_TABLET | Freq: Every day | ORAL | Status: DC

## 2015-06-02 MED ORDER — TRAZODONE HCL 100 MG PO TABS
100.0000 mg | ORAL_TABLET | Freq: Every evening | ORAL | Status: DC | PRN
Start: 2015-06-02 — End: 2016-05-12

## 2015-06-02 MED ORDER — FLUOXETINE HCL 10 MG PO CAPS: 10.0000 mg | ORAL_CAPSULE | Freq: Every day | ORAL | Status: DC

## 2015-06-02 MED ORDER — FUROSEMIDE 40 MG PO TABS: 40.0000 mg | ORAL_TABLET | Freq: Every day | ORAL | Status: DC

## 2015-06-02 MED ORDER — ARIPIPRAZOLE 5 MG PO TABS: 5.0000 mg | ORAL_TABLET | Freq: Every day | ORAL | Status: DC

## 2015-06-02 NOTE — Tx Team (Signed)
Interdisciplinary Treatment Plan Update (Adult) Date: 06/02/2015   Date: 06/02/2015 10:37 AM  Progress in Treatment:  Attending groups: Intermittently  Participating in groups: Minimally  Taking medication as prescribed: Yes  Tolerating medication: Yes  Family/Significant othe contact made: Yes with brother Patient understands diagnosis: Yes AEB seeking help with depression and anxiety Discussing patient identified problems/goals with staff: Yes  Medical problems stabilized or resolved: Yes  Denies suicidal/homicidal ideation: Yes Patient has not harmed self or Others: Yes   New problem(s) identified: None identified at this time.   Discharge Plan or Barriers: Pt will return home and follow-up with outpatient resources  Additional comments:  Patient and CSW reviewed pt's identified goals and treatment plan. Patient verbalized understanding and agreed to treatment plan. CSW reviewed [facility] "Discharge Process and Patient Involvement" Form. Pt verbalized understanding of information provided and signed form.   Reason for Continuation of Hospitalization:  Anxiety Depression Medication stabilization Suicidal ideation  Estimated length of stay: 0 days  Review of initial/current patient goals per problem list:   1.  Goal(s): Patient will participate in aftercare plan  Met:  Yes  Target date: 3-5 days from date of admission   As evidenced by: Patient will participate within aftercare plan AEB aftercare provider and housing plan at discharge being identified.  05/25/15: Pt will return home and follow-up with outpatient resources   2.  Goal (s): Patient will exhibit decreased depressive symptoms and suicidal ideations.  Met:  Yes  Target date: 3-5 days from date of admission   As evidenced by: Patient will utilize self rating of depression at 3 or below and demonstrate decreased signs of depression or be deemed stable for discharge by MD.  05/25/15: Pt rates depression at 4/10;  endorses passive SI  05/28/15: Pt continues to rate depression at 4/10; endorses passive SI  06/02/15: Pt rates depression at 2/10; denies SI  3.  Goal(s): Patient will demonstrate decreased signs and symptoms of anxiety.  Met:  Adequate for DC  Target date: 3-5 days from date of admission   As evidenced by: Patient will utilize self rating of anxiety at 3 or below and demonstrated decreased signs of anxiety, or be deemed stable for discharge by MD  05/25/15: Pt rates anxiety at 5/10  05/28/15: Pt rates anxiety at 5/10  06/02/15: MD feels that Pt's symptoms have decreased to the point that they can be managed in an outpatient setting.   Attendees:  Patient:    Family:    Physician: Dr. Parke Poisson, MD  06/02/2015 10:37 AM  Nursing: Lars Pinks, RN Case manager  06/02/2015 10:37 AM  Clinical Social Worker [name], [place] 06/02/2015 10:37 AM  Other: Tilden Fossa, [place] 06/02/2015 10:37 AM  Clinical: Darrol Angel, RN  06/02/2015 10:37 AM  Other: , RN Charge Nurse 06/02/2015 10:37 AM  Other: Hilda Lias, [place], [place] Social Work [phone]

## 2015-06-02 NOTE — Discharge Summary (Signed)
Physician Discharge Summary Note  Patient:  Gary Kirk is an 50 y.o., male MRN:  [id number] DOB:  1965/11/29 Patient phone:  [phone] (home)  Patient address:   [address] [place] [address],  Total Time spent with patient: 30 minutes  Date of Admission:  05/24/2015 Date of Discharge: 06/02/2015  Reason for Admission:  Worsening depression  Principal Problem: MDD (major depressive disorder), recurrent episode, severe [facility]) Discharge Diagnoses: Patient Active Problem List   Diagnosis Date Noted  . MDD (major depressive disorder), recurrent episode, severe ([place]) [F33.2] 05/24/2015    Priority: High  . Non-ischemic cardiomyopathy ([place]) [I42.9]   . Nonspecific abnormal electrocardiogram (ECG) (EKG) [R94.31]   . Creatinine elevation [R74.8]   . Severe episode of recurrent major depressive disorder, without psychotic features ([place]) [F33.2]   . Severe recurrent major depression without psychotic features ([place]) [F33.2] 05/24/2015  . MDD (major depressive disorder) ([place]) [F32.9] 05/24/2015    Past Psychiatric History:  See above noted  Past Medical History:  Past Medical History  Diagnosis Date  . Hypertension   . CHF (congestive heart failure) ([place])   . A-fib ([place])   . COPD (chronic obstructive pulmonary disease) [facility])     Past Surgical History  Procedure Laterality Date  . Cardiac defibrillator placement     Family History: History reviewed. No pertinent family history. Family Psychiatric  History:  denied Social History:  History  Alcohol Use No     History  Drug Use No    Social History   Social History  . Marital Status: Single    Spouse Name: N/A  . Number of Children: N/A  . Years of Education: N/A   Social History Main Topics  . Smoking status: Former Smoker    Quit date: 04/29/2014  . Smokeless tobacco: None  . Alcohol Use: No  . Drug Use: No  . Sexual Activity: Not Asked   Other Topics Concern  . None   Social History Narrative     Hospital Course:   Gary Kirk, 50 year old man, reported that over the last month or two his depression worsened.  He also reported suicidal ideations by walking into traffic.    Gary Kirk was admitted for MDD (major depressive disorder), recurrent episode, severe ([place]) and crisis management.  He was treated with Prozac 10 mg for depression, Abilify now at 5 mg for mood disorder, depression and Synthroid for management of hypothyroidism.  Medical problems were identified and treated as needed.  Home medications were restarted as appropriate.  Improvement was monitored by observation and Gary Kirk daily report of symptom reduction.  Emotional and mental status was monitored by daily self inventory reports completed by Gary Kirk and clinical staff.  Patient reported continued improvement, denied any new concerns.  Patient had been compliant on medications and denied side effects.  Support and encouragement was provided.    Patient did well during inpatient stay.  At time of discharge, patient rated both depression and anxiety levels to be manageable and minimal.  Patient was able to identify the triggers of emotional crises and de-stabilizations.  Patient identified the positive things in life that would help in dealing with feelings of loss, depression and unhealthy or abusive tendencies.         Gary Kirk was evaluated by the treatment team for stability and plans for continued recovery upon discharge.  He was offered further treatment options upon discharge including Residential, Intensive Outpatient and  Outpatient treatment.  He will follow up with agency listed below for medication management and counseling.  Patient also has a cardiologist appt scheduled that patient was advised to make.  Encouraged patient to maintain satisfactory support network and home environment.  Advised to adhere to medication compliance and outpatient treatment follow up.       Gary Kirk motivation was an integral factor for scheduling further treatment.  Employment, transportation, bed availability, health status, family support, and any pending legal issues were also considered during his hospital stay.  Upon completion of this admission the patient was both mentally and medically stable for discharge denying suicidal/homicidal ideation, auditory/visual/tactile hallucinations, delusional thoughts and paranoia.       Physical Findings: AIMS: Facial and Oral Movements Muscles of Facial Expression: None, normal Lips and Perioral Area: None, normal Jaw: None, normal Tongue: None, normal,Extremity Movements Upper (arms, wrists, hands, fingers): None, normal Lower (legs, knees, ankles, toes): None, normal, Trunk Movements Neck, shoulders, hips: None, normal, Overall Severity Severity of abnormal movements (highest score from questions above): None, normal Incapacitation due to abnormal movements: None, normal Patient's awareness of abnormal movements (rate only patient's report): No Awareness, Dental Status Current problems with teeth and/or dentures?: No Does patient usually wear dentures?: No  CIWA:    COWS:     Musculoskeletal: Strength & Muscle Tone: within normal limits Gait & Station: normal Patient leans: N/A  Psychiatric Specialty Exam:  See MD SRA Review of Systems  Psychiatric/Behavioral: Negative for depression and hallucinations. The patient is not nervous/anxious.   All other systems reviewed and are negative.   Blood pressure 110/67, pulse 87, temperature 97.8 F (36.6 C), temperature source Oral, resp. rate 12, height 6' (1.829 m), weight 149.687 kg (330 lb), SpO2 97 %.Body mass index is 44.75 kg/(m^2).  Have you used any form of tobacco in the last 30 days? (Cigarettes, Smokeless Tobacco, Cigars, and/or Pipes): No  Has this patient used any form of tobacco in the last 30 days? (Cigarettes, Smokeless Tobacco, Cigars, and/or Pipes) Yes,  N/A  Blood Alcohol level:  Lab Results  Component Value Date   ETH <5 [id number]    Metabolic Disorder Labs:  No results found for: HGBA1C, MPG No results found for: PROLACTIN No results found for: CHOL, TRIG, HDL, CHOLHDL, VLDL, LDLCALC  See Psychiatric Specialty Exam and Suicide Risk Assessment completed by Attending Physician prior to discharge.  Discharge destination:  Home  Is patient on multiple antipsychotic therapies at discharge:  No   Has Patient had three or more failed trials of antipsychotic monotherapy by history:  No  Recommended Plan for Multiple Antipsychotic Therapies: NA     Medication List    STOP taking these medications        acetaminophen 500 MG tablet  Commonly known as:  TYLENOL     albuterol (2.5 MG/3ML) 0.083% nebulizer solution  Commonly known as:  PROVENTIL     aspirin EC 81 MG tablet     HYDROcodone-acetaminophen 5-325 MG tablet  Commonly known as:  NORCO/VICODIN     nitroGLYCERIN 0.4 MG SL tablet  Commonly known as:  NITROSTAT     PROAIR HFA 108 (90 Base) MCG/ACT inhaler  Generic drug:  albuterol      TAKE these medications      Indication   amiodarone 400 MG tablet  Commonly known as:  PACERONE  Take 1 tablet (400 mg total) by mouth daily.   Indication:  Atrial Fibrillation     apixaban 5  MG Tabs tablet  Commonly known as:  ELIQUIS  Take 1 tablet (5 mg total) by mouth 2 (two) times daily.   Indication:  bloot clot prevention     ARIPiprazole 5 MG tablet  Commonly known as:  ABILIFY  Take 1 tablet (5 mg total) by mouth daily.   Indication:  mood stabilization     carvedilol 25 MG tablet  Commonly known as:  COREG  Take 1 tablet (25 mg total) by mouth 2 (two) times daily.   Indication:  High Blood Pressure of Unknown Cause     FLUoxetine 10 MG capsule  Commonly known as:  PROZAC  Take 1 capsule (10 mg total) by mouth daily.   Indication:  Major Depressive Disorder     furosemide 40 MG tablet  Commonly known as:   LASIX  Take 1 tablet (40 mg total) by mouth daily.   Indication:  High Blood Pressure     levothyroxine 75 MCG tablet  Commonly known as:  SYNTHROID, LEVOTHROID  Take 1 tablet (75 mcg total) by mouth daily before breakfast.   Indication:  Underactive Thyroid     potassium chloride SA 20 MEQ tablet  Commonly known as:  K-DUR,KLOR-CON  Take 1 tablet (20 mEq total) by mouth daily.   Indication:  Low Amount of Potassium in the Blood     tiotropium 18 MCG inhalation capsule  Commonly known as:  SPIRIVA HANDIHALER  Place 1 capsule (18 mcg total) into inhaler and inhale daily as needed (for shortness of breath).   Indication:  Chronic Obstructive Lung Disease     traZODone 100 MG tablet  Commonly known as:  DESYREL  Take 1 tablet (100 mg total) by mouth at bedtime as needed for sleep.   Indication:  Trouble Sleeping       Follow-up Information    Follow up with [facility].   Specialty:  Behavioral Health   Why:  Please walk-in between 8am-3pm Monday-Friday to be evaluated for medication management and therapy. Please try to arrive early and be prepared for significant wait times. At this appt, recurring appts will be scheduled   Contact information:   [place] [address] [phone]       Follow up with Candee Furbish, MD On 06/16/2015.   Specialty:  Cardiology   Why:  appt is 10:30 am   Contact information:   [address] [place] [address] [phone]       Follow-up recommendations:  Activity:  as tol Diet:  as tol  Comments:  1.  Take all your medications as prescribed.   2.  Report any adverse side effects to outpatient provider. 3.  Patient instructed to not use alcohol or illegal drugs while on prescription medicines. 4.  In the event of worsening symptoms, instructed patient to call 911, the crisis hotline or go to nearest emergency room for evaluation of symptoms.  Signed: Janett Labella, NP [facility] 06/02/2015, 12:31 PM    Patient seen, Suicide Assessment Completed.  Disposition Plan Reviewed

## 2015-06-02 NOTE — Progress Notes (Signed)
Patient ID: Gary Kirk, male   DOB: 11/08/1965, 50 y.o.   MRN: [id number] D: Patient calm and cooperative on the unit. Pt affect a lot brighter than previous days . Pt excited about possible discharge tomorrow. Mood and affect appeared depressed. Pt denies SI/HI/AVH. No behavior issues noted.  A: Support and encouragement offered as needed.  R: Patient is safe. Will continue to monitor for safety and stability.

## 2015-06-02 NOTE — Progress Notes (Signed)
Pt discharged home with his brother. Pt was ambulatory, stable and appreciative at that time. All papers and prescriptions were given and valuables returned. Verbal understanding expressed. Denies SI/HI and A/VH. Pt given opportunity to express concerns and ask questions.  

## 2015-06-02 NOTE — Progress Notes (Signed)
  [facility] Adult Case Management Discharge Plan :  Will you be returning to the same living situation after discharge:  Yes,  Pt will return to brother's house At discharge, do you have transportation home?: Yes,  Pt brother to pick up Do you have the ability to pay for your medications: Yes,  Pt provided with prescriptions  Release of information consent forms completed and in the chart;  Patient's signature needed at discharge.  Patient to Follow up at: Follow-up Information    Follow up with [facility].   Specialty:  Behavioral Health   Why:  Please walk-in between 8am-3pm Monday-Friday to be evaluated for medication management and therapy. Please try to arrive early and be prepared for significant wait times. At this appt, recurring appts will be scheduled   Contact information:   [place] [address] [phone]       Go to Candee Furbish, MD.   Specialty:  Cardiology   Why:  Appointment scheduled 4/19 at 10:30 AM   Contact information:   [id number] [place] [address] [phone]       Next level of care provider has access to [place] and Suicide Prevention discussed: Yes,  with brother; see SPE note for further details  Have you used any form of tobacco in the last 30 days? (Cigarettes, Smokeless Tobacco, Cigars, and/or Pipes): No  Has patient been referred to the Quitline?: N/A patient is not a smoker  Patient has been referred for addiction treatment: N/A  Bo Mcclintock 06/02/2015, 10:39 AM

## 2015-06-02 NOTE — [facility] Suicide Risk Assessment (Addendum)
[facility] Discharge Suicide Risk Assessment   Principal Problem:  Major Depression  Discharge Diagnoses:  Patient Active Problem List   Diagnosis Date Noted  . Non-ischemic cardiomyopathy ([place]) [I42.9]   . Nonspecific abnormal electrocardiogram (ECG) (EKG) [R94.31]   . Creatinine elevation [R74.8]   . Severe episode of recurrent major depressive disorder, without psychotic features ([place]) [F33.2]   . Severe recurrent major depression without psychotic features ([place]) [F33.2] 05/24/2015  . MDD (major depressive disorder) ([place]) [F32.9] 05/24/2015  . MDD (major depressive disorder), recurrent episode, severe ([place]) [F33.2] 05/24/2015    Total Time spent with patient: 30 minutes  Musculoskeletal: Strength & Muscle Tone: within normal limits Gait & Station: normal Patient leans: N/A  Psychiatric Specialty Exam: ROS at this time denies chest pain, no shortness of breath at room air, denies any bleeding, denies any GI bleed    Blood pressure 110/67, pulse 87, temperature 97.8 F (36.6 C), temperature source Oral, resp. rate 12, height 6' (1.829 m), weight 330 lb (149.687 kg), SpO2 97 %.Body mass index is 44.75 kg/(m^2).  General Appearance: Fairly Groomed  Engineer, water::  Good  Speech:  Normal Rate409  Volume:  Normal  Mood:  reports mood as improved, feeling " better"  Affect:  more reactive, smiles at times appropriately   Thought Process:  Linear  Orientation:  Full (Time, Place, and Person)  Thought Content:  denies hallucinations, no delusions, not internally preoccupied  Suicidal Thoughts:  No- at this time denies any suicidal ideations, denies any self injurious ideations, denies any homicidal or violent ideations   Homicidal Thoughts:  No  Memory:  recent and remote grossly intact   Judgement:  Other:  improving   Insight:  improving   Psychomotor Activity:  Normal  Concentration:  Good  Recall:  Good  Fund of Knowledge:Good  Language: Good  Akathisia:  Negative  Handed:  Right   AIMS (if indicated):     Assets:  Desire for Improvement Resilience Social Support  Sleep:  Number of Hours: 6.75  Cognition: WNL  ADL's:  Intact   Mental Status Per Nursing Assessment::   On Admission:     Demographic Factors:  50 year old  Separated male, no children, on disability,  recently relocated to this area, living with brother   Loss Factors: History of chronic medical/cardiac illness , recent relocation   Historical Factors: History of depression, history of prior psychiatric admissions, history of suicide attempt as a teenager   Risk Reduction Factors:   Living with another person, especially a relative and Positive coping skills or problem solving skills  Continued Clinical Symptoms:  At this time patient reports feeling better than on admission- reports mood is improved and feels less depressed, at this time presents with improved range of affect, no thought disorder, no SI or HI, no psychotic symptoms, future oriented, plans to return to live with his brother, and plans to establish local psychiatric and cardiology outpatient care . At present denies any medication side effects  Cognitive Features That Contribute To Risk:  No gross cognitive deficits noted upon discharge. Is alert , attentive, and oriented x 3   Suicide Risk:  Mild:  Suicidal ideation of limited frequency, intensity, duration, and specificity.  There are no identifiable plans, no associated intent, mild dysphoria and related symptoms, good self-control (both objective and subjective assessment), few other risk factors, and identifiable protective factors, including available and accessible social support.  Follow-up Information    Follow up with [facility].  Specialty:  Behavioral Health   Why:  Please walk-in between 8am-3pm Monday-Friday to be evaluated for medication management and therapy. Please try to arrive early and be prepared for significant wait times. At this appt, recurring appts will  be scheduled   Contact information:   [place] [address] [phone]       Go to Candee Furbish, MD.   Specialty:  Cardiology   Why:  Appointment scheduled 4/19 at 10:30 AM   Contact information:   [id number] [place] [address] [phone]       Plan Of Care/Follow-up recommendations:  Activity:  as tolerated Diet:  heart healthy Tests:  NA Other:  See below  Patient is leaving unit in good spirits. Plans to return to his brother's home. Follow up as above  Neita Garnet, MD 06/02/2015, 11:48 AM

## 2015-06-02 NOTE — Progress Notes (Signed)
Recreation Therapy Notes  Date: 04.04.02017 Time: 9:30am Location: [address] Room   Group Topic: Stress Management  Goal Area(s) Addresses:  Patient will actively participate in stress management techniques presented during session.   Behavioral Response: Did not attend.   Laureen Ochs Kweli Grassel, LRT/CTRS        Kashten Gowin L 06/02/2015 2:15 PM

## 2015-06-16 ENCOUNTER — Ambulatory Visit (INDEPENDENT_AMBULATORY_CARE_PROVIDER_SITE_OTHER): Payer: Medicaid Other | Admitting: Cardiology

## 2015-06-16 ENCOUNTER — Encounter: Payer: Self-pay | Admitting: Cardiology

## 2015-06-16 VITALS — BP 128/88 | HR 68 | Ht 72.0 in | Wt 343.2 lb

## 2015-06-16 DIAGNOSIS — I481 Persistent atrial fibrillation: Secondary | ICD-10-CM

## 2015-06-16 DIAGNOSIS — Z79899 Other long term (current) drug therapy: Secondary | ICD-10-CM

## 2015-06-16 DIAGNOSIS — I428 Other cardiomyopathies: Secondary | ICD-10-CM

## 2015-06-16 DIAGNOSIS — I4819 Other persistent atrial fibrillation: Secondary | ICD-10-CM

## 2015-06-16 DIAGNOSIS — Z95810 Presence of automatic (implantable) cardiac defibrillator: Secondary | ICD-10-CM | POA: Diagnosis not present

## 2015-06-16 DIAGNOSIS — I5022 Chronic systolic (congestive) heart failure: Secondary | ICD-10-CM

## 2015-06-16 DIAGNOSIS — I429 Cardiomyopathy, unspecified: Secondary | ICD-10-CM | POA: Diagnosis not present

## 2015-06-16 NOTE — Progress Notes (Signed)
Cardiology Office Note    Date:  06/16/2015   ID:  Gary Kirk, DOB 11/27/1965, MRN [id number]  PCP:  No primary care provider on file.  Cardiologist:   Candee Furbish, MD    History of Present Illness:  Gary Kirk is a 50 y.o. male here for evaluation of atrial fibrillation, chronic systolic heart failure status post Bi-V ICD placement 9/4/ 2015 [facility] at the request of Dr Brandt Loosen. He has a history of major depressive disorder, recently hospitalized with psychiatry. Denies any shortness of breath, cough, chest pain, abdominal discomfort. Baseline creatinine has been between 1.4 and 1.6.  2010 discovery in [place] of cardiomyopathy.    Had a left heart catheterization on 12/03/13-nonischemic cardiomyopathy, no CAD. Has history of crack cocaine use no illicit substances since October 2015.  Most recent ejection fraction on nuclear stress test in August 2016 is 28% and most recent echocardiogram in October 2015 demonstrated EF of 15%. His primary cardiologist is at [facility] however he moved permanently to [place] and requested our group. He has a Medtronic ICD as well. He has been on amiodarone and Eliquis for anticoagulation. Has been described as having persistent atrial fibrillation however on most recent EKG on 05/29/15 he appears to have P waves preceding QRS complexes. EKG on 05/27/15 however appears to be more than atrial fibrillation like pattern. According to notes from [facility], he has had AV nodal ablation for atrial fibrillation.  He has been on amiodarone because he states that he had 10 prior discharges of his ICD shortly after implantation approximately one month after and has been on dose 400 mg once a day. VT.Last thyroid/TSH that I see in the system was June 2016 that was mildly elevated at 9.3. He also states that he has had some high difficulty because of the amiodarone. He understands however that he has been told he needs to stay on this medication.   Past Medical  History  Diagnosis Date  . Hypertension   . CHF (congestive heart failure) ([place])   . A-fib ([place])   . COPD (chronic obstructive pulmonary disease) [facility])     Past Surgical History  Procedure Laterality Date  . Cardiac defibrillator placement      Current Medications: Outpatient Prescriptions Prior to Visit  Medication Sig Dispense Refill  . amiodarone (PACERONE) 400 MG tablet Take 1 tablet (400 mg total) by mouth daily. 30 tablet 0  . apixaban (ELIQUIS) 5 MG TABS tablet Take 1 tablet (5 mg total) by mouth 2 (two) times daily. 60 tablet 0  . ARIPiprazole (ABILIFY) 5 MG tablet Take 1 tablet (5 mg total) by mouth daily. 30 tablet 0  . carvedilol (COREG) 25 MG tablet Take 1 tablet (25 mg total) by mouth 2 (two) times daily. 60 tablet 0  . FLUoxetine (PROZAC) 10 MG capsule Take 1 capsule (10 mg total) by mouth daily. 30 capsule 0  . furosemide (LASIX) 40 MG tablet Take 1 tablet (40 mg total) by mouth daily. 30 tablet 0  . levothyroxine (SYNTHROID, LEVOTHROID) 75 MCG tablet Take 1 tablet (75 mcg total) by mouth daily before breakfast. 30 tablet 0  . potassium chloride SA (K-DUR,KLOR-CON) 20 MEQ tablet Take 1 tablet (20 mEq total) by mouth daily. 30 tablet 0  . tiotropium (SPIRIVA HANDIHALER) 18 MCG inhalation capsule Place 1 capsule (18 mcg total) into inhaler and inhale daily as needed (for shortness of breath). 30 capsule 0  . traZODone (DESYREL) 100 MG tablet Take 1 tablet (100  mg total) by mouth at bedtime as needed for sleep. 30 tablet 0   No facility-administered medications prior to visit.     Allergies:   Tramadol and Motrin   Social History   Social History  . Marital Status: Single    Spouse Name: N/A  . Number of Children: N/A  . Years of Education: N/A   Social History Main Topics  . Smoking status: Former Smoker    Quit date: 04/29/2014  . Smokeless tobacco: None  . Alcohol Use: No  . Drug Use: No  . Sexual Activity: Not Asked   Other Topics Concern  . None    Social History Narrative     Family History:  Mother died 92 from dilated cardiomyopathy.   ROS:   Please see the history of present illness.    chronic back pain, no bleeding, no syncope,  ROS All other systems reviewed and are negative.   PHYSICAL EXAM:   VS:  BP 128/88 mmHg  Pulse 68  Ht 6' (1.829 m)  Wt 343 lb 3.2 oz (155.674 kg)  BMI 46.54 kg/m2   GEN: Well nourished, well developed, in no acute distress HEENT: normal Neck: no JVD, carotid bruits, or masses Cardiac: RRR; no murmurs, rubs, or gallops,no edema  Respiratory:  clear to auscultation bilaterally, normal work of breathing GI: soft, nontender, nondistended, + BS, obese MS: no deformity or atrophy Skin: warm and dry, no rash Neuro:  Alert and Oriented x 3, Strength and sensation are intact Psych: euthymic mood, full affect  Wt Readings from Last 3 Encounters:  06/16/15 343 lb 3.2 oz (155.674 kg)  05/24/15 330 lb (149.687 kg)  04/29/15 340 lb (154.223 kg)      Studies/Labs Reviewed:   EKG:  EKG is not ordered today.  Prior EKGs reviewed as above in history of present illness.   Recent Labs: 05/23/2015: ALT 32; Hemoglobin 15.4; Platelets 197 05/27/2015: Magnesium 1.7 05/30/2015: BUN 15; Creatinine, Ser 1.29*; Potassium 3.8; Sodium 140   Lipid Panel No results found for: CHOL, TRIG, HDL, CHOLHDL, VLDL, LDLCALC, LDLDIRECT  Additional studies/ records that were reviewed today include:  Prior hospital records, cardiac studies, lab work reviewed.   Nuclear stress test 10/12/14: Nonischemic cardiomyopathy, dilated left ventricle, EF 28%  Echocardiogram 12/02/13: Left Ventricle Left ventricular dilatation. Left ventricular wall thickness mildly increased. Global left ventricular hypokinesis. Severely reduced global left ventricular systolic function. Severely abnormal left ventricular ejection fraction estimated at 10-15%. Septal E/E' ratio is 14.1 indicating normal filling pressure. Right Ventricle  Catheter/pacemaker wire in the right ventricular cavity. Mild right ventricular dilatation. Reduced right ventricular global systolic function. Right Atrium Catheter/pacemaker wire in the right atrial cavity. Mild right atrial dilatation. Area 19cm2 Left Atrium Mild left atrial dilatation. Area 23cm2 Mitral Valve Normal function and mobility of the mitral valve leaflets. Trace mitral regurgitation. Aortic Valve Structurally normal trileaflet aortic valve. No aortic regurgitation. Tricuspid Valve Structurally normal tricuspid valve. Mild tricuspid regurgitation. Estimated PASP 66mmHg. Pulmonic Valve Structurally normal pulmonic valve. Pericardium Normal pericardium. No pericardial effusion. Vessels Normal size aortic root and proximal ascending aorta. Dilated IVC with decreased respiratory variation.     ASSESSMENT:    1. Non-ischemic cardiomyopathy ([place])   2. Long term current use of antiarrhythmic medical therapy   3. Persistent atrial fibrillation ([place])   4. ICD (implantable cardioverter-defibrillator) in place   5. Chronic systolic heart failure ([place])   6. Morbid obesity due to excess calories ([place])      PLAN:  In order of problems listed above:  Chronic systolic heart failure -Currently NYHA class II symptoms, driven also by morbid obesity. -Continue with carvedilol 25mg  twice a day -Currently not on ACE inhibitor, likely because of chronic kidney disease underlying. Creatinine has ranged from 1.3-1.5. We may wish to try low-dose ACE inhibitor in the future or Entresto. Not on spironolactone also because of chronic kidney disease. -Continue with Lasix 40 mg once a day. -Counseled him on salt restriction as well as fluid restriction. Daily weights.  Medtronic ICD -We will get him set up with the EP clinic -On amiodarone 400 mg a day. He notes that he has been told he has had some visual changes because of amiodarone. He understands in his words that he needs to take this  medicine however to help prevent further defibrillation firing.  History of ventricular tachycardia -Amiodarone. ICD.  Atrial fibrillation ?Persistent status post AV nodal ablation  -Continue with chronic anticoagulation, Eliquis, no changes made -Most recent EKG I do believe has P wave activity. He is on amiodarone because of prior ventricular tachycardia/ICD discharge per patient.  Chronic anticoagulation -Continue with Eliquis. No bleeding.  Morbid obesity -Encourage weight loss  Amiodarone use/high-risk medication -We will check TSH, free T4. Previously TSH was elevated at 9. In 2016, June.  Prior retinal stroke      Medication Adjustments/Labs and Tests Ordered: Current medicines are reviewed at length with the patient today.  Concerns regarding medicines are outlined above.  Medication changes, Labs and Tests ordered today are listed in the Patient Instructions below. Patient Instructions  Medication Instructions:  The current medical regimen is effective;  continue present plan and medications.  Labwork: Please have blood work today (TSH, Free T4).  Follow-Up: Follow up with Dr Marlou Porch in 4 months.  Please establish with one of the EP doctors to follow your defibrillator.  If you need a refill on your cardiac medications before your next appointment, please call your pharmacy.  Thank you for choosing [facility]!!         Signed, Candee Furbish, MD  06/16/2015 1:38 PM    [facility] [address], [place], [place]  [address] Phone: [phone]; Fax: [phone]

## 2015-06-16 NOTE — Patient Instructions (Addendum)
Medication Instructions:  The current medical regimen is effective;  continue present plan and medications.  Labwork: Please have blood work today (TSH, Free T4).  Follow-Up: Follow up with Dr Marlou Porch in 4 months.  Please establish with one of the EP doctors to follow your defibrillator.  If you need a refill on your cardiac medications before your next appointment, please call your pharmacy.  Thank you for choosing [facility]!!

## 2015-06-17 LAB — TSH: TSH: 3.58 m[IU]/L (ref 0.40–4.50)

## 2015-06-17 LAB — T4, FREE: FREE T4: 1.5 ng/dL (ref 0.8–1.8)

## 2015-06-21 NOTE — Progress Notes (Signed)
This encounter was created in error - please disregard.

## 2015-06-22 ENCOUNTER — Encounter: Payer: Self-pay | Admitting: Cardiology

## 2015-06-23 ENCOUNTER — Encounter: Payer: Self-pay | Admitting: Cardiology

## 2015-07-05 ENCOUNTER — Telehealth: Payer: Self-pay | Admitting: Cardiology

## 2015-07-05 NOTE — Telephone Encounter (Signed)
New message      Pt is interested in telemonitoring service-----daily wt., check vitals, etc daily.  Nurse is calling to get an order.  Please call

## 2015-07-05 NOTE — Telephone Encounter (Signed)
Dr Kingsley Plan is currently out of the office.  I will forward this message to Dr Kingsley Plan to review and give order if the pt can participate in telemonitoring service. This will allow the pt to do things like daily weight and check vitals.

## 2015-07-12 NOTE — Telephone Encounter (Signed)
I am fine with him per participating in telemonitoring service. Please check with our EP clinic first however as I would rather him have this service run through our clinic.  Candee Furbish, MD

## 2015-07-12 NOTE — Telephone Encounter (Signed)
Pt has never established care with EP here.  Spoke with Nira Conn about telemonitoring service thru them however pt is reporting he lives with his brother who is getting evicted later this month and he is unsure of where he is going to be living.  They will continue to be in contact with the pt.

## 2015-10-18 ENCOUNTER — Ambulatory Visit: Payer: Self-pay | Admitting: Cardiology

## 2015-10-18 NOTE — Progress Notes (Deleted)
Cardiology Office Note    Date:  10/18/2015   ID:  Gary Kirk, DOB [birth year], MRN [id number]  PCP:  No primary care provider on file.  Cardiologist:   Candee Furbish, MD    History of Present Illness:  Gary Kirk is a 50 y.o. male here for follow up of atrial fibrillation, chronic systolic heart failure status post Bi-V ICD placement 9/4/ 2015 [facility] at the request of Dr Brandt Loosen. He has a history of major depressive disorder, recently hospitalized with psychiatry. Denies any shortness of breath, cough, chest pain, abdominal discomfort. Baseline creatinine has been between 1.4 and 1.6.  2010 discovery in [place] of cardiomyopathy.    Had a left heart catheterization on 12/03/13-nonischemic cardiomyopathy, no CAD. Has history of crack cocaine use no illicit substances since October 2015.  Most recent ejection fraction on nuclear stress test in August 2016 is 28% and most recent echocardiogram in October 2015 demonstrated EF of 15%. His primary cardiologist is at [facility] however he moved permanently to [place] and requested our group. He has a Medtronic ICD as well. He has been on amiodarone and Eliquis for anticoagulation. Has been described as having persistent atrial fibrillation however on most recent EKG on 05/29/15 he appears to have P waves preceding QRS complexes. EKG on 05/27/15 however appears to be more than atrial fibrillation like pattern. According to notes from [facility], he has had AV nodal ablation for atrial fibrillation.  He has been on amiodarone because he states that he had 10 prior discharges of his ICD shortly after implantation approximately one month after and has been on dose 400 mg once a day. VT.Last thyroid/TSH that I see in the system was June 2016 that was mildly elevated at 9.3. He also states that he has had some high difficulty because of the amiodarone. He understands however that he has been told he needs to stay on this medication.   Past Medical  History:  Diagnosis Date  . A-fib ([place])   . CHF (congestive heart failure) ([place])   . COPD (chronic obstructive pulmonary disease) ([place])   . Hypertension     Past Surgical History:  Procedure Laterality Date  . CARDIAC DEFIBRILLATOR PLACEMENT      Current Medications: Outpatient Medications Prior to Visit  Medication Sig Dispense Refill  . amiodarone (PACERONE) 400 MG tablet Take 1 tablet (400 mg total) by mouth daily. 30 tablet 0  . apixaban (ELIQUIS) 5 MG TABS tablet Take 1 tablet (5 mg total) by mouth 2 (two) times daily. 60 tablet 0  . ARIPiprazole (ABILIFY) 5 MG tablet Take 1 tablet (5 mg total) by mouth daily. 30 tablet 0  . carvedilol (COREG) 25 MG tablet Take 1 tablet (25 mg total) by mouth 2 (two) times daily. 60 tablet 0  . FLUoxetine (PROZAC) 10 MG capsule Take 1 capsule (10 mg total) by mouth daily. 30 capsule 0  . furosemide (LASIX) 40 MG tablet Take 1 tablet (40 mg total) by mouth daily. 30 tablet 0  . HYDROcodone-acetaminophen (NORCO/VICODIN) 5-325 MG tablet Take 1 tablet by mouth every 8 (eight) hours as needed for moderate pain or severe pain (Patient takes this med 3 times a day). for pain  0  . levothyroxine (SYNTHROID, LEVOTHROID) 75 MCG tablet Take 1 tablet (75 mcg total) by mouth daily before breakfast. 30 tablet 0  . NITROSTAT 0.4 MG SL tablet Place 1 tablet (0.4 mg total) under the tongue every five (5) minutes as needed for chest  pain.  2  . potassium chloride SA (K-DUR,KLOR-CON) 20 MEQ tablet Take 1 tablet (20 mEq total) by mouth daily. 30 tablet 0  . tiotropium (SPIRIVA HANDIHALER) 18 MCG inhalation capsule Place 1 capsule (18 mcg total) into inhaler and inhale daily as needed (for shortness of breath). 30 capsule 0  . traZODone (DESYREL) 100 MG tablet Take 1 tablet (100 mg total) by mouth at bedtime as needed for sleep. 30 tablet 0   No facility-administered medications prior to visit.      Allergies:   Tramadol and Motrin [ibuprofen]   Social History    Social History  . Marital status: Single    Spouse name: N/A  . Number of children: N/A  . Years of education: N/A   Social History Main Topics  . Smoking status: Former Smoker    Quit date: 04/29/2014  . Smokeless tobacco: Not on file  . Alcohol use No  . Drug use: No  . Sexual activity: Not on file   Other Topics Concern  . Not on file   Social History Narrative  . No narrative on file     Family History:  Mother died 62 from dilated cardiomyopathy.   ROS:   Please see the history of present illness.    chronic back pain, no bleeding, no syncope,  ROS All other systems reviewed and are negative.   PHYSICAL EXAM:   VS:  There were no vitals taken for this visit.   GEN: Well nourished, well developed, in no acute distress HEENT: normal Neck: no JVD, carotid bruits, or masses Cardiac: RRR; no murmurs, rubs, or gallops,no edema  Respiratory:  clear to auscultation bilaterally, normal work of breathing GI: soft, nontender, nondistended, + BS, obese MS: no deformity or atrophy Skin: warm and dry, no rash Neuro:  Alert and Oriented x 3, Strength and sensation are intact Psych: euthymic mood, full affect  Wt Readings from Last 3 Encounters:  06/16/15 (!) 343 lb 3.2 oz (155.7 kg)  04/29/15 (!) 340 lb (154.2 kg)      Studies/Labs Reviewed:   EKG:  EKG is not ordered today.  Prior EKGs reviewed as above in history of present illness.   Recent Labs: 05/23/2015: ALT 32; Hemoglobin 15.4; Platelets 197 05/27/2015: Magnesium 1.7 05/30/2015: BUN 15; Creatinine, Ser 1.29; Potassium 3.8; Sodium 140 06/16/2015: TSH 3.58   Lipid Panel No results found for: CHOL, TRIG, HDL, CHOLHDL, VLDL, LDLCALC, LDLDIRECT  Additional studies/ records that were reviewed today include:  Prior hospital records, cardiac studies, lab work reviewed.   Nuclear stress test 10/12/14: Nonischemic cardiomyopathy, dilated left ventricle, EF 28%  Echocardiogram 12/02/13: Left Ventricle Left ventricular  dilatation. Left ventricular wall thickness mildly increased. Global left ventricular hypokinesis. Severely reduced global left ventricular systolic function. Severely abnormal left ventricular ejection fraction estimated at 10-15%. Septal E/E' ratio is 14.1 indicating normal filling pressure. Right Ventricle Catheter/pacemaker wire in the right ventricular cavity. Mild right ventricular dilatation. Reduced right ventricular global systolic function. Right Atrium Catheter/pacemaker wire in the right atrial cavity. Mild right atrial dilatation. Area 19cm2 Left Atrium Mild left atrial dilatation. Area 23cm2 Mitral Valve Normal function and mobility of the mitral valve leaflets. Trace mitral regurgitation. Aortic Valve Structurally normal trileaflet aortic valve. No aortic regurgitation. Tricuspid Valve Structurally normal tricuspid valve. Mild tricuspid regurgitation. Estimated PASP 75mmHg. Pulmonic Valve Structurally normal pulmonic valve. Pericardium Normal pericardium. No pericardial effusion. Vessels Normal size aortic root and proximal ascending aorta. Dilated IVC with decreased respiratory variation.  ASSESSMENT:    1. Chronic systolic heart failure ([place])   2. ICD (implantable cardioverter-defibrillator) in place   3. Persistent atrial fibrillation ([facility])      PLAN:  In order of problems listed above:  Chronic systolic heart failure -Currently NYHA class II symptoms, driven also by morbid obesity. -Continue with carvedilol 25mg  twice a day -Currently not on ACE inhibitor, likely because of chronic kidney disease underlying. Creatinine has ranged from 1.3-1.5. We may wish to try low-dose ACE inhibitor in the future or Entresto. Not on spironolactone also because of chronic kidney disease. -Continue with Lasix 40 mg once a day. -Counseled him on salt restriction as well as fluid restriction. Daily weights.  Medtronic ICD -We will get him set up with the EP clinic -On  amiodarone 400 mg a day. He notes that he has been told he has had some visual changes because of amiodarone. He understands in his words that he needs to take this medicine however to help prevent further defibrillation firing.  History of ventricular tachycardia -Amiodarone. ICD.  Atrial fibrillation ?Persistent status post AV nodal ablation  -Continue with chronic anticoagulation, Eliquis, no changes made -Most recent EKG I do believe has P wave activity. He is on amiodarone because of prior ventricular tachycardia/ICD discharge per patient.  Chronic anticoagulation -Continue with Eliquis. No bleeding.  Morbid obesity -Encourage weight loss  Amiodarone use/high-risk medication -We will check TSH, free T4. Previously TSH was elevated at 9. In 2016, June.  Prior retinal stroke      Medication Adjustments/Labs and Tests Ordered: Current medicines are reviewed at length with the patient today.  Concerns regarding medicines are outlined above.  Medication changes, Labs and Tests ordered today are listed in the Patient Instructions below. There are no Patient Instructions on file for this visit.   Signed, Candee Furbish, MD  10/18/2015 7:57 AM    [facility] [place], [place], [place]  [address] Phone: [phone]; Fax: [phone]

## 2015-10-20 ENCOUNTER — Encounter: Payer: Self-pay | Admitting: Cardiology

## 2016-05-04 ENCOUNTER — Emergency Department (HOSPITAL_COMMUNITY): Payer: Medicaid Other

## 2016-05-04 ENCOUNTER — Emergency Department (HOSPITAL_COMMUNITY)
Admission: EM | Admit: 2016-05-04 | Discharge: 2016-05-04 | Disposition: A | Discharge status: Home / Self Care | Payer: Medicaid Other | Attending: Emergency Medicine | Admitting: Emergency Medicine

## 2016-05-04 ENCOUNTER — Encounter (HOSPITAL_COMMUNITY): Payer: Self-pay | Admitting: Cardiology

## 2016-05-04 DIAGNOSIS — J449 Chronic obstructive pulmonary disease, unspecified: Secondary | ICD-10-CM | POA: Diagnosis not present

## 2016-05-04 DIAGNOSIS — I110 Hypertensive heart disease with heart failure: Secondary | ICD-10-CM | POA: Diagnosis not present

## 2016-05-04 DIAGNOSIS — I509 Heart failure, unspecified: Secondary | ICD-10-CM | POA: Insufficient documentation

## 2016-05-04 DIAGNOSIS — R197 Diarrhea, unspecified: Secondary | ICD-10-CM | POA: Diagnosis not present

## 2016-05-04 DIAGNOSIS — R112 Nausea with vomiting, unspecified: Secondary | ICD-10-CM | POA: Diagnosis not present

## 2016-05-04 DIAGNOSIS — R1084 Generalized abdominal pain: Secondary | ICD-10-CM | POA: Diagnosis not present

## 2016-05-04 DIAGNOSIS — Z79899 Other long term (current) drug therapy: Secondary | ICD-10-CM | POA: Diagnosis not present

## 2016-05-04 DIAGNOSIS — J189 Pneumonia, unspecified organism: Secondary | ICD-10-CM | POA: Diagnosis not present

## 2016-05-04 DIAGNOSIS — R0602 Shortness of breath: Secondary | ICD-10-CM | POA: Diagnosis present

## 2016-05-04 DIAGNOSIS — Z87891 Personal history of nicotine dependence: Secondary | ICD-10-CM | POA: Diagnosis not present

## 2016-05-04 LAB — CBC WITH DIFFERENTIAL/PLATELET
Basophils Absolute: 0 10*3/uL (ref 0.0–0.1)
Basophils Relative: 0 %
Eosinophils Absolute: 0.1 10*3/uL (ref 0.0–0.7)
Eosinophils Relative: 1 %
HCT: 42.7 % (ref 39.0–52.0)
Hemoglobin: 14.8 g/dL (ref 13.0–17.0)
Lymphocytes Relative: 11 %
Lymphs Abs: 0.9 10*3/uL (ref 0.7–4.0)
MCH: 31.9 pg (ref 26.0–34.0)
MCHC: 34.7 g/dL (ref 30.0–36.0)
MCV: 92 fL (ref 78.0–100.0)
Monocytes Absolute: 1.2 10*3/uL — ABNORMAL HIGH (ref 0.1–1.0)
Monocytes Relative: 15 %
Neutro Abs: 5.8 10*3/uL (ref 1.7–7.7)
Neutrophils Relative %: 73 %
Platelets: 150 10*3/uL (ref 150–400)
RBC: 4.64 MIL/uL (ref 4.22–5.81)
RDW: 12.6 % (ref 11.5–15.5)
WBC: 8 10*3/uL (ref 4.0–10.5)

## 2016-05-04 LAB — LIPASE, BLOOD: Lipase: 19 U/L (ref 11–51)

## 2016-05-04 LAB — I-STAT TROPONIN, ED: Troponin i, poc: 0.02 ng/mL (ref 0.00–0.08)

## 2016-05-04 LAB — COMPREHENSIVE METABOLIC PANEL
ALT: 60 U/L (ref 17–63)
AST: 78 U/L — ABNORMAL HIGH (ref 15–41)
Albumin: 3.6 g/dL (ref 3.5–5.0)
Alkaline Phosphatase: 52 U/L (ref 38–126)
Anion gap: 10 (ref 5–15)
BUN: 13 mg/dL (ref 6–20)
CO2: 26 mmol/L (ref 22–32)
Calcium: 9.3 mg/dL (ref 8.9–10.3)
Chloride: 100 mmol/L — ABNORMAL LOW (ref 101–111)
Creatinine, Ser: 1.1 mg/dL (ref 0.61–1.24)
GFR calc Af Amer: >60 mL/min (ref >60)
GFR calc non Af Amer: >60 mL/min (ref >60)
Glucose, Bld: 112 mg/dL — ABNORMAL HIGH (ref 65–99)
Potassium: 3.5 mmol/L (ref 3.5–5.1)
Sodium: 136 mmol/L (ref 135–145)
Total Bilirubin: 1.4 mg/dL — ABNORMAL HIGH (ref 0.3–1.2)
Total Protein: 7.5 g/dL (ref 6.5–8.1)

## 2016-05-04 LAB — URINALYSIS, ROUTINE W REFLEX MICROSCOPIC
Bilirubin Urine: NEGATIVE
Glucose, UA: NEGATIVE mg/dL
Ketones, ur: 20 mg/dL — AB
Leukocytes, UA: NEGATIVE
Nitrite: NEGATIVE
Protein, ur: 30 mg/dL — AB
RBC / HPF: NONE SEEN RBC/hpf (ref 0–5)
Specific Gravity, Urine: >1.046 — ABNORMAL HIGH (ref 1.005–1.030)
pH: 5 (ref 5.0–8.0)

## 2016-05-04 LAB — I-STAT CG4 LACTIC ACID, ED: Lactic Acid, Venous: 1.13 mmol/L (ref 0.5–1.9)

## 2016-05-04 MED ORDER — ONDANSETRON HCL 4 MG/2ML IJ SOLN
4.0000 mg | Freq: Once | INTRAMUSCULAR | Status: AC
  Administered 2016-05-04: 4 mg via INTRAVENOUS
  Filled 2016-05-04: qty 2

## 2016-05-04 MED ORDER — AZITHROMYCIN 250 MG PO TABS: 250.0000 mg | ORAL_TABLET | Freq: Every day | ORAL | 0 refills | Status: DC

## 2016-05-04 MED ORDER — IOPAMIDOL (ISOVUE-300) INJECTION 61%
125.0000 mL | Freq: Once | INTRAVENOUS | Status: AC | PRN
  Administered 2016-05-04: 125 mL via INTRAVENOUS

## 2016-05-04 MED ORDER — SODIUM CHLORIDE 0.9 % IV BOLUS (SEPSIS)
1000.0000 mL | Freq: Once | INTRAVENOUS | Status: AC
  Administered 2016-05-04: 1000 mL via INTRAVENOUS

## 2016-05-04 MED ORDER — MORPHINE SULFATE (PF) 4 MG/ML IV SOLN
4.0000 mg | Freq: Once | INTRAVENOUS | Status: AC
  Administered 2016-05-04: 4 mg via INTRAVENOUS
  Filled 2016-05-04: qty 1

## 2016-05-04 MED ORDER — DEXTROSE 5 % IV SOLN
1.0000 g | Freq: Once | INTRAVENOUS | Status: AC
  Administered 2016-05-04: 1 g via INTRAVENOUS
  Filled 2016-05-04: qty 10

## 2016-05-04 MED ORDER — AZITHROMYCIN 500 MG IV SOLR
500.0000 mg | Freq: Once | INTRAVENOUS | Status: AC
  Administered 2016-05-04: 500 mg via INTRAVENOUS
  Filled 2016-05-04: qty 500

## 2016-05-04 NOTE — ED Provider Notes (Signed)
Medical screening examination/treatment/procedure(s) were conducted as a shared visit with non-physician practitioner(s) and myself.  I personally evaluated the patient during the encounter.   EKG Interpretation  Date/Time:  Thursday May 04 2016 09:06:02 EST Ventricular Rate:  113 PR Interval:    QRS Duration: 144 QT Interval:  394 QTC Calculation: 541 R Axis:   -72 Text Interpretation:  Afib/flut and V-paced complexes No further analysis attempted due to paced rhythm Confirmed by Curley Hogen  MD, Naheem Mosco [id number]) on 05/04/2016 9:10:35 AM       Results for orders placed or performed during the hospital encounter of 05/04/16  Blood culture (routine x 2)  Result Value Ref Range   Specimen Description BLOOD LEFT HAND    Special Requests BOTTLES DRAWN AEROBIC AND ANAEROBIC 4CC EACH    Culture PENDING    Report Status PENDING   Blood culture (routine x 2)  Result Value Ref Range   Specimen Description BLOOD LEFT FOREARM    Special Requests BOTTLES DRAWN AEROBIC AND ANAEROBIC 4CC EACH    Culture PENDING    Report Status PENDING   Comprehensive metabolic panel  Result Value Ref Range   Sodium 136 135 - 145 mmol/L   Potassium 3.5 3.5 - 5.1 mmol/L   Chloride 100 (L) 101 - 111 mmol/L   CO2 26 22 - 32 mmol/L   Glucose, Bld 112 (H) 65 - 99 mg/dL   BUN 13 6 - 20 mg/dL   Creatinine, Ser 1.10 0.61 - 1.24 mg/dL   Calcium 9.3 8.9 - 10.3 mg/dL   Total Protein 7.5 6.5 - 8.1 g/dL   Albumin 3.6 3.5 - 5.0 g/dL   AST 78 (H) 15 - 41 U/L   ALT 60 17 - 63 U/L   Alkaline Phosphatase 52 38 - 126 U/L   Total Bilirubin 1.4 (H) 0.3 - 1.2 mg/dL   GFR calc non Af Amer >60 >60 mL/min   GFR calc Af Amer >60 >60 mL/min   Anion gap 10 5 - 15  CBC with Differential  Result Value Ref Range   WBC 8.0 4.0 - 10.5 K/uL   RBC 4.64 4.22 - 5.81 MIL/uL   Hemoglobin 14.8 13.0 - 17.0 g/dL   HCT 42.7 39.0 - 52.0 %   MCV 92.0 78.0 - 100.0 fL   MCH 31.9 26.0 - 34.0 pg   MCHC 34.7 30.0 - 36.0 g/dL   RDW 12.6 11.5 -  15.5 %   Platelets 150 150 - 400 K/uL   Neutrophils Relative % 73 %   Neutro Abs 5.8 1.7 - 7.7 K/uL   Lymphocytes Relative 11 %   Lymphs Abs 0.9 0.7 - 4.0 K/uL   Monocytes Relative 15 %   Monocytes Absolute 1.2 (H) 0.1 - 1.0 K/uL   Eosinophils Relative 1 %   Eosinophils Absolute 0.1 0.0 - 0.7 K/uL   Basophils Relative 0 %   Basophils Absolute 0.0 0.0 - 0.1 K/uL  Lipase, blood  Result Value Ref Range   Lipase 19 11 - 51 U/L  I-stat troponin, ED  Result Value Ref Range   Troponin i, poc 0.02 0.00 - 0.08 ng/mL   Comment 3          I-Stat CG4 Lactic Acid, ED  Result Value Ref Range   Lactic Acid, Venous 1.13 0.5 - 1.9 mmol/L   Dg Chest 2 View  Result Date: 05/04/2016 CLINICAL DATA:  Increasing shortness of breath, vomiting for 4 days EXAM: CHEST  2 VIEW COMPARISON:  04/29/2015 FINDINGS: Left AICD remains in place, unchanged. Heart is borderline in size. Lungs are clear. No effusions or acute bony abnormality. IMPRESSION: Borderline heart size.  No active disease. Electronically Signed   By: Rolm Baptise M.D.   On: 05/04/2016 11:04   Ct Chest Wo Contrast  Result Date: 05/04/2016 CLINICAL DATA:  Shortness of breath and cough with question pneumonia seen on abdominal CT earlier today. EXAM: CT CHEST WITHOUT CONTRAST TECHNIQUE: Multidetector CT imaging of the chest was performed following the standard protocol without IV contrast. COMPARISON:  Chest x-ray and abdomen CT earlier today. FINDINGS: Cardiovascular: Heart size normal. No pericardial effusion. Coronary artery calcification is noted. Left permanent pacemaker noted. Mediastinum/Nodes: Scattered mediastinal lymph nodes evident without lymphadenopathy. No evidence for gross hilar lymphadenopathy although assessment is limited by the lack of intravenous contrast on today's study. The esophagus has normal imaging features. There is no axillary lymphadenopathy. Lungs/Pleura: Scattered areas of ground-glass attenuation are identified in the  lungs bilaterally, right greater than left. Peripheral predominance noted. These changes are associated with diffuse bronchial wall thickening bilaterally. No dense focal airspace consolidation. No pulmonary edema or pleural effusion. Upper Abdomen: Unremarkable. Musculoskeletal: Bone windows reveal no worrisome lytic or sclerotic osseous lesions. IMPRESSION: 1. Scattered areas of ground-glass attenuation with a right-sided and peripheral predominance. Features are associated with diffuse bronchial wall thickening. Multifocal bronchopneumonia or atypical infection favored. Cryptogenic pneumonia also a consideration although findings are more asymmetric than typically seen for that etiology. Septic emboli also considered much less likely given the asymmetry. Electronically Signed   By: Misty Stanley M.D.   On: 05/04/2016 13:12   Ct Abdomen Pelvis W Contrast  Result Date: 05/04/2016 CLINICAL DATA:  Nausea, vomiting, diarrhea for 4 days, constipation for 2 weeks, right lower quadrant pain EXAM: CT ABDOMEN AND PELVIS WITH CONTRAST TECHNIQUE: Multidetector CT imaging of the abdomen and pelvis was performed using the standard protocol following bolus administration of intravenous contrast. CONTRAST:  143mL ISOVUE-300 IOPAMIDOL (ISOVUE-300) INJECTION 61% COMPARISON:  None. FINDINGS: Lower chest: Lung bases shows small patchy infiltrate in right middle lobe suspicious for infection/pneumonitis. Partially visualized cardiac pacemaker leads. Hepatobiliary: Enhanced liver shows no focal abnormality. No calcified gallstones are noted within gallbladder. Pancreas: The enhanced pancreas shows moderate fatty replacement especially in the in the head and body of the pancreas. No focal pancreatic mass. No pancreatic ductal dilatation. No definite evidence of acute pancreatitis or peripancreatic fluid. Spleen: Enhanced spleen with normal appearance. Adrenals/Urinary Tract: No adrenal gland mass. Enhanced kidneys are symmetrical in  size. No hydronephrosis or hydroureter. No nephrolithiasis. Delayed renal images shows bilateral renal symmetrical excretion. Bilateral visualized proximal ureter is unremarkable. Stomach/Bowel: No small bowel obstruction. No thickened or dilated small bowel loops. The terminal ileum is unremarkable. No pericecal inflammation. Normal appendix is clearly identified retrocecal position axial image 64. Limited assessment of the colon which is empty collapsed. Only small amount of gas noted in mid sigmoid colon. No evidence of colitis or diverticulitis. Vascular/Lymphatic: Atherosclerotic calcifications of abdominal aorta and iliac arteries. No aortic aneurysm. No adenopathy. Reproductive: Prostate gland and seminal vesicles are unremarkable. The urinary bladder is unremarkable. Other: No inguinal adenopathy. Small nonspecific bilateral inguinal lymph nodes are noted not pathologic by size criteria. There is no ascites or free abdominal air. Musculoskeletal: No destructive bony lesions are noted. Mild degenerative changes lower thoracic spine. Mild degenerative changes bilateral SI joints. IMPRESSION: 1. There is partially visualized patchy infiltrate in right middle lobe anterior and laterally. This is highly suspicious  for atypical infection/ pneumonitis. Clinical correlation is necessary. Follow-up to resolution is recommended. 2. There is moderate fatty replacement of the pancreas especially in the region of body and head of the pancreas. No definite focal pancreatic mass. No pancreatic ductal dilatation. No definite evidence of acute pancreatitis. 3. No calcified gallstones are noted within gallbladder. No nephrolithiasis. No hydronephrosis or hydroureter. 4. Normal appendix. No pericecal inflammation. No small bowel obstruction. No evidence of colitis or diverticulitis. Limited assessment of the colon which is empty partially collapsed. 5. No ascites or free abdominal air. Electronically Signed   By: Lahoma Crocker  M.D.   On: 05/04/2016 11:24    Patient seen by me along with the physician assistant. Patient presented with the complaint of abdominal pain and nausea. Also had some loose bowel movements. CT scan of the abdomen was negative belly pain is now resolved. However CT scan of the abdomen raise some concern for right-sided pneumonia which led to a CT of the chest. Chest x-ray had or even done and was negative for any acute findings. CT of the chest raises concerns for pneumonia on the right side and perhaps an atypical pneumonia. Patient has not been hospitalized in the last 90 days. Patient does have in any known history of immunocompromised illness. Will treat as a community-acquired pneumonia with Rocephin and Zithromax and then continue Zithromax at home. Patient was slightly tachycardic upon arrival. Important to verify that his oxygen saturations which were 95% earlier today are still normal. Patient will return for any new or worse symptoms.  On exam patient is alert and oriented. Lungs are clear abdomen soft and nontender at this time.   Fredia Sorrow, MD 05/04/16 1343

## 2016-05-04 NOTE — ED Notes (Signed)
Pt ambulated well. O2 remained at 96%

## 2016-05-04 NOTE — ED Notes (Signed)
Patient unable to handle oral contrast. EDPA and CT made aware.

## 2016-05-04 NOTE — Discharge Instructions (Signed)
Medications: Azithromycin  Treatment: Take azithromycin as prescribed. Make sure to finish all this medication. Make sure to drink plenty of water. You can continue taking laxative as prescribed over-the-counter as needed to make a bowel movement.  Follow-up: Please follow-up with your primary care provider in 2-3 days for recheck of symptoms. Please return to emergency department if you develop any new or worsening symptoms.

## 2016-05-04 NOTE — ED Notes (Signed)
Patient c/o abd pain and nausea. Per patient has not had normal BM x2 weeks and is now nauseated and short of breath. Per patient used laxative with small amount of watery stool but no solid BM. Patient states normaly regular. Small amount of bright red rectal bleeding with straining reported.

## 2016-05-04 NOTE — ED Provider Notes (Signed)
[facility] DEPT Provider Note   CSN: [id number] Arrival date & time: 05/04/16  0902     History   Chief Complaint Chief Complaint  Patient presents with  . Abdominal Pain    HPI Gary Kirk is a 51 y.o. male with history of atrial fibrillation, CHF, hypertension, COPD who presents with a four-day history abdominal pain, nausea, vomiting, diarrhea. Patient states he began around 2-3 weeks ago with constipation, but developed the above symptoms associated with cold sweats 4 days ago. Patient has not been able to keep down fluid or food for 4 days. Patient reports generalized abdominal pain, worst in the right lower quadrant. Patient took Dulcolax to 2 weeks ago for constipation with no significant relief. Patient denies any fevers, but does report cold sweats. He denies any urinary symptoms. Patient states he has had some shortness of breath with the symptoms, but no chest pain. She has not taken any other medications for his symptoms. He states he has not been taking his fluid pills, but denies any new leg pain or swelling. Patient denies any previous abdominal surgeries.  HPI  Past Medical History:  Diagnosis Date  . A-fib ([place])   . CHF (congestive heart failure) ([place])   . COPD (chronic obstructive pulmonary disease) ([place])   . Hypertension     Patient Active Problem List   Diagnosis Date Noted  . Non-ischemic cardiomyopathy ([place])   . Nonspecific abnormal electrocardiogram (ECG) (EKG)   . Creatinine elevation   . Severe episode of recurrent major depressive disorder, without psychotic features ([place])   . Severe recurrent major depression without psychotic features ([place]) 05/24/2015  . MDD (major depressive disorder) 05/24/2015  . MDD (major depressive disorder), recurrent episode, severe ([place]) 05/24/2015    Past Surgical History:  Procedure Laterality Date  . CARDIAC DEFIBRILLATOR PLACEMENT         Home Medications    Prior to Admission medications   Medication  Sig Start Date End Date Taking? Authorizing Provider  albuterol (PROVENTIL HFA;VENTOLIN HFA) 108 (90 Base) MCG/ACT inhaler Inhale 2 puffs into the lungs every 6 (six) hours as needed for wheezing or shortness of breath.   Yes Historical Provider, MD  amiodarone (PACERONE) 400 MG tablet Take 1 tablet (400 mg total) by mouth daily. 06/02/15  Yes Kerrie Buffalo, NP  apixaban (ELIQUIS) 5 MG TABS tablet Take 1 tablet (5 mg total) by mouth 2 (two) times daily. 06/02/15  Yes Kerrie Buffalo, NP  levothyroxine (SYNTHROID, LEVOTHROID) 75 MCG tablet Take 1 tablet (75 mcg total) by mouth daily before breakfast. 06/02/15  Yes Kerrie Buffalo, NP  ARIPiprazole (ABILIFY) 5 MG tablet Take 1 tablet (5 mg total) by mouth daily. Patient not taking: Reported on 05/04/2016 06/02/15   Kerrie Buffalo, NP  azithromycin (ZITHROMAX) 250 MG tablet Take 1 tablet (250 mg total) by mouth daily. Take first 2 tablets together, then 1 every day until finished. 05/04/16   Frederica Kuster, PA-C  carvedilol (COREG) 25 MG tablet Take 1 tablet (25 mg total) by mouth 2 (two) times daily. Patient not taking: Reported on 05/04/2016 06/02/15   Kerrie Buffalo, NP  FLUoxetine (PROZAC) 10 MG capsule Take 1 capsule (10 mg total) by mouth daily. Patient not taking: Reported on 05/04/2016 06/02/15   Kerrie Buffalo, NP  furosemide (LASIX) 40 MG tablet Take 1 tablet (40 mg total) by mouth daily. Patient not taking: Reported on 05/04/2016 06/02/15   Kerrie Buffalo, NP  NITROSTAT 0.4 MG SL tablet Place 1 tablet (0.4  mg total) under the tongue every five (5) minutes as needed for chest pain. 03/12/15   Historical Provider, MD  potassium chloride SA (K-DUR,KLOR-CON) 20 MEQ tablet Take 1 tablet (20 mEq total) by mouth daily. Patient not taking: Reported on 05/04/2016 06/02/15   Kerrie Buffalo, NP  tiotropium (SPIRIVA HANDIHALER) 18 MCG inhalation capsule Place 1 capsule (18 mcg total) into inhaler and inhale daily as needed (for shortness of breath). Patient not taking: Reported on  05/04/2016 06/02/15   Kerrie Buffalo, NP  traZODone (DESYREL) 100 MG tablet Take 1 tablet (100 mg total) by mouth at bedtime as needed for sleep. Patient not taking: Reported on 05/04/2016 06/02/15   Kerrie Buffalo, NP    Family History History reviewed. No pertinent family history.  Social History Social History  Substance Use Topics  . Smoking status: Former Smoker    Quit date: 04/29/2014  . Smokeless tobacco: Not on file  . Alcohol use No     Allergies   Tramadol and Motrin [ibuprofen]   Review of Systems Review of Systems  Constitutional: Positive for appetite change and chills. Negative for fever.  HENT: Negative for facial swelling and sore throat.   Respiratory: Positive for shortness of breath.   Cardiovascular: Negative for chest pain.  Gastrointestinal: Positive for abdominal pain, diarrhea, nausea and vomiting.  Genitourinary: Negative for dysuria.  Musculoskeletal: Positive for back pain (intermittent).  Skin: Negative for rash and wound.  Neurological: Negative for headaches.  Psychiatric/Behavioral: The patient is not nervous/anxious.      Physical Exam Updated Vital Signs BP 122/77   Pulse 92   Temp 97.6 F (36.4 C)   Resp 14   Ht 6\' 1"  (1.854 m)   Wt (!) 145.2 kg   SpO2 95%   BMI 42.22 kg/m   Physical Exam  Constitutional: He appears well-developed and well-nourished. No distress.  HENT:  Head: Normocephalic and atraumatic.  Mouth/Throat: Oropharynx is clear and moist. No oropharyngeal exudate.  Eyes: Conjunctivae are normal. Pupils are equal, round, and reactive to light. Right eye exhibits no discharge. Left eye exhibits no discharge. No scleral icterus.  Neck: Normal range of motion. Neck supple. No thyromegaly present.  Cardiovascular: Regular rhythm, normal heart sounds and intact distal pulses.  Tachycardia present.  Exam reveals no gallop and no friction rub.   No murmur heard. Pulmonary/Chest: Effort normal and breath sounds normal. No  stridor. No respiratory distress. He has no wheezes. He has no rales.  Abdominal: Soft. Bowel sounds are normal. He exhibits no distension. There is tenderness in the right lower quadrant. There is tenderness at McBurney's point. There is no rebound and no guarding.  Musculoskeletal: He exhibits no edema.  Lymphadenopathy:    He has no cervical adenopathy.  Neurological: He is alert. Coordination normal.  Skin: Skin is warm. No rash noted. He is diaphoretic. No pallor.  Psychiatric: He has a normal mood and affect.  Nursing note and vitals reviewed.    ED Treatments / Results  Labs (all labs ordered are listed, but only abnormal results are displayed) Labs Reviewed  COMPREHENSIVE METABOLIC PANEL - Abnormal; Notable for the following:       Result Value   Chloride 100 (*)    Glucose, Bld 112 (*)    AST 78 (*)    Total Bilirubin 1.4 (*)    All other components within normal limits  CBC WITH DIFFERENTIAL/PLATELET - Abnormal; Notable for the following:    Monocytes Absolute 1.2 (*)  All other components within normal limits  URINALYSIS, ROUTINE W REFLEX MICROSCOPIC - Abnormal; Notable for the following:    Specific Gravity, Urine >1.046 (*)    Hgb urine dipstick SMALL (*)    Ketones, ur 20 (*)    Protein, ur 30 (*)    Bacteria, UA FEW (*)    All other components within normal limits  CULTURE, BLOOD (ROUTINE X 2)  CULTURE, BLOOD (ROUTINE X 2)  LIPASE, BLOOD  I-STAT TROPOININ, ED  I-STAT CG4 LACTIC ACID, ED    EKG  EKG Interpretation  Date/Time:  Thursday May 04 2016 14:09:30 EST Ventricular Rate:  102 PR Interval:    QRS Duration: 154 QT Interval:  407 QTC Calculation: 531 R Axis:   96 Text Interpretation:  AV dual-paced rhythm Paired ventricular premature complexes Nonspecific intraventricular conduction delay Anteroseptal infarct, old Repol abnrm, severe global ischemia (LM/MVD) No significant change since last tracing Confirmed by ZACKOWSKI  MD, SCOTT [phone]) on  05/04/2016 2:13:24 PM       Radiology Dg Chest 2 View  Result Date: 05/04/2016 CLINICAL DATA:  Increasing shortness of breath, vomiting for 4 days EXAM: CHEST  2 VIEW COMPARISON:  04/29/2015 FINDINGS: Left AICD remains in place, unchanged. Heart is borderline in size. Lungs are clear. No effusions or acute bony abnormality. IMPRESSION: Borderline heart size.  No active disease. Electronically Signed   By: Rolm Baptise M.D.   On: 05/04/2016 11:04   Ct Chest Wo Contrast  Result Date: 05/04/2016 CLINICAL DATA:  Shortness of breath and cough with question pneumonia seen on abdominal CT earlier today. EXAM: CT CHEST WITHOUT CONTRAST TECHNIQUE: Multidetector CT imaging of the chest was performed following the standard protocol without IV contrast. COMPARISON:  Chest x-ray and abdomen CT earlier today. FINDINGS: Cardiovascular: Heart size normal. No pericardial effusion. Coronary artery calcification is noted. Left permanent pacemaker noted. Mediastinum/Nodes: Scattered mediastinal lymph nodes evident without lymphadenopathy. No evidence for gross hilar lymphadenopathy although assessment is limited by the lack of intravenous contrast on today's study. The esophagus has normal imaging features. There is no axillary lymphadenopathy. Lungs/Pleura: Scattered areas of ground-glass attenuation are identified in the lungs bilaterally, right greater than left. Peripheral predominance noted. These changes are associated with diffuse bronchial wall thickening bilaterally. No dense focal airspace consolidation. No pulmonary edema or pleural effusion. Upper Abdomen: Unremarkable. Musculoskeletal: Bone windows reveal no worrisome lytic or sclerotic osseous lesions. IMPRESSION: 1. Scattered areas of ground-glass attenuation with a right-sided and peripheral predominance. Features are associated with diffuse bronchial wall thickening. Multifocal bronchopneumonia or atypical infection favored. Cryptogenic pneumonia also a  consideration although findings are more asymmetric than typically seen for that etiology. Septic emboli also considered much less likely given the asymmetry. Electronically Signed   By: Misty Stanley M.D.   On: 05/04/2016 13:12   Ct Abdomen Pelvis W Contrast  Result Date: 05/04/2016 CLINICAL DATA:  Nausea, vomiting, diarrhea for 4 days, constipation for 2 weeks, right lower quadrant pain EXAM: CT ABDOMEN AND PELVIS WITH CONTRAST TECHNIQUE: Multidetector CT imaging of the abdomen and pelvis was performed using the standard protocol following bolus administration of intravenous contrast. CONTRAST:  136mL ISOVUE-300 IOPAMIDOL (ISOVUE-300) INJECTION 61% COMPARISON:  None. FINDINGS: Lower chest: Lung bases shows small patchy infiltrate in right middle lobe suspicious for infection/pneumonitis. Partially visualized cardiac pacemaker leads. Hepatobiliary: Enhanced liver shows no focal abnormality. No calcified gallstones are noted within gallbladder. Pancreas: The enhanced pancreas shows moderate fatty replacement especially in the in the head and body of  the pancreas. No focal pancreatic mass. No pancreatic ductal dilatation. No definite evidence of acute pancreatitis or peripancreatic fluid. Spleen: Enhanced spleen with normal appearance. Adrenals/Urinary Tract: No adrenal gland mass. Enhanced kidneys are symmetrical in size. No hydronephrosis or hydroureter. No nephrolithiasis. Delayed renal images shows bilateral renal symmetrical excretion. Bilateral visualized proximal ureter is unremarkable. Stomach/Bowel: No small bowel obstruction. No thickened or dilated small bowel loops. The terminal ileum is unremarkable. No pericecal inflammation. Normal appendix is clearly identified retrocecal position axial image 64. Limited assessment of the colon which is empty collapsed. Only small amount of gas noted in mid sigmoid colon. No evidence of colitis or diverticulitis. Vascular/Lymphatic: Atherosclerotic calcifications  of abdominal aorta and iliac arteries. No aortic aneurysm. No adenopathy. Reproductive: Prostate gland and seminal vesicles are unremarkable. The urinary bladder is unremarkable. Other: No inguinal adenopathy. Small nonspecific bilateral inguinal lymph nodes are noted not pathologic by size criteria. There is no ascites or free abdominal air. Musculoskeletal: No destructive bony lesions are noted. Mild degenerative changes lower thoracic spine. Mild degenerative changes bilateral SI joints. IMPRESSION: 1. There is partially visualized patchy infiltrate in right middle lobe anterior and laterally. This is highly suspicious for atypical infection/ pneumonitis. Clinical correlation is necessary. Follow-up to resolution is recommended. 2. There is moderate fatty replacement of the pancreas especially in the region of body and head of the pancreas. No definite focal pancreatic mass. No pancreatic ductal dilatation. No definite evidence of acute pancreatitis. 3. No calcified gallstones are noted within gallbladder. No nephrolithiasis. No hydronephrosis or hydroureter. 4. Normal appendix. No pericecal inflammation. No small bowel obstruction. No evidence of colitis or diverticulitis. Limited assessment of the colon which is empty partially collapsed. 5. No ascites or free abdominal air. Electronically Signed   By: Lahoma Crocker M.D.   On: 05/04/2016 11:24    Procedures Procedures (including critical care time)  Medications Ordered in ED Medications  sodium chloride 0.9 % bolus 1,000 mL (0 mLs Intravenous Stopped 05/04/16 1240)  ondansetron (ZOFRAN) injection 4 mg (4 mg Intravenous Given 05/04/16 0953)  morphine 4 MG/ML injection 4 mg (4 mg Intravenous Given 05/04/16 0953)  iopamidol (ISOVUE-300) 61 % injection 125 mL (125 mLs Intravenous Contrast Given 05/04/16 1058)  cefTRIAXone (ROCEPHIN) 1 g in dextrose 5 % 50 mL IVPB (0 g Intravenous Stopped 05/04/16 1451)  azithromycin (ZITHROMAX) 500 mg in dextrose 5 % 250 mL IVPB (0  mg Intravenous Stopped 05/04/16 1644)  sodium chloride 0.9 % bolus 1,000 mL (0 mLs Intravenous Stopped 05/04/16 1644)     Initial Impression / Assessment and Plan / ED Course  I have reviewed the triage vital signs and the nursing notes.  Pertinent labs & imaging results that were available during my care of the patient were reviewed by me and considered in my medical decision making (see chart for details).     CBC unremarkable. CMP shows chloride 100, glucose 112, AST 78, total bilirubin 1.4. Lipase 19. Troponin negative. Lactate 1.13. UA shows small hematuria, 20 ketones, 30 protein. Blood cultures pending. EKG shows paced rhythm, but no [place] weight change since last tracing. CT abdomen pelvis shows partially visualized patchy infiltrate in the right middle lobe anteriorly and laterally highly suspicious for atypical infection/pneumonitis; moderate fatty replacement of the pancreas especially in the region of the body and head of the pancreas, no definite focal pancreatic mass, no pancreatic ductal dilatation, no acute pancreatitis; no calcified gallstones, nephrolithiasis, hydronephrosis or hydroureter; normal appendix, no pericecal inflammation, no bowel obstruction, no  colitis or diverticulitis; no ascites or free abdominal air. CT chest [shows Scattered areas of ground-glass attenuation with a right-sided and peripheral predominance. Features are associated with diffuse bronchial wall thickening. Multifocal bronchopneumonia or atypical infection favored. Cryptogenic pneumonia also a consideration although findings are more asymmetric than typically seen for that etiology. Septic emboli also considered much less likely given the asymmetry.] Will treat for atypical pneumonia. Patient given a azithromycin and Rocephin in ED. Discharge home with azithromycin. Patient's abdominal pain and nausea resolved in the ED with 1 dose of morphine and 1 dose of Zofran. Patient without pain or nausea 6 hours later.  Patient ambulatory with oxygen saturations at 96% prior to discharge. Patient to follow up with PCP in 2-3 days for recheck. Strict return precautions given. Patient understands and agrees with plan. Patient also evaluated by Dr. Rogene Houston who guided the patient's management and agrees with plan.  Final Clinical Impressions(s) / ED Diagnoses   Final diagnoses:  Community acquired pneumonia, unspecified laterality  Nausea vomiting and diarrhea  Generalized abdominal pain    New Prescriptions New Prescriptions   AZITHROMYCIN (ZITHROMAX) 250 MG TABLET    Take 1 tablet (250 mg total) by mouth daily. Take first 2 tablets together, then 1 every day until finished.     Frederica Kuster, PA-C 05/04/16 [place], MD 05/06/16 [phone]

## 2016-05-04 NOTE — ED Notes (Signed)
Advised patient we needed a urine specimen 

## 2016-05-08 ENCOUNTER — Emergency Department (HOSPITAL_COMMUNITY): Payer: Medicaid Other

## 2016-05-08 ENCOUNTER — Encounter (HOSPITAL_COMMUNITY): Payer: Self-pay | Admitting: Emergency Medicine

## 2016-05-08 ENCOUNTER — Inpatient Hospital Stay (HOSPITAL_COMMUNITY)
Admission: EM | Admit: 2016-05-08 | Discharge: 2016-05-12 | DRG: 388 | Disposition: A | Discharge status: Home / Self Care | Payer: Medicaid Other | Attending: Family Medicine | Admitting: Family Medicine

## 2016-05-08 DIAGNOSIS — R110 Nausea: Secondary | ICD-10-CM | POA: Diagnosis present

## 2016-05-08 DIAGNOSIS — Z79899 Other long term (current) drug therapy: Secondary | ICD-10-CM

## 2016-05-08 DIAGNOSIS — R9431 Abnormal electrocardiogram [ECG] [EKG]: Secondary | ICD-10-CM | POA: Diagnosis present

## 2016-05-08 DIAGNOSIS — E039 Hypothyroidism, unspecified: Secondary | ICD-10-CM | POA: Diagnosis present

## 2016-05-08 DIAGNOSIS — I4891 Unspecified atrial fibrillation: Secondary | ICD-10-CM

## 2016-05-08 DIAGNOSIS — R7401 Elevation of levels of liver transaminase levels: Secondary | ICD-10-CM

## 2016-05-08 DIAGNOSIS — Z87891 Personal history of nicotine dependence: Secondary | ICD-10-CM

## 2016-05-08 DIAGNOSIS — R1011 Right upper quadrant pain: Secondary | ICD-10-CM

## 2016-05-08 DIAGNOSIS — Z888 Allergy status to other drugs, medicaments and biological substances status: Secondary | ICD-10-CM

## 2016-05-08 DIAGNOSIS — Z8249 Family history of ischemic heart disease and other diseases of the circulatory system: Secondary | ICD-10-CM

## 2016-05-08 DIAGNOSIS — R7989 Other specified abnormal findings of blood chemistry: Secondary | ICD-10-CM

## 2016-05-08 DIAGNOSIS — I428 Other cardiomyopathies: Secondary | ICD-10-CM

## 2016-05-08 DIAGNOSIS — I5043 Acute on chronic combined systolic (congestive) and diastolic (congestive) heart failure: Secondary | ICD-10-CM

## 2016-05-08 DIAGNOSIS — R079 Chest pain, unspecified: Secondary | ICD-10-CM | POA: Diagnosis present

## 2016-05-08 DIAGNOSIS — R740 Nonspecific elevation of levels of transaminase and lactic acid dehydrogenase [LDH]: Secondary | ICD-10-CM | POA: Diagnosis present

## 2016-05-08 DIAGNOSIS — Z801 Family history of malignant neoplasm of trachea, bronchus and lung: Secondary | ICD-10-CM

## 2016-05-08 DIAGNOSIS — Z825 Family history of asthma and other chronic lower respiratory diseases: Secondary | ICD-10-CM

## 2016-05-08 DIAGNOSIS — K5900 Constipation, unspecified: Secondary | ICD-10-CM

## 2016-05-08 DIAGNOSIS — J189 Pneumonia, unspecified organism: Secondary | ICD-10-CM | POA: Diagnosis present

## 2016-05-08 DIAGNOSIS — E032 Hypothyroidism due to medicaments and other exogenous substances: Secondary | ICD-10-CM | POA: Diagnosis present

## 2016-05-08 DIAGNOSIS — I429 Cardiomyopathy, unspecified: Secondary | ICD-10-CM | POA: Diagnosis present

## 2016-05-08 DIAGNOSIS — I110 Hypertensive heart disease with heart failure: Secondary | ICD-10-CM | POA: Diagnosis present

## 2016-05-08 DIAGNOSIS — E876 Hypokalemia: Secondary | ICD-10-CM | POA: Diagnosis present

## 2016-05-08 DIAGNOSIS — Z95810 Presence of automatic (implantable) cardiac defibrillator: Secondary | ICD-10-CM

## 2016-05-08 DIAGNOSIS — R17 Unspecified jaundice: Secondary | ICD-10-CM

## 2016-05-08 DIAGNOSIS — I5042 Chronic combined systolic (congestive) and diastolic (congestive) heart failure: Secondary | ICD-10-CM | POA: Diagnosis present

## 2016-05-08 DIAGNOSIS — J440 Chronic obstructive pulmonary disease with acute lower respiratory infection: Secondary | ICD-10-CM | POA: Diagnosis present

## 2016-05-08 DIAGNOSIS — K567 Ileus, unspecified: Secondary | ICD-10-CM | POA: Diagnosis present

## 2016-05-08 DIAGNOSIS — R109 Unspecified abdominal pain: Secondary | ICD-10-CM

## 2016-05-08 HISTORY — DX: Presence of automatic (implantable) cardiac defibrillator: Z95.810

## 2016-05-08 LAB — I-STAT CHEM 8, ED
BUN: 10 mg/dL (ref 6–20)
CALCIUM ION: 1.02 mmol/L — AB (ref 1.15–1.40)
CHLORIDE: 100 mmol/L — AB (ref 101–111)
Creatinine, Ser: 1.1 mg/dL (ref 0.61–1.24)
GLUCOSE: 115 mg/dL — AB (ref 65–99)
HCT: 41 % (ref 39.0–52.0)
Hemoglobin: 13.9 g/dL (ref 13.0–17.0)
Potassium: 3.2 mmol/L — ABNORMAL LOW (ref 3.5–5.1)
SODIUM: 137 mmol/L (ref 135–145)
TCO2: 25 mmol/L (ref 0–100)

## 2016-05-08 LAB — COMPREHENSIVE METABOLIC PANEL
ALK PHOS: 50 U/L (ref 38–126)
ALT: 121 U/L — AB (ref 17–63)
AST: 124 U/L — ABNORMAL HIGH (ref 15–41)
Albumin: 3.3 g/dL — ABNORMAL LOW (ref 3.5–5.0)
Anion gap: 10 (ref 5–15)
BILIRUBIN TOTAL: 1.6 mg/dL — AB (ref 0.3–1.2)
BUN: 7 mg/dL (ref 6–20)
CALCIUM: 8.9 mg/dL (ref 8.9–10.3)
CO2: 24 mmol/L (ref 22–32)
CREATININE: 1.1 mg/dL (ref 0.61–1.24)
Chloride: 101 mmol/L (ref 101–111)
Glucose, Bld: 116 mg/dL — ABNORMAL HIGH (ref 65–99)
Potassium: 3.2 mmol/L — ABNORMAL LOW (ref 3.5–5.1)
Sodium: 135 mmol/L (ref 135–145)
Total Protein: 6.9 g/dL (ref 6.5–8.1)

## 2016-05-08 LAB — BRAIN NATRIURETIC PEPTIDE: B Natriuretic Peptide: 63.3 pg/mL (ref 0.0–100.0)

## 2016-05-08 LAB — CBC WITH DIFFERENTIAL/PLATELET
BASOS ABS: 0 10*3/uL (ref 0.0–0.1)
Basophils Relative: 0 %
EOS PCT: 1 %
Eosinophils Absolute: 0.1 10*3/uL (ref 0.0–0.7)
HEMATOCRIT: 40.3 % (ref 39.0–52.0)
HEMOGLOBIN: 13.7 g/dL (ref 13.0–17.0)
LYMPHS ABS: 1.4 10*3/uL (ref 0.7–4.0)
LYMPHS PCT: 14 %
MCH: 31.1 pg (ref 26.0–34.0)
MCHC: 34 g/dL (ref 30.0–36.0)
MCV: 91.4 fL (ref 78.0–100.0)
Monocytes Absolute: 1.2 10*3/uL — ABNORMAL HIGH (ref 0.1–1.0)
Monocytes Relative: 13 %
NEUTROS ABS: 6.8 10*3/uL (ref 1.7–7.7)
Neutrophils Relative %: 72 %
Platelets: 176 10*3/uL (ref 150–400)
RBC: 4.41 MIL/uL (ref 4.22–5.81)
RDW: 12.4 % (ref 11.5–15.5)
WBC: 9.5 10*3/uL (ref 4.0–10.5)

## 2016-05-08 LAB — LIPASE, BLOOD: Lipase: 21 U/L (ref 11–51)

## 2016-05-08 LAB — I-STAT TROPONIN, ED: TROPONIN I, POC: 0 ng/mL (ref 0.00–0.08)

## 2016-05-08 LAB — I-STAT CG4 LACTIC ACID, ED: LACTIC ACID, VENOUS: 1.49 mmol/L (ref 0.5–1.9)

## 2016-05-08 MED ORDER — SODIUM CHLORIDE 0.9 % IV BOLUS (SEPSIS)
1000.0000 mL | Freq: Once | INTRAVENOUS | Status: AC
  Administered 2016-05-08: 1000 mL via INTRAVENOUS

## 2016-05-08 MED ORDER — DILTIAZEM HCL 25 MG/5ML IV SOLN
10.0000 mg | Freq: Once | INTRAVENOUS | Status: AC
  Administered 2016-05-08: 10 mg via INTRAVENOUS
  Filled 2016-05-08: qty 5

## 2016-05-08 MED ORDER — VANCOMYCIN HCL 10 G IV SOLR
2500.0000 mg | Freq: Once | INTRAVENOUS | Status: AC
  Administered 2016-05-08: 2500 mg via INTRAVENOUS
  Filled 2016-05-08: qty 2500

## 2016-05-08 MED ORDER — PIPERACILLIN-TAZOBACTAM 3.375 G IVPB 30 MIN
3.3750 g | INTRAVENOUS | Status: AC
  Administered 2016-05-08: 3.375 g via INTRAVENOUS
  Filled 2016-05-08: qty 50

## 2016-05-08 NOTE — ED Notes (Signed)
Sent add on label to main lab.  (lipase)

## 2016-05-08 NOTE — ED Notes (Signed)
Pt transported to US

## 2016-05-08 NOTE — ED Triage Notes (Signed)
BIB EMS from home reporting L sided CP with radiation to back. Pt diaphoretic, N/V. Dx with pneumonia on 3/8 at [facility] . Pt tachycardic, still diaphoretic, currently rates pain 4/10.

## 2016-05-08 NOTE — ED Provider Notes (Signed)
[facility] DEPT Provider Note   CSN: [id number] Arrival date & time: 05/08/16  2057     History   Chief Complaint Chief Complaint  Patient presents with  . Chest Pain    HPI Gary Kirk is a 51 y.o. male.  HPI   51 year old male with history of CHF, A. fib, COPD, who presents with cough and shortness of breath. The patient was just recently seen on March 8. He was diagnosed with multifocal pneumonia at that time and discharged on azithromycin. Since then, patient has had persistently worsening right chest pain with radiation to his back. He has also been nauseous and has not tolerated by mouth intake over the last several days. He has associated fevers and chills and is diaphoretic on arrival. The pain as a sharp, pleuritic pain that is worse with inspiration. Denies any history of blood clots. No lower extremity edema. No other medical complaints.  Past Medical History:  Diagnosis Date  . A-fib ([place])   . CHF (congestive heart failure) ([place])   . COPD (chronic obstructive pulmonary disease) ([place])   . Hypertension     Patient Active Problem List   Diagnosis Date Noted  . Chest pain 05/09/2016  . Non-ischemic cardiomyopathy ([place])   . Nonspecific abnormal electrocardiogram (ECG) (EKG)   . Creatinine elevation   . Severe episode of recurrent major depressive disorder, without psychotic features ([place])   . Severe recurrent major depression without psychotic features ([place]) 05/24/2015  . MDD (major depressive disorder) 05/24/2015  . MDD (major depressive disorder), recurrent episode, severe ([place]) 05/24/2015    Past Surgical History:  Procedure Laterality Date  . CARDIAC DEFIBRILLATOR PLACEMENT         Home Medications    Prior to Admission medications   Medication Sig Start Date End Date Taking? Authorizing Provider  amiodarone (PACERONE) 400 MG tablet Take 1 tablet (400 mg total) by mouth daily. 06/02/15  Yes Kerrie Buffalo, NP  apixaban (ELIQUIS) 5 MG TABS  tablet Take 1 tablet (5 mg total) by mouth 2 (two) times daily. 06/02/15  Yes Kerrie Buffalo, NP  azithromycin (ZITHROMAX) 250 MG tablet Take 1 tablet (250 mg total) by mouth daily. Take first 2 tablets together, then 1 every day until finished. 05/04/16  Yes [place], PA-C  levothyroxine (SYNTHROID, LEVOTHROID) 75 MCG tablet Take 1 tablet (75 mcg total) by mouth daily before breakfast. 06/02/15  Yes Kerrie Buffalo, NP  NITROSTAT 0.4 MG SL tablet Place 1 tablet (0.4 mg total) under the tongue every five (5) minutes as needed for chest pain. 03/12/15  Yes Historical Provider, MD  ARIPiprazole (ABILIFY) 5 MG tablet Take 1 tablet (5 mg total) by mouth daily. Patient not taking: Reported on 05/08/2016 06/02/15   Kerrie Buffalo, NP  carvedilol (COREG) 25 MG tablet Take 1 tablet (25 mg total) by mouth 2 (two) times daily. Patient not taking: Reported on 05/08/2016 06/02/15   Kerrie Buffalo, NP  FLUoxetine (PROZAC) 10 MG capsule Take 1 capsule (10 mg total) by mouth daily. Patient not taking: Reported on 05/08/2016 06/02/15   Kerrie Buffalo, NP  furosemide (LASIX) 40 MG tablet Take 1 tablet (40 mg total) by mouth daily. Patient not taking: Reported on 05/08/2016 06/02/15   Kerrie Buffalo, NP  potassium chloride SA (K-DUR,KLOR-CON) 20 MEQ tablet Take 1 tablet (20 mEq total) by mouth daily. Patient not taking: Reported on 05/08/2016 06/02/15   Kerrie Buffalo, NP  tiotropium (SPIRIVA HANDIHALER) 18 MCG inhalation capsule Place 1 capsule (18 mcg  total) into inhaler and inhale daily as needed (for shortness of breath). Patient not taking: Reported on 05/08/2016 06/02/15   Kerrie Buffalo, NP  traZODone (DESYREL) 100 MG tablet Take 1 tablet (100 mg total) by mouth at bedtime as needed for sleep. Patient not taking: Reported on 05/08/2016 06/02/15   Kerrie Buffalo, NP    Family History No family history on file.  Social History Social History  Substance Use Topics  . Smoking status: Former Smoker    Quit date: 04/29/2014  .  Smokeless tobacco: Not on file  . Alcohol use No     Allergies   Tramadol; Abilify [aripiprazole]; and Motrin [ibuprofen]   Review of Systems Review of Systems  Constitutional: Positive for fatigue. Negative for chills and fever.  HENT: Negative for congestion and rhinorrhea.   Eyes: Negative for visual disturbance.  Respiratory: Positive for cough, chest tightness and shortness of breath. Negative for wheezing.   Cardiovascular: Positive for chest pain and palpitations. Negative for leg swelling.  Gastrointestinal: Negative for abdominal pain, diarrhea, nausea and vomiting.  Genitourinary: Negative for dysuria and flank pain.  Musculoskeletal: Negative for neck pain and neck stiffness.  Skin: Negative for rash and wound.  Allergic/Immunologic: Negative for immunocompromised state.  Neurological: Negative for syncope, weakness and headaches.  All other systems reviewed and are negative.    Physical Exam Updated Vital Signs BP 131/82   Pulse 102   Temp 98.2 F (36.8 C) (Oral)   Resp (!) 30   Ht 6\' 1"  (1.854 m)   Wt (!) 320 lb (145.2 kg)   SpO2 94%   BMI 42.22 kg/m   Physical Exam  Constitutional: He is oriented to person, place, and time. He appears well-developed and well-nourished. No distress.  HENT:  Head: Normocephalic and atraumatic.  Eyes: Conjunctivae are normal.  Neck: Neck supple.  Cardiovascular: Normal heart sounds.  An irregularly irregular rhythm present. Tachycardia present.  Exam reveals no friction rub.   No murmur heard. Pulmonary/Chest: Effort normal. Tachypnea noted. No respiratory distress. He has no wheezes. He has rhonchi in the right middle field and the right lower field. He has no rales.  Abdominal: He exhibits no distension.  Musculoskeletal: He exhibits no edema.  Neurological: He is alert and oriented to person, place, and time. He exhibits normal muscle tone.  Skin: Skin is warm. Capillary refill takes less than 2 seconds.  Psychiatric:  He has a normal mood and affect.  Nursing note and vitals reviewed.    ED Treatments / Results  Labs (all labs ordered are listed, but only abnormal results are displayed) Labs Reviewed  CBC WITH DIFFERENTIAL/PLATELET - Abnormal; Notable for the following:       Result Value   Monocytes Absolute 1.2 (*)    All other components within normal limits  COMPREHENSIVE METABOLIC PANEL - Abnormal; Notable for the following:    Potassium 3.2 (*)    Glucose, Bld 116 (*)    Albumin 3.3 (*)    AST 124 (*)    ALT 121 (*)    Total Bilirubin 1.6 (*)    All other components within normal limits  I-STAT CHEM 8, ED - Abnormal; Notable for the following:    Potassium 3.2 (*)    Chloride 100 (*)    Glucose, Bld 115 (*)    Calcium, Ion 1.02 (*)    All other components within normal limits  CULTURE, BLOOD (ROUTINE X 2)  CULTURE, BLOOD (ROUTINE X 2)  CULTURE, EXPECTORATED SPUTUM-ASSESSMENT  GRAM STAIN  MRSA PCR SCREENING  BRAIN NATRIURETIC PEPTIDE  LIPASE, BLOOD  TROPONIN I  TROPONIN I  TROPONIN I  STREP PNEUMONIAE URINARY ANTIGEN  LEGIONELLA PNEUMOPHILA SEROGP 1 UR AG  CBC WITH DIFFERENTIAL/PLATELET  COMPREHENSIVE METABOLIC PANEL  TSH  HEPATITIS PANEL, ACUTE  MAGNESIUM  I-STAT CG4 LACTIC ACID, ED  I-STAT TROPOININ, ED  I-STAT CG4 LACTIC ACID, ED    EKG  EKG Interpretation  Date/Time:  Monday May 08 2016 21:05:45 EDT Ventricular Rate:  104 PR Interval:    QRS Duration: 138 QT Interval:  432 QTC Calculation: 560 R Axis:   -94 Text Interpretation:  Atrial fibrillation Ventricular tachycardia, unsustained Nonspecific IVCD with LAD Anterior infarct, old Baseline wander in lead(s) I III aVL V2 V3 V4 V5 V6 Since lat tracing: LBBB has worsened Atrial fibrillation has replaced suspected sinus rhythm Confirmed by Manal Kreutzer MD, Lysbeth Galas [phone]) on 05/08/2016 9:08:54 PM       Radiology Dg Chest Portable 1 View  Result Date: 05/08/2016 CLINICAL DATA:  Chest pain and shortness of breath  since Tuesday of last week. History of pacemaker, COPD, congestive heart failure, atrial fibrillation, hypertension. Previous smoker. EXAM: PORTABLE CHEST 1 VIEW COMPARISON:  CT chest 05/04/2016.  Chest 05/04/2016. FINDINGS: Borderline heart size with normal pulmonary vascularity. Cardiac pacemaker. Mediastinal contours appear intact. Suggestion of vague opacities demonstrated in the right middle lung and both lower lungs probably corresponding to infiltrates better seen on previous CT. No progression. No blunting of costophrenic angles. No pneumothorax. IMPRESSION: Patchy areas of vague infiltration in the lungs likely corresponding to infiltrates seen on previous chest CT. No evidence of progression. Electronically Signed   By: Lucienne Capers M.D.   On: 05/08/2016 21:35   US Abdomen Limited Ruq  Result Date: 05/08/2016 CLINICAL DATA:  Right upper quadrant pain for 2 weeks EXAM: US ABDOMEN LIMITED - RIGHT UPPER QUADRANT COMPARISON:  05/04/2016 CT FINDINGS: Gallbladder: No gallstones or wall thickening visualized. No sonographic Murphy sign noted by sonographer. Common bile duct: Diameter: Normal at 3.3 mm Liver: No focal lesion identified. Within normal limits in parenchymal echogenicity. IMPRESSION: Negative right upper quadrant abdominal ultrasound Electronically Signed   By: Donavan Foil M.D.   On: 05/08/2016 23:26    Procedures Procedures (including critical care time)  Medications Ordered in ED Medications  apixaban (ELIQUIS) tablet 5 mg (not administered)  nitroGLYCERIN (NITROSTAT) SL tablet 0.4 mg (not administered)  levothyroxine (SYNTHROID, LEVOTHROID) tablet 75 mcg (not administered)  acetaminophen (TYLENOL) tablet 650 mg (not administered)  ondansetron (ZOFRAN) injection 4 mg (not administered)  fentaNYL (SUBLIMAZE) injection 25-50 mcg (not administered)  cefTRIAXone (ROCEPHIN) 1 g in dextrose 5 % 50 mL IVPB (not administered)  ipratropium-albuterol (DUONEB) 0.5-2.5 (3) MG/3ML  nebulizer solution 3 mL (not administered)  sodium chloride 0.9 % bolus 1,000 mL (0 mLs Intravenous Stopped 05/08/16 2249)  diltiazem (CARDIZEM) injection 10 mg (10 mg Intravenous Given 05/08/16 2331)  sodium chloride 0.9 % bolus 1,000 mL (0 mLs Intravenous Stopped 05/09/16 0106)  vancomycin (VANCOCIN) 2,500 mg in sodium chloride 0.9 % 500 mL IVPB (0 mg Intravenous Stopped 05/09/16 0140)  piperacillin-tazobactam (ZOSYN) IVPB 3.375 g (0 g Intravenous Stopped 05/09/16 0021)  ondansetron (ZOFRAN) injection 4 mg (4 mg Intravenous Given 05/09/16 0020)  fentaNYL (SUBLIMAZE) injection 50 mcg (50 mcg Intravenous Given 05/09/16 0020)  potassium chloride SA (K-DUR,KLOR-CON) CR tablet 40 mEq (40 mEq Oral Given 05/09/16 0021)  ipratropium-albuterol (DUONEB) 0.5-2.5 (3) MG/3ML nebulizer solution 3 mL (3 mLs Nebulization Given 05/09/16 0020)  predniSONE (DELTASONE) tablet 60 mg (60 mg Oral Given 05/09/16 0020)     Initial Impression / Assessment and Plan / ED Course  I have reviewed the triage vital signs and the nursing notes.  Pertinent labs & imaging results that were available during my care of the patient were reviewed by me and considered in my medical decision making (see chart for details).     51 year old male with history of A. fib, CHF, COPD, who presents with worsening cough and shortness of breath despite outpatient antibiotics for recently diagnosed pneumonia. On arrival, patient is diaphoretic, tachycardic with atrial fibrillation, but satting well. Chest x-ray does confirm multifocal pneumonia, which does appear more severe than previous as it was not producing noted on chest x-ray. Otherwise, screening labwork is overall unremarkable. His EKG is without signs of acute ischemia. He does report inability to tolerate by mouth intake and appears clinically dry. Will give fluids, antibiotics.  Of note, LFTs elevated from previous. This may be secondary to his vomiting and dehydration, but given  transaminitis on LFTs, right upper quadrant ultrasound obtained and is negative. CT abdomen pelvis on recent visit was negative and I do not feel he needs a repeat. Will continue fluids.  Given persistent tachycardia, failure of outpt ABX, and comorbidities, will admit for fluids, IV ABX, and observation.  Final Clinical Impressions(s) / ED Diagnoses   Final diagnoses:  RUQ pain  Multifocal pneumonia  Atrial fibrillation with rapid ventricular response ([place])  Transaminitis  Elevated bilirubin    New Prescriptions Current Discharge Medication List       Duffy Bruce, MD 05/09/16 0222

## 2016-05-09 ENCOUNTER — Encounter (HOSPITAL_COMMUNITY): Payer: Self-pay

## 2016-05-09 ENCOUNTER — Observation Stay (HOSPITAL_COMMUNITY): Payer: Medicaid Other

## 2016-05-09 DIAGNOSIS — Z79899 Other long term (current) drug therapy: Secondary | ICD-10-CM | POA: Diagnosis not present

## 2016-05-09 DIAGNOSIS — Z95810 Presence of automatic (implantable) cardiac defibrillator: Secondary | ICD-10-CM | POA: Diagnosis not present

## 2016-05-09 DIAGNOSIS — Z8249 Family history of ischemic heart disease and other diseases of the circulatory system: Secondary | ICD-10-CM | POA: Diagnosis not present

## 2016-05-09 DIAGNOSIS — Z825 Family history of asthma and other chronic lower respiratory diseases: Secondary | ICD-10-CM | POA: Diagnosis not present

## 2016-05-09 DIAGNOSIS — I4891 Unspecified atrial fibrillation: Secondary | ICD-10-CM | POA: Diagnosis present

## 2016-05-09 DIAGNOSIS — J189 Pneumonia, unspecified organism: Secondary | ICD-10-CM | POA: Diagnosis present

## 2016-05-09 DIAGNOSIS — R112 Nausea with vomiting, unspecified: Secondary | ICD-10-CM | POA: Diagnosis not present

## 2016-05-09 DIAGNOSIS — I429 Cardiomyopathy, unspecified: Secondary | ICD-10-CM | POA: Diagnosis present

## 2016-05-09 DIAGNOSIS — R1011 Right upper quadrant pain: Secondary | ICD-10-CM

## 2016-05-09 DIAGNOSIS — K567 Ileus, unspecified: Secondary | ICD-10-CM | POA: Diagnosis present

## 2016-05-09 DIAGNOSIS — R740 Nonspecific elevation of levels of transaminase and lactic acid dehydrogenase [LDH]: Secondary | ICD-10-CM | POA: Diagnosis present

## 2016-05-09 DIAGNOSIS — Z87891 Personal history of nicotine dependence: Secondary | ICD-10-CM | POA: Diagnosis not present

## 2016-05-09 DIAGNOSIS — I5043 Acute on chronic combined systolic (congestive) and diastolic (congestive) heart failure: Secondary | ICD-10-CM | POA: Diagnosis not present

## 2016-05-09 DIAGNOSIS — E876 Hypokalemia: Secondary | ICD-10-CM | POA: Diagnosis present

## 2016-05-09 DIAGNOSIS — R0789 Other chest pain: Secondary | ICD-10-CM | POA: Diagnosis not present

## 2016-05-09 DIAGNOSIS — R1084 Generalized abdominal pain: Secondary | ICD-10-CM | POA: Diagnosis not present

## 2016-05-09 DIAGNOSIS — I110 Hypertensive heart disease with heart failure: Secondary | ICD-10-CM | POA: Diagnosis present

## 2016-05-09 DIAGNOSIS — R079 Chest pain, unspecified: Secondary | ICD-10-CM | POA: Diagnosis not present

## 2016-05-09 DIAGNOSIS — E039 Hypothyroidism, unspecified: Secondary | ICD-10-CM | POA: Diagnosis not present

## 2016-05-09 DIAGNOSIS — R946 Abnormal results of thyroid function studies: Secondary | ICD-10-CM | POA: Diagnosis not present

## 2016-05-09 DIAGNOSIS — R9431 Abnormal electrocardiogram [ECG] [EKG]: Secondary | ICD-10-CM

## 2016-05-09 DIAGNOSIS — I428 Other cardiomyopathies: Secondary | ICD-10-CM | POA: Diagnosis not present

## 2016-05-09 DIAGNOSIS — J440 Chronic obstructive pulmonary disease with acute lower respiratory infection: Secondary | ICD-10-CM | POA: Diagnosis present

## 2016-05-09 DIAGNOSIS — R110 Nausea: Secondary | ICD-10-CM | POA: Diagnosis present

## 2016-05-09 DIAGNOSIS — Z888 Allergy status to other drugs, medicaments and biological substances status: Secondary | ICD-10-CM | POA: Diagnosis not present

## 2016-05-09 DIAGNOSIS — Z801 Family history of malignant neoplasm of trachea, bronchus and lung: Secondary | ICD-10-CM | POA: Diagnosis not present

## 2016-05-09 DIAGNOSIS — I5042 Chronic combined systolic (congestive) and diastolic (congestive) heart failure: Secondary | ICD-10-CM | POA: Diagnosis present

## 2016-05-09 DIAGNOSIS — E032 Hypothyroidism due to medicaments and other exogenous substances: Secondary | ICD-10-CM | POA: Diagnosis present

## 2016-05-09 LAB — COMPREHENSIVE METABOLIC PANEL
ALT: 109 U/L — ABNORMAL HIGH (ref 17–63)
ANION GAP: 7 (ref 5–15)
AST: 105 U/L — AB (ref 15–41)
Albumin: 3 g/dL — ABNORMAL LOW (ref 3.5–5.0)
Alkaline Phosphatase: 45 U/L (ref 38–126)
BILIRUBIN TOTAL: 1.6 mg/dL — AB (ref 0.3–1.2)
BUN: 9 mg/dL (ref 6–20)
CHLORIDE: 103 mmol/L (ref 101–111)
CO2: 27 mmol/L (ref 22–32)
Calcium: 8.5 mg/dL — ABNORMAL LOW (ref 8.9–10.3)
Creatinine, Ser: 1.01 mg/dL (ref 0.61–1.24)
GFR calc Af Amer: >60 mL/min (ref >60)
GLUCOSE: 130 mg/dL — AB (ref 65–99)
POTASSIUM: 3.8 mmol/L (ref 3.5–5.1)
Sodium: 137 mmol/L (ref 135–145)
TOTAL PROTEIN: 6.3 g/dL — AB (ref 6.5–8.1)

## 2016-05-09 LAB — STREP PNEUMONIAE URINARY ANTIGEN: STREP PNEUMO URINARY ANTIGEN: NEGATIVE

## 2016-05-09 LAB — CBC WITH DIFFERENTIAL/PLATELET
BASOS ABS: 0 10*3/uL (ref 0.0–0.1)
BASOS PCT: 0 %
EOS PCT: 0 %
Eosinophils Absolute: 0 10*3/uL (ref 0.0–0.7)
HEMATOCRIT: 37.9 % — AB (ref 39.0–52.0)
Hemoglobin: 12.7 g/dL — ABNORMAL LOW (ref 13.0–17.0)
Lymphocytes Relative: 8 %
Lymphs Abs: 0.4 10*3/uL — ABNORMAL LOW (ref 0.7–4.0)
MCH: 31.1 pg (ref 26.0–34.0)
MCHC: 33.5 g/dL (ref 30.0–36.0)
MCV: 92.7 fL (ref 78.0–100.0)
MONO ABS: 0.2 10*3/uL (ref 0.1–1.0)
MONOS PCT: 4 %
Neutro Abs: 4.6 10*3/uL (ref 1.7–7.7)
Neutrophils Relative %: 88 %
PLATELETS: 153 10*3/uL (ref 150–400)
RBC: 4.09 MIL/uL — ABNORMAL LOW (ref 4.22–5.81)
RDW: 12.7 % (ref 11.5–15.5)
WBC: 5.2 10*3/uL (ref 4.0–10.5)

## 2016-05-09 LAB — TROPONIN I: TROPONIN I: 0.03 ng/mL — AB (ref <0.03)

## 2016-05-09 LAB — MAGNESIUM: Magnesium: 1.8 mg/dL (ref 1.7–2.4)

## 2016-05-09 LAB — CULTURE, BLOOD (ROUTINE X 2)
Culture: NO GROWTH
Culture: NO GROWTH

## 2016-05-09 LAB — MRSA PCR SCREENING: MRSA BY PCR: NEGATIVE

## 2016-05-09 LAB — TSH

## 2016-05-09 LAB — I-STAT CG4 LACTIC ACID, ED: LACTIC ACID, VENOUS: 1.12 mmol/L (ref 0.5–1.9)

## 2016-05-09 MED ORDER — DEXTROSE 5 % IV SOLN
1.0000 g | INTRAVENOUS | Status: DC
  Administered 2016-05-09 – 2016-05-12 (×4): 1 g via INTRAVENOUS
  Filled 2016-05-09 (×7): qty 10

## 2016-05-09 MED ORDER — ACETAMINOPHEN 325 MG PO TABS
650.0000 mg | ORAL_TABLET | ORAL | Status: DC | PRN
  Administered 2016-05-09 – 2016-05-11 (×2): 650 mg via ORAL
  Filled 2016-05-09 (×2): qty 2

## 2016-05-09 MED ORDER — ONDANSETRON HCL 4 MG/2ML IJ SOLN
4.0000 mg | Freq: Four times a day (QID) | INTRAMUSCULAR | Status: DC | PRN
  Administered 2016-05-09 – 2016-05-11 (×6): 4 mg via INTRAVENOUS
  Filled 2016-05-09 (×7): qty 2

## 2016-05-09 MED ORDER — AMIODARONE HCL 200 MG PO TABS: 400.0000 mg | ORAL_TABLET | Freq: Every day | ORAL | Status: DC

## 2016-05-09 MED ORDER — PREDNISONE 20 MG PO TABS
60.0000 mg | ORAL_TABLET | Freq: Once | ORAL | Status: AC
  Administered 2016-05-09: 60 mg via ORAL
  Filled 2016-05-09: qty 3

## 2016-05-09 MED ORDER — POTASSIUM CHLORIDE CRYS ER 20 MEQ PO TBCR
40.0000 meq | EXTENDED_RELEASE_TABLET | Freq: Once | ORAL | Status: AC
  Administered 2016-05-09: 40 meq via ORAL
  Filled 2016-05-09: qty 2

## 2016-05-09 MED ORDER — DEXTROSE-NACL 5-0.45 % IV SOLN
INTRAVENOUS | Status: DC
  Administered 2016-05-09: 100 mL/h via INTRAVENOUS
  Administered 2016-05-10 – 2016-05-11 (×2): via INTRAVENOUS
  Administered 2016-05-11: 1000 mL via INTRAVENOUS
  Administered 2016-05-12: 03:00:00 via INTRAVENOUS

## 2016-05-09 MED ORDER — LEVOTHYROXINE SODIUM 75 MCG PO TABS
75.0000 ug | ORAL_TABLET | Freq: Every day | ORAL | Status: DC
  Administered 2016-05-09 – 2016-05-10 (×2): 75 ug via ORAL
  Filled 2016-05-09 (×2): qty 1

## 2016-05-09 MED ORDER — IPRATROPIUM-ALBUTEROL 0.5-2.5 (3) MG/3ML IN SOLN
3.0000 mL | Freq: Once | RESPIRATORY_TRACT | Status: AC
  Administered 2016-05-09: 3 mL via RESPIRATORY_TRACT
  Filled 2016-05-09: qty 3

## 2016-05-09 MED ORDER — FENTANYL CITRATE (PF) 100 MCG/2ML IJ SOLN
25.0000 ug | INTRAMUSCULAR | Status: DC | PRN
  Administered 2016-05-09 (×3): 50 ug via INTRAVENOUS
  Administered 2016-05-10: 25 ug via INTRAVENOUS
  Administered 2016-05-10: 50 ug via INTRAVENOUS
  Administered 2016-05-11: 25 ug via INTRAVENOUS
  Filled 2016-05-09 (×6): qty 2

## 2016-05-09 MED ORDER — ONDANSETRON HCL 4 MG/2ML IJ SOLN
4.0000 mg | Freq: Once | INTRAMUSCULAR | Status: AC
  Administered 2016-05-09: 4 mg via INTRAVENOUS
  Filled 2016-05-09: qty 2

## 2016-05-09 MED ORDER — NITROGLYCERIN 0.4 MG SL SUBL: 0.4000 mg | SUBLINGUAL_TABLET | SUBLINGUAL | Status: DC | PRN

## 2016-05-09 MED ORDER — APIXABAN 5 MG PO TABS
5.0000 mg | ORAL_TABLET | Freq: Two times a day (BID) | ORAL | Status: DC
  Administered 2016-05-09 – 2016-05-12 (×6): 5 mg via ORAL
  Filled 2016-05-09 (×6): qty 1

## 2016-05-09 MED ORDER — FENTANYL CITRATE (PF) 100 MCG/2ML IJ SOLN
50.0000 ug | Freq: Once | INTRAMUSCULAR | Status: AC
  Administered 2016-05-09: 50 ug via INTRAVENOUS
  Filled 2016-05-09: qty 2

## 2016-05-09 MED ORDER — IPRATROPIUM-ALBUTEROL 0.5-2.5 (3) MG/3ML IN SOLN: 3.0000 mL | RESPIRATORY_TRACT | Status: DC | PRN

## 2016-05-09 NOTE — Progress Notes (Signed)
Patient seen and examined at bedside, patient admitted after midnight, please see earlier detailed admission note by Dr. Tamala Julian. Briefly, patient presented with atypical chest pain. Chest pain has resolved, but he is having some abdominal pain in addition to nausea and vomiting. AST/ALT improved since yesterday. Possible he had an obstructing stone that passed. KUB obtained for abdominal pain and constipation which was significant for ileus vs gastroenteritis. No diarrhea. Will treat as ileus; make NPO. Continue antibiotics for CAP.   Cordelia Poche, MD [facility] 05/09/2016, 11:25 AM Pager: [phone]

## 2016-05-09 NOTE — H&P (Addendum)
History and Physical    Gary Kirk LNL:[id number] DOB: [birth year] DOA: 05/08/2016  Referring MD/NP/PA: Dr. Bland Span PCP: No PCP Per Patient  Patient coming from: Home  Chief Complaint:  "My chest started hurting"  HPI: Gary Kirk is a 51 y.o. male with medical history significant of HTN, A. fib, CHF, COPD, and s/p AICD; who presents with complaints of chest pain since yesterday. Pain is described as sharp, substernal, and radiates to his back. Pain waxes and wanes in intensity. Denies trying anything to relieve symptoms. Associated symptoms include nausea, vomiting, nonproductive cough, decreased appetite, abdominal pain, constipation, and diaphoresis for the last 7 days. Recently seen at [facility] 5 days ago, diagnosed with multifocal pneumonia for which he was prescribed azithromycin.  He reports taking this medicine as prescribed without relief of symptoms.  He also reported taking laxatives for constipation, but CT scan of abdomen which was obtained during his ED visit showed no signs of obstruction or significant stool burden.  ED Course: Upon admission patient was seen to be afebrile, pulse 80-117, respirations 14-30, and all other vital signs within normal limits. Lab work revealed potassium 3.2, AST 124, ALT 121, Total Bilirubin 1.6, lipase 21, BNP 63.3, and  troponin 0.0. EKG showing paced rhythm with prolonged QTC of 560. Patient was initially given 10 mg of diltiazem for elevated heart rates into the 120s on admission, Zofran for nausea vomiting symptoms, and empiric antibiotics of vancomycin and cefepime.   Review of Systems: As per HPI otherwise 10 point review of systems negative.   Past Medical History:  Diagnosis Date  . A-fib ([place])   . CHF (congestive heart failure) ([place])   . COPD (chronic obstructive pulmonary disease) ([place])   . Hypertension     Past Surgical History:  Procedure Laterality Date  . CARDIAC DEFIBRILLATOR PLACEMENT       reports that he quit  smoking about 2 years ago. He does not have any smokeless tobacco history on file. He reports that he does not drink alcohol or use drugs.  Allergies  Allergen Reactions  . Tramadol Shortness Of Breath    And, flu-like symptoms  . Abilify [Aripiprazole] Other (See Comments)    Had negative side effects (on him)  . Motrin [Ibuprofen] Other (See Comments)    Spits up blood    No family history on file.  Prior to Admission medications   Medication Sig Start Date End Date Taking? Authorizing Provider  amiodarone (PACERONE) 400 MG tablet Take 1 tablet (400 mg total) by mouth daily. 06/02/15  Yes Kerrie Buffalo, NP  apixaban (ELIQUIS) 5 MG TABS tablet Take 1 tablet (5 mg total) by mouth 2 (two) times daily. 06/02/15  Yes Kerrie Buffalo, NP  azithromycin (ZITHROMAX) 250 MG tablet Take 1 tablet (250 mg total) by mouth daily. Take first 2 tablets together, then 1 every day until finished. 05/04/16  Yes [place], PA-C  levothyroxine (SYNTHROID, LEVOTHROID) 75 MCG tablet Take 1 tablet (75 mcg total) by mouth daily before breakfast. 06/02/15  Yes Kerrie Buffalo, NP  NITROSTAT 0.4 MG SL tablet Place 1 tablet (0.4 mg total) under the tongue every five (5) minutes as needed for chest pain. 03/12/15  Yes Historical Provider, MD  ARIPiprazole (ABILIFY) 5 MG tablet Take 1 tablet (5 mg total) by mouth daily. Patient not taking: Reported on 05/08/2016 06/02/15   Kerrie Buffalo, NP  carvedilol (COREG) 25 MG tablet Take 1 tablet (25 mg total) by mouth 2 (two) times daily.  Patient not taking: Reported on 05/08/2016 06/02/15   Kerrie Buffalo, NP  FLUoxetine (PROZAC) 10 MG capsule Take 1 capsule (10 mg total) by mouth daily. Patient not taking: Reported on 05/08/2016 06/02/15   Kerrie Buffalo, NP  furosemide (LASIX) 40 MG tablet Take 1 tablet (40 mg total) by mouth daily. Patient not taking: Reported on 05/08/2016 06/02/15   Kerrie Buffalo, NP  potassium chloride SA (K-DUR,KLOR-CON) 20 MEQ tablet Take 1 tablet (20 mEq total)  by mouth daily. Patient not taking: Reported on 05/08/2016 06/02/15   Kerrie Buffalo, NP  tiotropium (SPIRIVA HANDIHALER) 18 MCG inhalation capsule Place 1 capsule (18 mcg total) into inhaler and inhale daily as needed (for shortness of breath). Patient not taking: Reported on 05/08/2016 06/02/15   Kerrie Buffalo, NP  traZODone (DESYREL) 100 MG tablet Take 1 tablet (100 mg total) by mouth at bedtime as needed for sleep. Patient not taking: Reported on 05/08/2016 06/02/15   Kerrie Buffalo, NP    Physical Exam: Constitutional: Obese male in NAD, calm, comfortable Vitals:   05/08/16 2110 05/08/16 2130 05/08/16 2200 05/08/16 2351  BP:  159/82 122/81 130/74  Pulse:  117 98 90  Resp:  19 (!) 27 16  Temp:      TempSrc:      SpO2:  97% 97% 97%  Weight: (!) 145.2 kg (320 lb)     Height: 6\' 1"  (1.854 m)      Eyes: PERRL, lids and conjunctivae normal ENMT: Mucous membranes are moist. Posterior pharynx clear of any exudate or lesions.Normal dentition.  Neck: normal, supple, no masses, no thyromegaly Respiratory: Tachypneic with rhonchi appreciated in the right lung fields. No expiratory wheezes or rales appreciated. Cardiovascular: Irregular irregular no murmurs / rubs / gallops. No extremity edema. 2+ pedal pulses. No carotid bruits.  Abdomen: Moderate right upper quadrant tenderness, no masses palpated. No hepatosplenomegaly. Bowel sounds positive.  Musculoskeletal: no clubbing / cyanosis. No joint deformity upper and lower extremities. Good ROM, no contractures. Normal muscle tone.  Skin: no rashes, lesions, ulcers. Diaphoretic. Neurologic: CN 2-12 grossly intact. Sensation intact, DTR normal. Strength 5/5 in all 4.  Psychiatric: Normal judgment and insight. Alert and oriented x 3. Normal mood.     Labs on Admission: I have personally reviewed following labs and imaging studies  CBC:  Recent Labs Lab 05/04/16 0943 05/08/16 2111 05/08/16 2138  WBC 8.0 9.5  --   NEUTROABS 5.8 6.8  --   HGB  14.8 13.7 13.9  HCT 42.7 40.3 41.0  MCV 92.0 91.4  --   PLT 150 176  --    Basic Metabolic Panel:  Recent Labs Lab 05/04/16 0943 05/08/16 2111 05/08/16 2138  NA 136 135 137  K 3.5 3.2* 3.2*  CL 100* 101 100*  CO2 26 24  --   GLUCOSE 112* 116* 115*  BUN 13 7 10   CREATININE 1.10 1.10 1.10  CALCIUM 9.3 8.9  --    GFR: Estimated Creatinine Clearance: 119.1 mL/min (by C-G formula based on SCr of 1.1 mg/dL). Liver Function Tests:  Recent Labs Lab 05/04/16 0943 05/08/16 2111  AST 78* 124*  ALT 60 121*  ALKPHOS 52 50  BILITOT 1.4* 1.6*  PROT 7.5 6.9  ALBUMIN 3.6 3.3*    Recent Labs Lab 05/04/16 0943 05/08/16 2111  LIPASE 19 21   No results for input(s): AMMONIA in the last 168 hours. Coagulation Profile: No results for input(s): INR, PROTIME in the last 168 hours. Cardiac Enzymes: No results for input(s):  CKTOTAL, CKMB, CKMBINDEX, TROPONINI in the last 168 hours. BNP (last 3 results) No results for input(s): PROBNP in the last 8760 hours. HbA1C: No results for input(s): HGBA1C in the last 72 hours. CBG: No results for input(s): GLUCAP in the last 168 hours. Lipid Profile: No results for input(s): CHOL, HDL, LDLCALC, TRIG, CHOLHDL, LDLDIRECT in the last 72 hours. Thyroid Function Tests: No results for input(s): TSH, T4TOTAL, FREET4, T3FREE, THYROIDAB in the last 72 hours. Anemia Panel: No results for input(s): VITAMINB12, FOLATE, FERRITIN, TIBC, IRON, RETICCTPCT in the last 72 hours. Urine analysis:    Component Value Date/Time   COLORURINE YELLOW 05/04/2016 1300   APPEARANCEUR CLEAR 05/04/2016 1300   LABSPEC >1.046 (H) 05/04/2016 1300   PHURINE 5.0 05/04/2016 1300   GLUCOSEU NEGATIVE 05/04/2016 1300   HGBUR SMALL (A) 05/04/2016 1300   BILIRUBINUR NEGATIVE 05/04/2016 1300   KETONESUR 20 (A) 05/04/2016 1300   PROTEINUR 30 (A) 05/04/2016 1300   UROBILINOGEN 0.2 06/09/2008 1158   NITRITE NEGATIVE 05/04/2016 1300   LEUKOCYTESUR NEGATIVE 05/04/2016 1300    Sepsis Labs: Recent Results (from the past 240 hour(s))  Blood culture (routine x 2)     Status: None (Preliminary result)   Collection Time: 05/04/16 10:39 AM  Result Value Ref Range Status   Specimen Description BLOOD LEFT HAND  Final   Special Requests BOTTLES DRAWN AEROBIC AND ANAEROBIC 4CC EACH  Final   Culture NO GROWTH 4 DAYS  Final   Report Status PENDING  Incomplete  Blood culture (routine x 2)     Status: None (Preliminary result)   Collection Time: 05/04/16 10:43 AM  Result Value Ref Range Status   Specimen Description BLOOD LEFT FOREARM  Final   Special Requests BOTTLES DRAWN AEROBIC AND ANAEROBIC 4CC EACH  Final   Culture NO GROWTH 4 DAYS  Final   Report Status PENDING  Incomplete     Radiological Exams on Admission: Dg Chest Portable 1 View  Result Date: 05/08/2016 CLINICAL DATA:  Chest pain and shortness of breath since Tuesday of last week. History of pacemaker, COPD, congestive heart failure, atrial fibrillation, hypertension. Previous smoker. EXAM: PORTABLE CHEST 1 VIEW COMPARISON:  CT chest 05/04/2016.  Chest 05/04/2016. FINDINGS: Borderline heart size with normal pulmonary vascularity. Cardiac pacemaker. Mediastinal contours appear intact. Suggestion of vague opacities demonstrated in the right middle lung and both lower lungs probably corresponding to infiltrates better seen on previous CT. No progression. No blunting of costophrenic angles. No pneumothorax. IMPRESSION: Patchy areas of vague infiltration in the lungs likely corresponding to infiltrates seen on previous chest CT. No evidence of progression. Electronically Signed   By: Lucienne Capers M.D.   On: 05/08/2016 21:35   US Abdomen Limited Ruq  Result Date: 05/08/2016 CLINICAL DATA:  Right upper quadrant pain for 2 weeks EXAM: US ABDOMEN LIMITED - RIGHT UPPER QUADRANT COMPARISON:  05/04/2016 CT FINDINGS: Gallbladder: No gallstones or wall thickening visualized. No sonographic Murphy sign noted by  sonographer. Common bile duct: Diameter: Normal at 3.3 mm Liver: No focal lesion identified. Within normal limits in parenchymal echogenicity. IMPRESSION: Negative right upper quadrant abdominal ultrasound Electronically Signed   By: Donavan Foil M.D.   On: 05/08/2016 23:26    EKG: Independently reviewed. Afib 104 with LBBB and QTc 560.  Assessment/Plan Chest pain: Acute. Patient presents with complaints of substernal chest pain that he states radiates to his back.  Worsening of symptoms secondary to community-acquired pneumonia. - Admit stepdown for continued chest pain complaints - trend cardiac  enzymes - Fentanyl prn pain - Determine if further cardiac workup needed including echocardiogram, cardiology consult, etc  Community-acquired pneumonia: Acute. Patient was previously treated with azithromycin as an outpatient without relief of symptoms. Chest x-ray still showing patchy areas of infiltrates on the lungs. - Check sputum cultures and urine studies - De-escalate antibiotics to Ceftriaxone IV   - mucinex  Prolonged QT interval: Acutely worsened. QTC noted to be 560 on admission. - Correcting electrolyte abnormalities - Rechecking EKG at 5 AM - Currently holding medications that can further prolonged QTC  Atrial fibrillation: First initial heart rates were noted to be in the 120s for which he was given 10 mg of diltiazem. - will need to restart amiodarone, if QTc improved  Hypokalemia: Patient's potassium was 3.2 on admission. He was given 40 mEq of potassium chloride while in the ED. - Check magnesium and replace if needed - Continue to monitor potassium and replace as needed  Nausea and Vomiting - Clear liquid diet  - Advance diet as tolerated  Transaminitis and right upper quadrant pain: Acute. Patient's AST 124, ALT 121, and total bilirubin 1.6 on admission. CT scan from 5 days ago showed no acute abnormalities. Ultrasound obtained today also showed no acute abnormalities.  Question cause at this time. - Check acute hepatitis panel - Check fractionated bilirubin - may warrant GI consult if not improving   COPD  -  Duonebs prn sob/ wheezing  Nonischemic cardiomyopathy s/p AICD: BNP 63.3 on admission and does not appear to be fluid overloaded. ICD last interrogated on 01/2016 per records seen on care everywhere. - strict in and outs  Hypothyroidism - added-on TSH - Continue levothyroxine   DVT prophylaxis:  Eliquis Code Status: Full  Family Communication: no family at bedside Disposition Plan: Likely discharge  Consults called: None  Admission status: observation  Norval Morton MD [facility] Pager [phone]  If 7PM-7AM, please contact night-coverage [email or web address] Password [facility]  05/09/2016, 12:57 AM

## 2016-05-09 NOTE — Progress Notes (Signed)
Pt with ileus and is NPO. Due Eliquis tonight. He takes this for Afib. Could not find charted CHADsvas score on previous cardiology visits. Calculated to a score of 2. Low risk to hold Eliquis tonight. His rate is also controlled. SCDs for tonight for DVT ppx.  Discussed with Dr. Loleta Books of [facility] who agrees. KJKG, NP [facility]

## 2016-05-09 NOTE — Progress Notes (Signed)
Initial Nutrition Assessment  DOCUMENTATION CODES:   Morbid obesity  INTERVENTION:   -RD will follow for diet advancement and supplement as appropriate  NUTRITION DIAGNOSIS:   Inadequate oral intake related to altered GI function as evidenced by NPO status.  GOAL:   Patient will meet greater than or equal to 90% of their needs  MONITOR:   Diet advancement, Labs, Weight trends, Skin, I & O's  REASON FOR ASSESSMENT:   Malnutrition Screening Tool    ASSESSMENT:   Gary Kirk is a 51 y.o. male with medical history significant of HTN, A. fib, CHF, COPD, and s/p AICD; who presents with complaints of chest pain since yesterday.  Pt admitted with chest pain and substernal chest pain radiating to back and CAP.   Pr sleeping soundly at time of visit. Nutrition-Focused physical exam completed. Findings are no fat depletion, no muscle depletion, and no edema.   Case discussed with RN, who reports pt is now NPO, due to possible ileus, per KUB results. RN reports pt was likely eating poorly PTA.  Reviewed wt hx, which reveals UBW is between 320-330#. Suspect wt changes may be related to fluid changes from CHF. Noted 15# wt difference from today's and yesterday's wt, so suspect most recent wt may be inaccurate.   Labs reviewed.   Diet Order:  Diet NPO time specified  Skin:  Reviewed, no issues  Last BM:  PTA  Height:   Ht Readings from Last 1 Encounters:  05/08/16 6\' 1"  (1.854 m)    Weight:   Wt Readings from Last 1 Encounters:  05/09/16 (!) 305 lb 12.8 oz (138.7 kg)    Ideal Body Weight:  83.6 kg  BMI:  Body mass index is 40.35 kg/m.  Estimated Nutritional Needs:   Kcal:  8101-7510  Protein:  125-140 grams  Fluid:  2.3-2.5 L  EDUCATION NEEDS:   No education needs identified at this time  Clark Clowdus A. Jimmye Norman, RD, LDN, CDE Pager: [phone] After hours Pager: [phone]

## 2016-05-10 ENCOUNTER — Encounter (HOSPITAL_COMMUNITY): Payer: Self-pay | Admitting: Emergency Medicine

## 2016-05-10 DIAGNOSIS — R946 Abnormal results of thyroid function studies: Secondary | ICD-10-CM

## 2016-05-10 DIAGNOSIS — R1084 Generalized abdominal pain: Secondary | ICD-10-CM

## 2016-05-10 DIAGNOSIS — I4891 Unspecified atrial fibrillation: Secondary | ICD-10-CM

## 2016-05-10 DIAGNOSIS — R7989 Other specified abnormal findings of blood chemistry: Secondary | ICD-10-CM

## 2016-05-10 DIAGNOSIS — I428 Other cardiomyopathies: Secondary | ICD-10-CM

## 2016-05-10 DIAGNOSIS — R112 Nausea with vomiting, unspecified: Secondary | ICD-10-CM

## 2016-05-10 LAB — HEPATITIS PANEL, ACUTE
HCV Ab: <0.1 s/co ratio (ref 0.0–0.9)
Hep A IgM: NEGATIVE
Hep B C IgM: NEGATIVE
Hepatitis B Surface Ag: NEGATIVE

## 2016-05-10 LAB — LEGIONELLA PNEUMOPHILA SEROGP 1 UR AG: L. PNEUMOPHILA SEROGP 1 UR AG: NEGATIVE

## 2016-05-10 MED ORDER — MAGNESIUM CITRATE PO SOLN
1.0000 | Freq: Once | ORAL | Status: AC
  Administered 2016-05-10: 1 via ORAL
  Filled 2016-05-10: qty 296

## 2016-05-10 MED ORDER — CARVEDILOL 6.25 MG PO TABS
6.2500 mg | ORAL_TABLET | Freq: Two times a day (BID) | ORAL | Status: DC
  Administered 2016-05-10 – 2016-05-12 (×4): 6.25 mg via ORAL
  Filled 2016-05-10 (×4): qty 1

## 2016-05-10 MED ORDER — BISACODYL 10 MG RE SUPP
10.0000 mg | Freq: Every day | RECTAL | Status: DC | PRN
  Administered 2016-05-10: 10 mg via RECTAL
  Filled 2016-05-10: qty 1

## 2016-05-10 NOTE — Progress Notes (Signed)
                  Pt arrived form 4N via w/c.  A&0x4.  Oriented to room . Call bell at reach.  Instructed to call for assistance. [place]ba;ized understanding.  Karie Kirks, Therapist, sports.

## 2016-05-10 NOTE — Consult Note (Signed)
CARDIOLOGY CONSULT NOTE   Patient ID: Gary Kirk MRN: [id number] DOB/AGE: December 04, 1965 51 y.o.  Admit date: 05/08/2016  Requesting Physician: Dr. Wendee Beavers, [facility] Primary Physician:   No PCP Per Patient Primary Cardiologist:   Dr. Marlou Porch Reason for Consultation:   Atrial fibrillation controlled on Amiodarone but has prolonged Q-T interval  HPI: Gary Kirk is a 51 y.o. male with a history of  Left heart catheterization (10 / 7 / 2015) no CAD w/ nonischemic cardiomyopathy; chronic atrial fibrillation ( unclear paroxysmal vs long standing persistent?) s/p AV nodal ablation for afib at [facility], chronic systolic heart failure s/p bi-V ICD placement on 10/31/2013 [facility] ([facility]), medtronic ICD, prior VT/ICD discharge (on amiodarone), hx of cocaine use- no use of illicit drugs since October 2015. Most recent echo done in 09/2014 and EF 28%.  He was recently diagnosed wiuth multifocal pneumonia and prescribed Z-pack at [facility] (05/04/16).  During this visit he had a negative CT scan of the chest, abdomen and pelvis.  He presented to the emergency department again on 05/08/2016 for chest pain described as sharp, substernal and radiating into his back with associated with N/V/cough, anorexia, abdominal pain, constipation, and diaphoresis for the last week. The symptoms wax and wane. His EKG was nonischemic EKG with a.fib with RVR 80-117, negative RUQ abd [place] ordered because of elevated LFTs, Troponin 0.0. He was given a dose of Diltiazem for his elevated heart rate. He has since been diagnosed with ileus which was being managed with bowel rest.  Since his admission he has had 3 negative Troponins and medicine feels that his chest pain was likely due to ileus and pneumonia- he was NPO but now advanced to clear liquid diet. Presently diaphoretic and heart rate is 110-120's, internal medicine has held amiodarone due to Q-T prolongation and we have been asked to weigh in.      Past Medical  History:  Diagnosis Date  . A-fib ([place])   . AICD (automatic cardioverter/defibrillator) present   . CHF (congestive heart failure) ([place])   . COPD (chronic obstructive pulmonary disease) ([place])   . Hypertension   . Presence of permanent cardiac pacemaker      Past Surgical History:  Procedure Laterality Date  . CARDIAC DEFIBRILLATOR PLACEMENT      Allergies  Allergen Reactions  . Tramadol Shortness Of Breath    And, flu-like symptoms  . Abilify [Aripiprazole] Other (See Comments)    Had negative side effects (on him)  . Motrin [Ibuprofen] Other (See Comments)    Spits up blood    I have reviewed the patient's current medications . apixaban  5 mg Oral BID  . carvedilol  6.25 mg Oral BID WC  . cefTRIAXone (ROCEPHIN)  IV  1 g Intravenous Q24H  . levothyroxine  75 mcg Oral QAC breakfast   . dextrose 5 % and 0.45% NaCl 100 mL/hr at 05/10/16 0539   acetaminophen, bisacodyl, fentaNYL (SUBLIMAZE) injection, ipratropium-albuterol, nitroGLYCERIN, ondansetron (ZOFRAN) IV  Prior to Admission medications   Medication Sig Start Date End Date Taking? Authorizing Provider  amiodarone (PACERONE) 400 MG tablet Take 1 tablet (400 mg total) by mouth daily. 06/02/15  Yes Kerrie Buffalo, NP  apixaban (ELIQUIS) 5 MG TABS tablet Take 1 tablet (5 mg total) by mouth 2 (two) times daily. 06/02/15  Yes Kerrie Buffalo, NP  azithromycin (ZITHROMAX) 250 MG tablet Take 1 tablet (250 mg total) by mouth daily. Take first 2 tablets together, then 1 every  day until finished. 05/04/16  Yes [place], PA-C  levothyroxine (SYNTHROID, LEVOTHROID) 75 MCG tablet Take 1 tablet (75 mcg total) by mouth daily before breakfast. 06/02/15  Yes Kerrie Buffalo, NP  NITROSTAT 0.4 MG SL tablet Place 1 tablet (0.4 mg total) under the tongue every five (5) minutes as needed for chest pain. 03/12/15  Yes Historical Provider, MD  ARIPiprazole (ABILIFY) 5 MG tablet Take 1 tablet (5 mg total) by mouth daily. Patient not taking: Reported  on 05/08/2016 06/02/15   Kerrie Buffalo, NP  carvedilol (COREG) 25 MG tablet Take 1 tablet (25 mg total) by mouth 2 (two) times daily. Patient not taking: Reported on 05/08/2016 06/02/15   Kerrie Buffalo, NP  FLUoxetine (PROZAC) 10 MG capsule Take 1 capsule (10 mg total) by mouth daily. Patient not taking: Reported on 05/08/2016 06/02/15   Kerrie Buffalo, NP  furosemide (LASIX) 40 MG tablet Take 1 tablet (40 mg total) by mouth daily. Patient not taking: Reported on 05/08/2016 06/02/15   Kerrie Buffalo, NP  potassium chloride SA (K-DUR,KLOR-CON) 20 MEQ tablet Take 1 tablet (20 mEq total) by mouth daily. Patient not taking: Reported on 05/08/2016 06/02/15   Kerrie Buffalo, NP  tiotropium (SPIRIVA HANDIHALER) 18 MCG inhalation capsule Place 1 capsule (18 mcg total) into inhaler and inhale daily as needed (for shortness of breath). Patient not taking: Reported on 05/08/2016 06/02/15   Kerrie Buffalo, NP  traZODone (DESYREL) 100 MG tablet Take 1 tablet (100 mg total) by mouth at bedtime as needed for sleep. Patient not taking: Reported on 05/08/2016 06/02/15   Kerrie Buffalo, NP     Social History   Social History  . Marital status: Legally Separated    Spouse name: N/A  . Number of children: N/A  . Years of education: N/A   Occupational History  . Not on file.   Social History Main Topics  . Smoking status: Former Smoker    Types: Cigarettes    Quit date: 04/29/2014  . Smokeless tobacco: Never Used  . Alcohol use No  . Drug use: No  . Sexual activity: Not on file   Other Topics Concern  . Not on file   Social History Narrative  . No narrative on file    Family Status  Relation Status  . Mother Deceased  . Father Deceased  . Brother    Family History  Problem Relation Age of Onset  . COPD Mother     decsd 2008  . Hypertension Mother   . Atrial fibrillation Mother   . Heart failure Mother   . Cancer Father     unknown cancer, died when patient was 9  . Lung cancer Brother   . Atrial  fibrillation Brother          ROS:  Full 14 point review of systems complete and found to be negative unless listed above.  Physical Exam: Blood pressure (!) 136/91, pulse 91, temperature 97.6 F (36.4 C), temperature source Oral, resp. rate (!) 22, height 6\' 1"  (1.854 m), weight (!) 305 lb 12.8 oz (138.7 kg), SpO2 98 %.   General: Well developed, well nourished, male in no acute distress Head: Eyes PERRLA, No xanthomas.   Normocephalic and atraumatic Lungs: normal effort and rate of breathing. Heart:: irregular rhythm, HR 115. Neck: No carotid bruits. No lymphadenopathy.  Abdomen: Bowel sounds present, abdomen soft without masses or hernias- limited due to body habitus. Mildly tender to palpation. Msk:  No spine or cva tenderness. No weakness,  no joint deformities or effusions. Extremities: No clubbing or cyanosis. No le  edema.  Neuro: Alert and oriented X 3. No focal deficits noted. Psych:  Good affect, responds appropriately Skin: No rashes or lesions noted.     Labs:   Lab Results  Component Value Date   WBC 5.2 05/09/2016   HGB 12.7 (L) 05/09/2016   HCT 37.9 (L) 05/09/2016   MCV 92.7 05/09/2016   PLT 153 05/09/2016    Recent Labs Lab 05/09/16 0348  NA 137  K 3.8  CL 103  CO2 27  BUN 9  CREATININE 1.01  CALCIUM 8.5*  PROT 6.3*  BILITOT 1.6*  ALKPHOS 45  ALT 109*  AST 105*  GLUCOSE 130*  ALBUMIN 3.0*   Magnesium  Date Value Ref Range Status  05/09/2016 1.8 1.7 - 2.4 mg/dL Final    Recent Labs  05/09/16 0135 05/09/16 0348 05/09/16 0711  TROPONINI 0.03* <0.03 <0.03    Recent Labs  05/08/16 2136  TROPIPOC 0.00   Lipase  Date/Time Value Ref Range Status  05/08/2016 09:11 PM 21 11 - 51 U/L Final   TSH  Date/Time Value Ref Range Status  05/09/2016 01:35 AM <0.010 (L) 0.350 - 4.500 uIU/mL Final    Echo   None in system      ECG: HR 90, ventricular paced rhythm, prolonged QTc      Radiology:   Dg Chest Portable 1 View Result  Date: 05/08/2016 Patchy areas of vague infiltration in the lungs likely corresponding to infiltrates seen on previous chest CT. No evidence of progression.  Dg Abd Portable 1v Result Date: 05/09/2016 Mildly increased volume of small bowel gas likely reflects an ileus or gastroenteritis type process. No evidence of obstruction or perforation.   US Abdomen Limited Ruq Result Date: 05/08/2016 Negative right upper quadrant abdominal ultrasound    ASSESSMENT AND PLAN:       Principal Problem:   Chest pain Active Problems:   Prolonged Q-T interval on ECG   Non-ischemic cardiomyopathy (HCC)   CAP (community acquired pneumonia)   Hypothyroidism   Nausea and vomiting   Hypokalemia   Ileus (HCC)  Atrial Fibrillation (persistent vs paroxysmal) with prolonged Q-T interval:  - Recommend Carvedilol 6.25mg  BID for improved rate control. - His TSH is low recommend adding on T3 and T4 and consider adjusting synthroid. - suspect QT prolongation is stable and likely related to underlying LBBB and Ventricular pacing - continue hold Amiodarone for now  Chest Pain:  -Patient has had negative Troponins, no known CAD as of 2015.  -He presents with ileus on bowel rest and slowly titrating to clear liquid diet. He is now CP pain free.   Non-ischemic cardiomyopathy: Continue medical therapy.  Hypokalemia: resolved  Abnormal thyroid function:  - His TSH is low recommend adding on T3 and T4 and consider adjusting synthroid.   SignedJena Gauss 05/10/2016 5:31 PM

## 2016-05-10 NOTE — Progress Notes (Signed)
Pt heart rate 139. Notified by cental monitor.  Pt stated that he had gotten up to go to the bathroom.  Informed by Lalla Brothers, RN from [facility] that pt heart rate goes up when up in the 130's -140.  Pt asymptomatic.  Notified [place] , [place].  Instructed to continue to monitor.  Karie Kirks, Therapist, sports.

## 2016-05-10 NOTE — Progress Notes (Signed)
PROGRESS NOTE    Gary Kirk  IRW:[id number] DOB: 10/25/1965 DOA: 05/08/2016 PCP: No PCP Per Patient  Brief Narrative:   52 y.o. male with medical history significant of HTN, A. fib, CHF, COPD, and s/p AICD; who presents with complaints of chest pain since yesterday. Pain is described as sharp, substernal, and radiates to his back. Pain waxes and wanes in intensity. Denies trying anything to relieve symptoms. Associated symptoms include nausea, vomiting, nonproductive cough, decreased appetite, abdominal pain, constipation, and diaphoresis for the last 7 days.    Assessment & Plan:   Principal Problem:   Chest pain - Troponin within normal limits.  - Most likely due to ileus    Ileus ([place])  - Most likely principle problem  Active Problems:   Prolonged Q-T interval on ECG - Will consult Cardiology as patient has afib controlled with amiodarone but has prolonged Q-T interval.    Non-ischemic cardiomyopathy ([place]) - continue current medical regimen    CAP (community acquired pneumonia) - f/u with cultures - improving on Rocephin    Hypothyroidism - continue synthroid    Nausea and vomiting - resolving, advance to clear liquid diet.    Hypokalemia - resolved   DVT prophylaxis: (Lovenox/Heparin/SCD's/anticoagulated/None (if comfort care) Code Status: Full Family Communication: none at bedside Disposition Plan: pending course   Consultants:   Cardiology   Procedures: None   Antimicrobials: Rocephin   Subjective: Pt has no new complaints. No acute issues overnight.  Objective: Vitals:   05/10/16 0000 05/10/16 0400 05/10/16 0802 05/10/16 1201  BP:   124/80 (!) 146/74  Pulse:   89 88  Resp:   19 19  Temp: 98.1 F (36.7 C) 98.7 F (37.1 C) 98.2 F (36.8 C) 98.1 F (36.7 C)  TempSrc: Oral Oral Oral Oral  SpO2:      Weight:      Height:        Intake/Output Summary (Last 24 hours) at 05/10/16 1305 Last data filed at 05/10/16 1203  Gross per 24  hour  Intake             1885 ml  Output             1675 ml  Net              210 ml   Filed Weights   05/08/16 2110 05/09/16 0407  Weight: (!) 145.2 kg (320 lb) (!) 138.7 kg (305 lb 12.8 oz)    Examination:  General exam: Appears calm and comfortable, in nad. Respiratory system: Clear to auscultation. Respiratory effort normal. Cardiovascular system: IRRR, no rubs Gastrointestinal system: difficult exam due to obesity, + bowel sounds, soft, NT Central nervous system: Alert and oriented. No focal neurological deficits. Extremities: Symmetric 5 x 5 power. Skin: No rashes, lesions or ulcers, on limited exam. Psychiatry: Judgement and insight appear normal. Mood & affect appropriate.     Data Reviewed: I have personally reviewed following labs and imaging studies  CBC:  Recent Labs Lab 05/04/16 0943 05/08/16 2111 05/08/16 2138 05/09/16 0348  WBC 8.0 9.5  --  5.2  NEUTROABS 5.8 6.8  --  4.6  HGB 14.8 13.7 13.9 12.7*  HCT 42.7 40.3 41.0 37.9*  MCV 92.0 91.4  --  92.7  PLT 150 176  --  761   Basic Metabolic Panel:  Recent Labs Lab 05/04/16 0943 05/08/16 2111 05/08/16 2138 05/09/16 0348  NA 136 135 137 137  K 3.5 3.2* 3.2* 3.8  CL 100*  101 100* 103  CO2 26 24  --  27  GLUCOSE 112* 116* 115* 130*  BUN 13 7 10 9   CREATININE 1.10 1.10 1.10 1.01  CALCIUM 9.3 8.9  --  8.5*  MG  --   --   --  1.8   GFR: Estimated Creatinine Clearance: 126.5 mL/min (by C-G formula based on SCr of 1.01 mg/dL). Liver Function Tests:  Recent Labs Lab 05/04/16 0943 05/08/16 2111 05/09/16 0348  AST 78* 124* 105*  ALT 60 121* 109*  ALKPHOS 52 50 45  BILITOT 1.4* 1.6* 1.6*  PROT 7.5 6.9 6.3*  ALBUMIN 3.6 3.3* 3.0*    Recent Labs Lab 05/04/16 0943 05/08/16 2111  LIPASE 19 21   No results for input(s): AMMONIA in the last 168 hours. Coagulation Profile: No results for input(s): INR, PROTIME in the last 168 hours. Cardiac Enzymes:  Recent Labs Lab 05/09/16 0135  05/09/16 0348 05/09/16 0711  TROPONINI 0.03* <0.03 <0.03   BNP (last 3 results) No results for input(s): PROBNP in the last 8760 hours. HbA1C: No results for input(s): HGBA1C in the last 72 hours. CBG: No results for input(s): GLUCAP in the last 168 hours. Lipid Profile: No results for input(s): CHOL, HDL, LDLCALC, TRIG, CHOLHDL, LDLDIRECT in the last 72 hours. Thyroid Function Tests:  Recent Labs  05/09/16 0135  TSH <0.010*   Anemia Panel: No results for input(s): VITAMINB12, FOLATE, FERRITIN, TIBC, IRON, RETICCTPCT in the last 72 hours. Sepsis Labs:  Recent Labs Lab 05/04/16 1048 05/08/16 2138 05/09/16 0116  LATICACIDVEN 1.13 1.49 1.12    Recent Results (from the past 240 hour(s))  Blood culture (routine x 2)     Status: None   Collection Time: 05/04/16 10:39 AM  Result Value Ref Range Status   Specimen Description BLOOD LEFT HAND  Final   Special Requests BOTTLES DRAWN AEROBIC AND ANAEROBIC 4CC EACH  Final   Culture NO GROWTH 5 DAYS  Final   Report Status 05/09/2016 FINAL  Final  Blood culture (routine x 2)     Status: None   Collection Time: 05/04/16 10:43 AM  Result Value Ref Range Status   Specimen Description BLOOD LEFT FOREARM  Final   Special Requests BOTTLES DRAWN AEROBIC AND ANAEROBIC 4CC EACH  Final   Culture NO GROWTH 5 DAYS  Final   Report Status 05/09/2016 FINAL  Final  Blood culture (routine x 2)     Status: None (Preliminary result)   Collection Time: 05/08/16 11:28 PM  Result Value Ref Range Status   Specimen Description BLOOD LEFT FOREARM  Final   Special Requests IN PEDIATRIC BOTTLE 1CC  Final   Culture NO GROWTH 1 DAY  Final   Report Status PENDING  Incomplete  Blood culture (routine x 2)     Status: None (Preliminary result)   Collection Time: 05/09/16  1:07 AM  Result Value Ref Range Status   Specimen Description BLOOD RIGHT HAND  Final   Special Requests AEROBIC BOTTLE ONLY 5ML  Final   Culture NO GROWTH 1 DAY  Final   Report Status  PENDING  Incomplete  MRSA PCR Screening     Status: None   Collection Time: 05/09/16  2:15 AM  Result Value Ref Range Status   MRSA by PCR NEGATIVE NEGATIVE Final    Comment:        The GeneXpert MRSA Assay (FDA approved for NASAL specimens only), is one component of a comprehensive MRSA colonization surveillance program. It is not intended  to diagnose MRSA infection nor to guide or monitor treatment for MRSA infections.       Radiology Studies: Dg Chest Portable 1 View  Result Date: 05/08/2016 CLINICAL DATA:  Chest pain and shortness of breath since Tuesday of last week. History of pacemaker, COPD, congestive heart failure, atrial fibrillation, hypertension. Previous smoker. EXAM: PORTABLE CHEST 1 VIEW COMPARISON:  CT chest 05/04/2016.  Chest 05/04/2016. FINDINGS: Borderline heart size with normal pulmonary vascularity. Cardiac pacemaker. Mediastinal contours appear intact. Suggestion of vague opacities demonstrated in the right middle lung and both lower lungs probably corresponding to infiltrates better seen on previous CT. No progression. No blunting of costophrenic angles. No pneumothorax. IMPRESSION: Patchy areas of vague infiltration in the lungs likely corresponding to infiltrates seen on previous chest CT. No evidence of progression. Electronically Signed   By: Lucienne Capers M.D.   On: 05/08/2016 21:35   Dg Abd Portable 1v  Result Date: 05/09/2016 CLINICAL DATA:  Abdominal pain, nausea, anorexia for the past 8 days. EXAM: PORTABLE ABDOMEN - 1 VIEW COMPARISON:  Abdominal and pelvic CT scan and abdominal ultrasound of March 8th and May 08, 2016 respectively. FINDINGS: There is a small amount of gas within the stomach. There is a moderate amount of gas within minimally distended small bowel loops. There is gas and stool in the colon and rectum. No free extraluminal gas collections are observed. IMPRESSION: Mildly increased volume of small bowel gas likely reflects an ileus or  gastroenteritis type process. No evidence of obstruction or perforation. Electronically Signed   By: David  Martinique M.D.   On: 05/09/2016 08:58   US Abdomen Limited Ruq  Result Date: 05/08/2016 CLINICAL DATA:  Right upper quadrant pain for 2 weeks EXAM: US ABDOMEN LIMITED - RIGHT UPPER QUADRANT COMPARISON:  05/04/2016 CT FINDINGS: Gallbladder: No gallstones or wall thickening visualized. No sonographic Murphy sign noted by sonographer. Common bile duct: Diameter: Normal at 3.3 mm Liver: No focal lesion identified. Within normal limits in parenchymal echogenicity. IMPRESSION: Negative right upper quadrant abdominal ultrasound Electronically Signed   By: Donavan Foil M.D.   On: 05/08/2016 23:26    Scheduled Meds: . apixaban  5 mg Oral BID  . cefTRIAXone (ROCEPHIN)  IV  1 g Intravenous Q24H  . levothyroxine  75 mcg Oral QAC breakfast   Continuous Infusions: . dextrose 5 % and 0.45% NaCl 100 mL/hr at 05/10/16 0539     LOS: 1 day    Time spent: > 35 min  Velvet Bathe, MD [facility] Pager [phone]  If 7PM-7AM, please contact night-coverage [email or web address] Password [email or web address] 05/10/2016, 1:05 PM

## 2016-05-11 DIAGNOSIS — R079 Chest pain, unspecified: Secondary | ICD-10-CM

## 2016-05-11 DIAGNOSIS — I5043 Acute on chronic combined systolic (congestive) and diastolic (congestive) heart failure: Secondary | ICD-10-CM

## 2016-05-11 LAB — T4, FREE: FREE T4: 5.13 ng/dL — AB (ref 0.61–1.12)

## 2016-05-11 MED ORDER — ZOLPIDEM TARTRATE 5 MG PO TABS
5.0000 mg | ORAL_TABLET | Freq: Once | ORAL | Status: AC
Start: 2016-05-11 — End: 2016-05-11
  Administered 2016-05-11: 5 mg via ORAL
  Filled 2016-05-11: qty 1

## 2016-05-11 MED ORDER — FUROSEMIDE 40 MG PO TABS
40.0000 mg | ORAL_TABLET | Freq: Every day | ORAL | Status: DC
  Administered 2016-05-12: 40 mg via ORAL
  Filled 2016-05-11: qty 1

## 2016-05-11 MED ORDER — LEVOTHYROXINE SODIUM 25 MCG PO TABS
25.0000 ug | ORAL_TABLET | Freq: Every day | ORAL | Status: DC
  Filled 2016-05-11: qty 1

## 2016-05-11 MED ORDER — LEVOTHYROXINE SODIUM 25 MCG PO TABS: 25.0000 ug | ORAL_TABLET | Freq: Every day | ORAL | Status: DC

## 2016-05-11 NOTE — Progress Notes (Signed)
PROGRESS NOTE    Gary Kirk  QRF:[id number] DOB: [birth year] DOA: 05/08/2016 PCP: No PCP Per Patient  Brief Narrative:   51 y.o. male with medical history significant of HTN, A. fib, CHF, COPD, and s/p AICD; who presents with complaints of chest pain since yesterday. Pain is described as sharp, substernal, and radiates to his back. Pain waxes and wanes in intensity. Denies trying anything to relieve symptoms. Associated symptoms include nausea, vomiting, nonproductive cough, decreased appetite, abdominal pain, constipation, and diaphoresis for the last 7 days.    Assessment & Plan:   Principal Problem:   Chest pain - Troponin within normal limits.  - Most likely due to ileus    Ileus ([place])  -  principle problem, unable to use reglan due to QTc. Recommended increasing ambulation. Patient verbalizes agreement and understanding. Kept down two bites today he said. But still having nausea.  - place order to have patient ambulated q 4 hours (should increase peristalsis).  Active Problems:   Prolonged Q-T interval on ECG - Will consult Cardiology as patient has afib controlled with amiodarone but has prolonged Q-T interval.    Non-ischemic cardiomyopathy ([place]) - continue current medical regimen    CAP (community acquired pneumonia) - f/u with cultures - improving on Rocephin    Hypothyroidism - iatrogenic hyperthyroidism. Will hold synthroid for 1 week given half life of thyroid replacement medication.    Nausea and vomiting - still a problem, clear liquid diet.    Hypokalemia - resolved   DVT prophylaxis: (Lovenox/Heparin/SCD's/anticoagulated/None (if comfort care) Code Status: Full Family Communication: none at bedside Disposition Plan: pending course   Consultants:   Cardiology   Procedures: None   Antimicrobials: Rocephin   Subjective: Pt has no new complaints. No acute issues overnight.  Objective: Vitals:   05/11/16 0003 05/11/16 0627 05/11/16 1023  05/11/16 1139  BP: (!) 124/52 117/74 (!) 119/56 (!) 107/50  Pulse: 85 90 89 88  Resp: 18 18  18   Temp: 98.5 F (36.9 C) 98.3 F (36.8 C)  97.4 F (36.3 C)  TempSrc: Oral Oral  Oral  SpO2: 96% 97%  97%  Weight:  (!) 137.2 kg (302 lb 6.4 oz)    Height:        Intake/Output Summary (Last 24 hours) at 05/11/16 1231 Last data filed at 05/11/16 1126  Gross per 24 hour  Intake          2598.33 ml  Output             1325 ml  Net          1273.33 ml   Filed Weights   05/09/16 0407 05/10/16 1839 05/11/16 0627  Weight: (!) 138.7 kg (305 lb 12.8 oz) (!) 138.1 kg (304 lb 6.4 oz) (!) 137.2 kg (302 lb 6.4 oz)    Examination:  General exam: Appears calm and comfortable, in nad. Respiratory system: Clear to auscultation. Respiratory effort normal. Cardiovascular system: IRRR, no rubs Gastrointestinal system: difficult exam due to obesity, + bowel sounds, soft, NT Central nervous system: Alert and oriented. No focal neurological deficits. Extremities: Symmetric 5 x 5 power. Skin: No rashes, lesions or ulcers, on limited exam. Psychiatry: Judgement and insight appear normal. Mood & affect appropriate.     Data Reviewed: I have personally reviewed following labs and imaging studies  CBC:  Recent Labs Lab 05/08/16 2111 05/08/16 2138 05/09/16 0348  WBC 9.5  --  5.2  NEUTROABS 6.8  --  4.6  HGB 13.7  13.9 12.7*  HCT 40.3 41.0 37.9*  MCV 91.4  --  92.7  PLT 176  --  924   Basic Metabolic Panel:  Recent Labs Lab 05/08/16 2111 05/08/16 2138 05/09/16 0348  NA 135 137 137  K 3.2* 3.2* 3.8  CL 101 100* 103  CO2 24  --  27  GLUCOSE 116* 115* 130*  BUN 7 10 9   CREATININE 1.10 1.10 1.01  CALCIUM 8.9  --  8.5*  MG  --   --  1.8   GFR: Estimated Creatinine Clearance: 124.1 mL/min (by C-G formula based on SCr of 1.01 mg/dL). Liver Function Tests:  Recent Labs Lab 05/08/16 2111 05/09/16 0348  AST 124* 105*  ALT 121* 109*  ALKPHOS 50 45  BILITOT 1.6* 1.6*  PROT 6.9 6.3*   ALBUMIN 3.3* 3.0*    Recent Labs Lab 05/08/16 2111  LIPASE 21   No results for input(s): AMMONIA in the last 168 hours. Coagulation Profile: No results for input(s): INR, PROTIME in the last 168 hours. Cardiac Enzymes:  Recent Labs Lab 05/09/16 0135 05/09/16 0348 05/09/16 0711  TROPONINI 0.03* <0.03 <0.03   BNP (last 3 results) No results for input(s): PROBNP in the last 8760 hours. HbA1C: No results for input(s): HGBA1C in the last 72 hours. CBG: No results for input(s): GLUCAP in the last 168 hours. Lipid Profile: No results for input(s): CHOL, HDL, LDLCALC, TRIG, CHOLHDL, LDLDIRECT in the last 72 hours. Thyroid Function Tests:  Recent Labs  05/09/16 0135 05/11/16 0356  TSH <0.010*  --   FREET4  --  5.13*   Anemia Panel: No results for input(s): VITAMINB12, FOLATE, FERRITIN, TIBC, IRON, RETICCTPCT in the last 72 hours. Sepsis Labs:  Recent Labs Lab 05/08/16 2138 05/09/16 0116  LATICACIDVEN 1.49 1.12    Recent Results (from the past 240 hour(s))  Blood culture (routine x 2)     Status: None   Collection Time: 05/04/16 10:39 AM  Result Value Ref Range Status   Specimen Description BLOOD LEFT HAND  Final   Special Requests BOTTLES DRAWN AEROBIC AND ANAEROBIC 4CC EACH  Final   Culture NO GROWTH 5 DAYS  Final   Report Status 05/09/2016 FINAL  Final  Blood culture (routine x 2)     Status: None   Collection Time: 05/04/16 10:43 AM  Result Value Ref Range Status   Specimen Description BLOOD LEFT FOREARM  Final   Special Requests BOTTLES DRAWN AEROBIC AND ANAEROBIC 4CC EACH  Final   Culture NO GROWTH 5 DAYS  Final   Report Status 05/09/2016 FINAL  Final  Blood culture (routine x 2)     Status: None (Preliminary result)   Collection Time: 05/08/16 11:28 PM  Result Value Ref Range Status   Specimen Description BLOOD LEFT FOREARM  Final   Special Requests IN PEDIATRIC BOTTLE 1CC  Final   Culture NO GROWTH 1 DAY  Final   Report Status PENDING  Incomplete    Blood culture (routine x 2)     Status: None (Preliminary result)   Collection Time: 05/09/16  1:07 AM  Result Value Ref Range Status   Specimen Description BLOOD RIGHT HAND  Final   Special Requests AEROBIC BOTTLE ONLY 5ML  Final   Culture NO GROWTH 1 DAY  Final   Report Status PENDING  Incomplete  MRSA PCR Screening     Status: None   Collection Time: 05/09/16  2:15 AM  Result Value Ref Range Status   MRSA by PCR  NEGATIVE NEGATIVE Final    Comment:        The GeneXpert MRSA Assay (FDA approved for NASAL specimens only), is one component of a comprehensive MRSA colonization surveillance program. It is not intended to diagnose MRSA infection nor to guide or monitor treatment for MRSA infections.       Radiology Studies: No results found.  Scheduled Meds: . apixaban  5 mg Oral BID  . carvedilol  6.25 mg Oral BID WC  . cefTRIAXone (ROCEPHIN)  IV  1 g Intravenous Q24H  . [START ON 05/18/2016] levothyroxine  25 mcg Oral QAC breakfast   Continuous Infusions: . dextrose 5 % and 0.45% NaCl 100 mL/hr at 05/11/16 0230     LOS: 2 days    Time spent: > 35 min  Velvet Bathe, MD [facility] Pager [phone]  If 7PM-7AM, please contact night-coverage [email or web address] Password [facility] 05/11/2016, 12:31 PM

## 2016-05-11 NOTE — Progress Notes (Signed)
Progress Note  Patient Name: Gary Kirk Date of Encounter: 05/11/2016  Primary Cardiologist: Dr. Marlou Porch  Subjective   Feels better today - breathing has improved. Rate-control improved with addition of carvedilol.  Inpatient Medications    Scheduled Meds: . apixaban  5 mg Oral BID  . carvedilol  6.25 mg Oral BID WC  . cefTRIAXone (ROCEPHIN)  IV  1 g Intravenous Q24H  . [START ON 05/18/2016] levothyroxine  25 mcg Oral QAC breakfast   Continuous Infusions: . dextrose 5 % and 0.45% NaCl 100 mL/hr at 05/11/16 0230   PRN Meds: acetaminophen, bisacodyl, fentaNYL (SUBLIMAZE) injection, ipratropium-albuterol, nitroGLYCERIN, ondansetron (ZOFRAN) IV   Vital Signs    Vitals:   05/11/16 0003 05/11/16 0627 05/11/16 1023 05/11/16 1139  BP: (!) 124/52 117/74 (!) 119/56 (!) 107/50  Pulse: 85 90 89 88  Resp: 18 18  18   Temp: 98.5 F (36.9 C) 98.3 F (36.8 C)  97.4 F (36.3 C)  TempSrc: Oral Oral  Oral  SpO2: 96% 97%  97%  Weight:  (!) 302 lb 6.4 oz (137.2 kg)    Height:        Intake/Output Summary (Last 24 hours) at 05/11/16 1344 Last data filed at 05/11/16 1316  Gross per 24 hour  Intake          2718.33 ml  Output             1825 ml  Net           893.33 ml   Filed Weights   05/09/16 0407 05/10/16 1839 05/11/16 0627  Weight: (!) 305 lb 12.8 oz (138.7 kg) (!) 304 lb 6.4 oz (138.1 kg) (!) 302 lb 6.4 oz (137.2 kg)    Telemetry    Ventricular paced rhythm, afib, HR 80's - Personally Reviewed  ECG    None since yesterday   Physical Exam   GEN: No acute distress. Morbidly obese Neck: No JVD Cardiac:irregular irregular, no murmurs, rubs, or gallops.  Respiratory:diminished breath sounds at bases  GI: Soft, nontender, non-distended  MS: No edema; No deformity. Neuro:  Nonfocal  Psych: Normal affect  Ext: trace edema  Labs    Chemistry Recent Labs Lab 05/08/16 2111 05/08/16 2138 05/09/16 0348  NA 135 137 137  K 3.2* 3.2* 3.8  CL 101 100* 103    CO2 24  --  27  GLUCOSE 116* 115* 130*  BUN 7 10 9   CREATININE 1.10 1.10 1.01  CALCIUM 8.9  --  8.5*  PROT 6.9  --  6.3*  ALBUMIN 3.3*  --  3.0*  AST 124*  --  105*  ALT 121*  --  109*  ALKPHOS 50  --  45  BILITOT 1.6*  --  1.6*  GFRNONAA >60  --  >60  GFRAA >60  --  >60  ANIONGAP 10  --  7     Hematology Recent Labs Lab 05/08/16 2111 05/08/16 2138 05/09/16 0348  WBC 9.5  --  5.2  RBC 4.41  --  4.09*  HGB 13.7 13.9 12.7*  HCT 40.3 41.0 37.9*  MCV 91.4  --  92.7  MCH 31.1  --  31.1  MCHC 34.0  --  33.5  RDW 12.4  --  12.7  PLT 176  --  153   Cardiac Enzymes Recent Labs Lab 05/09/16 0135 05/09/16 0348 05/09/16 0711  TROPONINI 0.03* <0.03 <0.03    Recent Labs Lab 05/08/16 2136  TROPIPOC 0.00    BNP  Recent Labs Lab 05/08/16 2111  BNP 63.3     Radiology    No results found.  Cardiac Studies   none  Patient Profile     Gary Kirk 51 y.o. and examined patient of Dr. Marlou Porch with extensive cardiac history and NICM with EF around 35% s/p BI-V AICD, recurrent atrial fibrillation, prior VT/AICD dischage (on amiodarone), presented with what is suspected to be ileus - he had recent hospitalization for multifocal pneumonia. He reports he has lost about 15 lbs in the hospital. Now on clear liquids. HR remains elevated in a-fib with RVR. Amiodarone was stopped d/t QT prolongation - he has underlying LBBB and QTc on his paced rhythm represents wide QRS, being greater than 500 msec. He reports compliance with Eliquis for anticoagulation.    Assessment & Plan    Atrial Fibrillation (persistent vs paroxysmal) with prolonged Q-T interval:  - currently rate controlled. - Recommend Carvedilol 6.25mg  BID for improved rate control. - His TSH low, Synthroid being held by medicine - suspect QT prolongation is stable and likely related to underlying LBBB and Ventricular pacing - continue hold Amiodarone for now  Chest Pain:  -Patient has had negative  Troponins, no known CAD as of 2015.  -He presents with ileus titrating to clear liquid diet. CP pain free.   Non-ischemic cardiomyopathy: Continue medical therapy.  Hypokalemia: resolved  Abnormal thyroid function:  This could also contribute to his a-fib with RVR, medicines plan is to hold synthroid for 1 week   Signed, Linus Mako, PA-C  05/11/2016, 1:44 PM    Pt. Seen and examined. Agree with the Resident/NP/PA-C note as written. Rate control improved overnight. Lost another pound with diuresis. Amiodarone being held for QTC prolongation - check repeat 12 lead EKG tomorrow. Continue coreg and restart home lasix tomorrow am since tolerating diet.  Pixie Casino, MD, [facility] Attending Cardiologist [place]

## 2016-05-11 NOTE — Progress Notes (Signed)
Patient complained of nausea and stomach pain last night, no vomiting.

## 2016-05-12 DIAGNOSIS — E876 Hypokalemia: Secondary | ICD-10-CM

## 2016-05-12 DIAGNOSIS — R0789 Other chest pain: Secondary | ICD-10-CM

## 2016-05-12 DIAGNOSIS — E039 Hypothyroidism, unspecified: Secondary | ICD-10-CM

## 2016-05-12 LAB — T3, FREE: T3, Free: 6.7 pg/mL — ABNORMAL HIGH (ref 2.0–4.4)

## 2016-05-12 LAB — T3: T3, Total: 171 ng/dL (ref 71–180)

## 2016-05-12 MED ORDER — CARVEDILOL 6.25 MG PO TABS: 6.2500 mg | ORAL_TABLET | Freq: Two times a day (BID) | ORAL | 0 refills | Status: DC

## 2016-05-12 MED ORDER — LEVOTHYROXINE SODIUM 25 MCG PO TABS: 25.0000 ug | ORAL_TABLET | Freq: Every day | ORAL | 0 refills | Status: DC

## 2016-05-12 NOTE — Progress Notes (Signed)
Patient has been tolerating his clear liquid diet, wants to "eat some real food", no complaints of n/v, may be appropriate to advance patients diet.

## 2016-05-12 NOTE — Progress Notes (Signed)
According to day shift nurse, patients IV has infiltrated, patient says it is irritating him. Removed IV, help pressure.

## 2016-05-12 NOTE — Progress Notes (Signed)
Reviewed am EKG - paced with underlying LBBB, QTc is >580 msec, but this may not be accurate. He is off amiodarone and on coreg. Plan to resume home po lasix and d/c today per hospitalist service. Follow-up with Dr. Marlou Porch.  Pixie Casino, MD, [facility] Attending Cardiologist [place]

## 2016-05-12 NOTE — Progress Notes (Signed)
Patient requesting something for sleep, paged on call [facility].

## 2016-05-12 NOTE — Progress Notes (Signed)
Patient says he left his pants on 4N before he transferred to this unit (3E) and he has been looking for them. Says he is going home tomorrow no matter what the physicians say in the morning so he needs his pants.

## 2016-05-12 NOTE — Discharge Instructions (Signed)
Information on my medicine - ELIQUIS® (apixaban) ° °This medication education was reviewed with me or my healthcare representative as part of my discharge preparation.   °Why was Eliquis® prescribed for you? °Eliquis® was prescribed for you to reduce the risk of forming blood clots that can cause a stroke if you have a medical condition called atrial fibrillation (a type of irregular heartbeat) OR to reduce the risk of a blood clots forming after orthopedic surgery. ° °What do You need to know about Eliquis® ? °Take your Eliquis® 5mg TWICE DAILY - one tablet in the morning and one tablet in the evening with or without food.  It would be best to take the doses about the same time each day. ° °If you have difficulty swallowing the tablet whole please discuss with your pharmacist how to take the medication safely. ° °Take Eliquis® exactly as prescribed by your doctor and DO NOT stop taking Eliquis® without talking to the doctor who prescribed the medication.  Stopping may increase your risk of developing a new clot or stroke.  Refill your prescription before you run out. ° °After discharge, you should have regular check-up appointments with your healthcare provider that is prescribing your Eliquis®.  In the future your dose may need to be changed if your kidney function or weight changes by a significant amount or as you get older. ° °What do you do if you miss a dose? °If you miss a dose, take it as soon as you remember on the same day and resume taking twice daily.  Do not take more than one dose of ELIQUIS at the same time. ° °Important Safety Information °A possible side effect of Eliquis® is bleeding. You should call your healthcare provider right away if you experience any of the following: °? Bleeding from an injury or your nose that does not stop. °? Unusual colored urine (red or dark brown) or unusual colored stools (red or black). °? Unusual bruising for unknown reasons. °? A serious fall or if you hit your  head (even if there is no bleeding). ° °Some medicines may interact with Eliquis® and might increase your risk of bleeding or clotting while on Eliquis®. To help avoid this, consult your healthcare provider or pharmacist prior to using any new prescription or non-prescription medications, including herbals, vitamins, non-steroidal anti-inflammatory drugs (NSAIDs) and supplements. ° °This website has more information on Eliquis® (apixaban): [email or web address].com. ° ° ° °

## 2016-05-12 NOTE — Discharge Summary (Signed)
Physician Discharge Summary  Gary Kirk ZOX:[id number] DOB: 1965-07-26 DOA: 05/08/2016  PCP: No PCP Per Patient  Admit date: 05/08/2016 Discharge date: 05/12/2016  Time spent: minutes  Recommendations for Outpatient Follow-up:  1. Ensure patient f/u with cardiology 2. Monitor S creatinine and K levels since patient is on lasix.   Discharge Diagnoses:  Principal Problem:   Chest pain Active Problems:   Prolonged Q-T interval on ECG   Non-ischemic cardiomyopathy (HCC)   Multifocal pneumonia   Hypothyroidism   Nausea and vomiting   Hypokalemia   Ileus (HCC)   Atrial fibrillation with rapid ventricular response (HCC)   Low TSH level   Acute on chronic combined systolic and diastolic CHF (congestive heart failure) ([place])   Discharge Condition: stable  Diet recommendation: clear liquid advance to heart healthy  Filed Weights   05/10/16 1839 05/11/16 0627 05/12/16 0601  Weight: (!) 138.1 kg (304 lb 6.4 oz) (!) 137.2 kg (302 lb 6.4 oz) (!) 136.6 kg (301 lb 1.6 oz)    History of present illness:  51 y.o.malewith medical history significant of HTN, A. fib, CHF, COPD, and s/p AICD; who presents with complaints of chest pain since yesterday.Pain is described as sharp, substernal, and radiates to his back. Pain waxesand wanesin intensity.Denies trying anything to relieve symptoms.Associated symptoms include nausea, vomiting, nonproductive cough, decreased appetite, abdominal pain, constipation, and diaphoresis for the last 7 days.   Hospital Course:  Principal Problem:   Chest pain - Troponin within normal limits.  - Most likely due to ileus    Ileus ([place])  -  Resolving patient is able to tolerate by mouth intake. He is requesting discharge home.  Active Problems:   Prolonged Q-T interval on ECG - Discontinued amiodarone and started carvedilol.    Non-ischemic cardiomyopathy ([place]) - continue medical regimen listed below. REcommended patient follow-up with  cardiology    CAP (community acquired pneumonia) -All symptoms resolved. Patient breathing comfortably on room air. Will discontinue antibiotic regimen as patient reports he does not want to take any any how    Hypothyroidism - iatrogenic hyperthyroidism. Will hold synthroid for 1 week and then recommending starting at lower dose. Prescription provided on discharge    Nausea and vomiting - Resolved    Hypokalemia - resolved, 3.8 on last check. Magnesium levels within normal limits on last check   Procedures:  None  Consultations:  Cardiology  Discharge Exam: Vitals:   05/12/16 0751 05/12/16 1156  BP: (!) 118/59 128/60  Pulse: 89 88  Resp: 16 16  Temp: 97.9 F (36.6 C) 97.9 F (36.6 C)    General: Patient in no acute distress, alert and awake Cardiovascular: Regular rate and rhythm, no rubs Respiratory: No increased work of breathing, no wheezes  Discharge Instructions   Discharge Instructions    Call MD for:  difficulty breathing, headache or visual disturbances    Complete by:  As directed    Call MD for:  extreme fatigue    Complete by:  As directed    Call MD for:  temperature >100.4    Complete by:  As directed    Diet - low sodium heart healthy    Complete by:  As directed    Discharge instructions    Complete by:  As directed    Please follow up with your cardiologist in 1-2 weeks after hospital discharge.   Increase activity slowly    Complete by:  As directed      Current Discharge Medication List  CONTINUE these medications which have CHANGED   Details  carvedilol (COREG) 6.25 MG tablet Take 1 tablet (6.25 mg total) by mouth 2 (two) times daily with a meal. Qty: 60 tablet, Refills: 0    levothyroxine (SYNTHROID, LEVOTHROID) 25 MCG tablet Take 1 tablet (25 mcg total) by mouth daily before breakfast. Qty: 30 tablet, Refills: 0      CONTINUE these medications which have NOT CHANGED   Details  apixaban (ELIQUIS) 5 MG TABS tablet Take 1  tablet (5 mg total) by mouth 2 (two) times daily. Qty: 60 tablet, Refills: 0    NITROSTAT 0.4 MG SL tablet Place 1 tablet (0.4 mg total) under the tongue every five (5) minutes as needed for chest pain. Refills: 2    furosemide (LASIX) 40 MG tablet Take 1 tablet (40 mg total) by mouth daily. Qty: 30 tablet, Refills: 0    potassium chloride SA (K-DUR,KLOR-CON) 20 MEQ tablet Take 1 tablet (20 mEq total) by mouth daily. Qty: 30 tablet, Refills: 0    tiotropium (SPIRIVA HANDIHALER) 18 MCG inhalation capsule Place 1 capsule (18 mcg total) into inhaler and inhale daily as needed (for shortness of breath). Qty: 30 capsule, Refills: 0      STOP taking these medications     amiodarone (PACERONE) 400 MG tablet      azithromycin (ZITHROMAX) 250 MG tablet      ARIPiprazole (ABILIFY) 5 MG tablet      FLUoxetine (PROZAC) 10 MG capsule      traZODone (DESYREL) 100 MG tablet        Allergies  Allergen Reactions  . Tramadol Shortness Of Breath    And, flu-like symptoms  . Abilify [Aripiprazole] Other (See Comments)    Had negative side effects (on him)  . Motrin [Ibuprofen] Other (See Comments)    Spits up blood      The results of significant diagnostics from this hospitalization (including imaging, microbiology, ancillary and laboratory) are listed below for reference.    Significant Diagnostic Studies: Dg Chest 2 View  Result Date: 05/04/2016 CLINICAL DATA:  Increasing shortness of breath, vomiting for 4 days EXAM: CHEST  2 VIEW COMPARISON:  04/29/2015 FINDINGS: Left AICD remains in place, unchanged. Heart is borderline in size. Lungs are clear. No effusions or acute bony abnormality. IMPRESSION: Borderline heart size.  No active disease. Electronically Signed   By: Rolm Baptise M.D.   On: 05/04/2016 11:04   Ct Chest Wo Contrast  Result Date: 05/04/2016 CLINICAL DATA:  Shortness of breath and cough with question pneumonia seen on abdominal CT earlier today. EXAM: CT CHEST WITHOUT  CONTRAST TECHNIQUE: Multidetector CT imaging of the chest was performed following the standard protocol without IV contrast. COMPARISON:  Chest x-ray and abdomen CT earlier today. FINDINGS: Cardiovascular: Heart size normal. No pericardial effusion. Coronary artery calcification is noted. Left permanent pacemaker noted. Mediastinum/Nodes: Scattered mediastinal lymph nodes evident without lymphadenopathy. No evidence for gross hilar lymphadenopathy although assessment is limited by the lack of intravenous contrast on today's study. The esophagus has normal imaging features. There is no axillary lymphadenopathy. Lungs/Pleura: Scattered areas of ground-glass attenuation are identified in the lungs bilaterally, right greater than left. Peripheral predominance noted. These changes are associated with diffuse bronchial wall thickening bilaterally. No dense focal airspace consolidation. No pulmonary edema or pleural effusion. Upper Abdomen: Unremarkable. Musculoskeletal: Bone windows reveal no worrisome lytic or sclerotic osseous lesions. IMPRESSION: 1. Scattered areas of ground-glass attenuation with a right-sided and peripheral predominance. Features are associated with  diffuse bronchial wall thickening. Multifocal bronchopneumonia or atypical infection favored. Cryptogenic pneumonia also a consideration although findings are more asymmetric than typically seen for that etiology. Septic emboli also considered much less likely given the asymmetry. Electronically Signed   By: Misty Stanley M.D.   On: 05/04/2016 13:12   Ct Abdomen Pelvis W Contrast  Result Date: 05/04/2016 CLINICAL DATA:  Nausea, vomiting, diarrhea for 4 days, constipation for 2 weeks, right lower quadrant pain EXAM: CT ABDOMEN AND PELVIS WITH CONTRAST TECHNIQUE: Multidetector CT imaging of the abdomen and pelvis was performed using the standard protocol following bolus administration of intravenous contrast. CONTRAST:  144mL ISOVUE-300 IOPAMIDOL  (ISOVUE-300) INJECTION 61% COMPARISON:  None. FINDINGS: Lower chest: Lung bases shows small patchy infiltrate in right middle lobe suspicious for infection/pneumonitis. Partially visualized cardiac pacemaker leads. Hepatobiliary: Enhanced liver shows no focal abnormality. No calcified gallstones are noted within gallbladder. Pancreas: The enhanced pancreas shows moderate fatty replacement especially in the in the head and body of the pancreas. No focal pancreatic mass. No pancreatic ductal dilatation. No definite evidence of acute pancreatitis or peripancreatic fluid. Spleen: Enhanced spleen with normal appearance. Adrenals/Urinary Tract: No adrenal gland mass. Enhanced kidneys are symmetrical in size. No hydronephrosis or hydroureter. No nephrolithiasis. Delayed renal images shows bilateral renal symmetrical excretion. Bilateral visualized proximal ureter is unremarkable. Stomach/Bowel: No small bowel obstruction. No thickened or dilated small bowel loops. The terminal ileum is unremarkable. No pericecal inflammation. Normal appendix is clearly identified retrocecal position axial image 64. Limited assessment of the colon which is empty collapsed. Only small amount of gas noted in mid sigmoid colon. No evidence of colitis or diverticulitis. Vascular/Lymphatic: Atherosclerotic calcifications of abdominal aorta and iliac arteries. No aortic aneurysm. No adenopathy. Reproductive: Prostate gland and seminal vesicles are unremarkable. The urinary bladder is unremarkable. Other: No inguinal adenopathy. Small nonspecific bilateral inguinal lymph nodes are noted not pathologic by size criteria. There is no ascites or free abdominal air. Musculoskeletal: No destructive bony lesions are noted. Mild degenerative changes lower thoracic spine. Mild degenerative changes bilateral SI joints. IMPRESSION: 1. There is partially visualized patchy infiltrate in right middle lobe anterior and laterally. This is highly suspicious for  atypical infection/ pneumonitis. Clinical correlation is necessary. Follow-up to resolution is recommended. 2. There is moderate fatty replacement of the pancreas especially in the region of body and head of the pancreas. No definite focal pancreatic mass. No pancreatic ductal dilatation. No definite evidence of acute pancreatitis. 3. No calcified gallstones are noted within gallbladder. No nephrolithiasis. No hydronephrosis or hydroureter. 4. Normal appendix. No pericecal inflammation. No small bowel obstruction. No evidence of colitis or diverticulitis. Limited assessment of the colon which is empty partially collapsed. 5. No ascites or free abdominal air. Electronically Signed   By: Lahoma Crocker M.D.   On: 05/04/2016 11:24   Dg Chest Portable 1 View  Result Date: 05/08/2016 CLINICAL DATA:  Chest pain and shortness of breath since Tuesday of last week. History of pacemaker, COPD, congestive heart failure, atrial fibrillation, hypertension. Previous smoker. EXAM: PORTABLE CHEST 1 VIEW COMPARISON:  CT chest 05/04/2016.  Chest 05/04/2016. FINDINGS: Borderline heart size with normal pulmonary vascularity. Cardiac pacemaker. Mediastinal contours appear intact. Suggestion of vague opacities demonstrated in the right middle lung and both lower lungs probably corresponding to infiltrates better seen on previous CT. No progression. No blunting of costophrenic angles. No pneumothorax. IMPRESSION: Patchy areas of vague infiltration in the lungs likely corresponding to infiltrates seen on previous chest CT. No evidence of progression.  Electronically Signed   By: Lucienne Capers M.D.   On: 05/08/2016 21:35   Dg Abd Portable 1v  Result Date: 05/09/2016 CLINICAL DATA:  Abdominal pain, nausea, anorexia for the past 8 days. EXAM: PORTABLE ABDOMEN - 1 VIEW COMPARISON:  Abdominal and pelvic CT scan and abdominal ultrasound of March 8th and May 08, 2016 respectively. FINDINGS: There is a small amount of gas within the  stomach. There is a moderate amount of gas within minimally distended small bowel loops. There is gas and stool in the colon and rectum. No free extraluminal gas collections are observed. IMPRESSION: Mildly increased volume of small bowel gas likely reflects an ileus or gastroenteritis type process. No evidence of obstruction or perforation. Electronically Signed   By: David  [place] M.D.   On: 05/09/2016 08:58   US Abdomen Limited Ruq  Result Date: 05/08/2016 CLINICAL DATA:  Right upper quadrant pain for 2 weeks EXAM: US ABDOMEN LIMITED - RIGHT UPPER QUADRANT COMPARISON:  05/04/2016 CT FINDINGS: Gallbladder: No gallstones or wall thickening visualized. No sonographic Murphy sign noted by sonographer. Common bile duct: Diameter: Normal at 3.3 mm Liver: No focal lesion identified. Within normal limits in parenchymal echogenicity. IMPRESSION: Negative right upper quadrant abdominal ultrasound Electronically Signed   By: Donavan Foil M.D.   On: 05/08/2016 23:26    Microbiology: Recent Results (from the past 240 hour(s))  Blood culture (routine x 2)     Status: None   Collection Time: 05/04/16 10:39 AM  Result Value Ref Range Status   Specimen Description BLOOD LEFT HAND  Final   Special Requests BOTTLES DRAWN AEROBIC AND ANAEROBIC 4CC EACH  Final   Culture NO GROWTH 5 DAYS  Final   Report Status 05/09/2016 FINAL  Final  Blood culture (routine x 2)     Status: None   Collection Time: 05/04/16 10:43 AM  Result Value Ref Range Status   Specimen Description BLOOD LEFT FOREARM  Final   Special Requests BOTTLES DRAWN AEROBIC AND ANAEROBIC 4CC EACH  Final   Culture NO GROWTH 5 DAYS  Final   Report Status 05/09/2016 FINAL  Final  Blood culture (routine x 2)     Status: None (Preliminary result)   Collection Time: 05/08/16 11:28 PM  Result Value Ref Range Status   Specimen Description BLOOD LEFT FOREARM  Final   Special Requests IN PEDIATRIC BOTTLE 1CC  Final   Culture NO GROWTH 2 DAYS  Final    Report Status PENDING  Incomplete  Blood culture (routine x 2)     Status: None (Preliminary result)   Collection Time: 05/09/16  1:07 AM  Result Value Ref Range Status   Specimen Description BLOOD RIGHT HAND  Final   Special Requests AEROBIC BOTTLE ONLY 5ML  Final   Culture NO GROWTH 2 DAYS  Final   Report Status PENDING  Incomplete  MRSA PCR Screening     Status: None   Collection Time: 05/09/16  2:15 AM  Result Value Ref Range Status   MRSA by PCR NEGATIVE NEGATIVE Final    Comment:        The GeneXpert MRSA Assay (FDA approved for NASAL specimens only), is one component of a comprehensive MRSA colonization surveillance program. It is not intended to diagnose MRSA infection nor to guide or monitor treatment for MRSA infections.      Labs: Basic Metabolic Panel:  Recent Labs Lab 05/08/16 2111 05/08/16 2138 05/09/16 0348  NA 135 137 137  K 3.2* 3.2* 3.8  CL 101 100* 103  CO2 24  --  27  GLUCOSE 116* 115* 130*  BUN 7 10 9   CREATININE 1.10 1.10 1.01  CALCIUM 8.9  --  8.5*  MG  --   --  1.8   Liver Function Tests:  Recent Labs Lab 05/08/16 2111 05/09/16 0348  AST 124* 105*  ALT 121* 109*  ALKPHOS 50 45  BILITOT 1.6* 1.6*  PROT 6.9 6.3*  ALBUMIN 3.3* 3.0*    Recent Labs Lab 05/08/16 2111  LIPASE 21   No results for input(s): AMMONIA in the last 168 hours. CBC:  Recent Labs Lab 05/08/16 2111 05/08/16 2138 05/09/16 0348  WBC 9.5  --  5.2  NEUTROABS 6.8  --  4.6  HGB 13.7 13.9 12.7*  HCT 40.3 41.0 37.9*  MCV 91.4  --  92.7  PLT 176  --  153   Cardiac Enzymes:  Recent Labs Lab 05/09/16 0135 05/09/16 0348 05/09/16 0711  TROPONINI 0.03* <0.03 <0.03   BNP: BNP (last 3 results)  Recent Labs  05/08/16 2111  BNP 63.3    ProBNP (last 3 results) No results for input(s): PROBNP in the last 8760 hours.  CBG: No results for input(s): GLUCAP in the last 168 hours.   Signed:  Velvet Bathe MD.  [facility] 05/12/2016, 1:13  PM

## 2016-05-14 LAB — CULTURE, BLOOD (ROUTINE X 2)
Culture: NO GROWTH
Culture: NO GROWTH

## 2016-05-30 ENCOUNTER — Emergency Department: Payer: Medicaid Other

## 2016-05-30 ENCOUNTER — Emergency Department
Admission: EM | Admit: 2016-05-30 | Discharge: 2016-05-30 | Disposition: A | Discharge status: Home / Self Care | Payer: Medicaid Other | Attending: Student in an Organized Health Care Education/Training Program | Admitting: Student in an Organized Health Care Education/Training Program

## 2016-05-30 ENCOUNTER — Encounter: Payer: Self-pay | Admitting: Emergency Medicine

## 2016-05-30 DIAGNOSIS — I110 Hypertensive heart disease with heart failure: Secondary | ICD-10-CM | POA: Diagnosis not present

## 2016-05-30 DIAGNOSIS — R112 Nausea with vomiting, unspecified: Secondary | ICD-10-CM | POA: Diagnosis present

## 2016-05-30 DIAGNOSIS — R197 Diarrhea, unspecified: Secondary | ICD-10-CM | POA: Diagnosis not present

## 2016-05-30 DIAGNOSIS — Z79899 Other long term (current) drug therapy: Secondary | ICD-10-CM | POA: Diagnosis not present

## 2016-05-30 DIAGNOSIS — J449 Chronic obstructive pulmonary disease, unspecified: Secondary | ICD-10-CM | POA: Insufficient documentation

## 2016-05-30 DIAGNOSIS — I5043 Acute on chronic combined systolic (congestive) and diastolic (congestive) heart failure: Secondary | ICD-10-CM | POA: Diagnosis not present

## 2016-05-30 DIAGNOSIS — R1084 Generalized abdominal pain: Secondary | ICD-10-CM

## 2016-05-30 DIAGNOSIS — Z87891 Personal history of nicotine dependence: Secondary | ICD-10-CM | POA: Insufficient documentation

## 2016-05-30 LAB — CBC
HEMATOCRIT: 46.1 % (ref 40.0–52.0)
Hemoglobin: 15.7 g/dL (ref 13.0–18.0)
MCH: 30.3 pg (ref 26.0–34.0)
MCHC: 34 g/dL (ref 32.0–36.0)
MCV: 89.2 fL (ref 80.0–100.0)
PLATELETS: 126 10*3/uL — AB (ref 150–440)
RBC: 5.17 MIL/uL (ref 4.40–5.90)
RDW: 13.3 % (ref 11.5–14.5)
WBC: 7.6 10*3/uL (ref 3.8–10.6)

## 2016-05-30 LAB — URINALYSIS, COMPLETE (UACMP) WITH MICROSCOPIC
BILIRUBIN URINE: NEGATIVE
Bacteria, UA: NONE SEEN
GLUCOSE, UA: 50 mg/dL — AB
Ketones, ur: 5 mg/dL — AB
LEUKOCYTES UA: NEGATIVE
NITRITE: NEGATIVE
PH: 5 (ref 5.0–8.0)
Protein, ur: 100 mg/dL — AB
Specific Gravity, Urine: 1.03 (ref 1.005–1.030)

## 2016-05-30 LAB — URINE DRUG SCREEN, QUALITATIVE (ARMC ONLY)
Amphetamines, Ur Screen: NOT DETECTED
BARBITURATES, UR SCREEN: NOT DETECTED
BENZODIAZEPINE, UR SCRN: POSITIVE — AB
CANNABINOID 50 NG, UR ~~LOC~~: POSITIVE — AB
Cocaine Metabolite,Ur ~~LOC~~: NOT DETECTED
MDMA (Ecstasy)Ur Screen: NOT DETECTED
Methadone Scn, Ur: NOT DETECTED
OPIATE, UR SCREEN: NOT DETECTED
PHENCYCLIDINE (PCP) UR S: NOT DETECTED
Tricyclic, Ur Screen: NOT DETECTED

## 2016-05-30 LAB — BASIC METABOLIC PANEL
Anion gap: 8 (ref 5–15)
BUN: 12 mg/dL (ref 6–20)
CALCIUM: 9.5 mg/dL (ref 8.9–10.3)
CHLORIDE: 102 mmol/L (ref 101–111)
CO2: 25 mmol/L (ref 22–32)
CREATININE: 1.12 mg/dL (ref 0.61–1.24)
GFR calc Af Amer: >60 mL/min (ref >60)
GFR calc non Af Amer: >60 mL/min (ref >60)
GLUCOSE: 117 mg/dL — AB (ref 65–99)
Potassium: 4.1 mmol/L (ref 3.5–5.1)
Sodium: 135 mmol/L (ref 135–145)

## 2016-05-30 MED ORDER — IOPAMIDOL (ISOVUE-300) INJECTION 61%
100.0000 mL | Freq: Once | INTRAVENOUS | Status: AC | PRN
  Administered 2016-05-30: 100 mL via INTRAVENOUS

## 2016-05-30 MED ORDER — DICYCLOMINE HCL 20 MG PO TABS: 20.0000 mg | ORAL_TABLET | Freq: Three times a day (TID) | ORAL | 0 refills | Status: DC | PRN

## 2016-05-30 MED ORDER — SODIUM CHLORIDE 0.9 % IV BOLUS (SEPSIS)
1000.0000 mL | Freq: Once | INTRAVENOUS | Status: AC
  Administered 2016-05-30: 1000 mL via INTRAVENOUS

## 2016-05-30 MED ORDER — MORPHINE SULFATE (PF) 4 MG/ML IV SOLN
4.0000 mg | INTRAVENOUS | Status: DC | PRN
  Administered 2016-05-30: 4 mg via INTRAVENOUS
  Filled 2016-05-30: qty 1

## 2016-05-30 MED ORDER — PROMETHAZINE HCL 25 MG/ML IJ SOLN
12.5000 mg | Freq: Once | INTRAMUSCULAR | Status: AC
  Administered 2016-05-30: 12.5 mg via INTRAVENOUS
  Filled 2016-05-30: qty 1

## 2016-05-30 MED ORDER — METOCLOPRAMIDE HCL 10 MG PO TABS: 10.0000 mg | ORAL_TABLET | Freq: Three times a day (TID) | ORAL | 1 refills | Status: DC | PRN

## 2016-05-30 MED ORDER — METOCLOPRAMIDE HCL 10 MG PO TABS: 10.0000 mg | ORAL_TABLET | Freq: Three times a day (TID) | ORAL | 1 refills | Status: DC

## 2016-05-30 NOTE — Discharge Instructions (Signed)

## 2016-05-30 NOTE — ED Notes (Signed)
ED Provider at bedside. 

## 2016-05-30 NOTE — ED Notes (Signed)
Pt signed e signature. Iv d/ced from left hand.  D/c inst to pt and family.

## 2016-05-30 NOTE — ED Notes (Signed)
Resumed care from [place] rn.  Pt alert. Paced rhythm on monitor.  Pt alert.  Family with pt. Iv in place.

## 2016-05-30 NOTE — ED Triage Notes (Signed)
Patient presents to the ED with nausea and vomiting with food and drink x 1 month.  Patient states, "this is the 4th hospital I've been to and they can't figure out what's going on."  Patient reports that he was admitted to [facility] x 5 days but he continues to have vomiting.  Patient reports feeling very weak today.  Patient was originally diagnosed with pneumonia when symptoms began and patient was placed on a z-pack.  Patient reports taking multiple laxatives to try to have bowel movements but without much success. Patient reports vomiting approx. 10 times in the past 24 hours.

## 2016-05-30 NOTE — ED Provider Notes (Signed)
[facility] Emergency Department Provider Note    First MD Initiated Contact with Patient 05/30/16 1308     (approximate)  I have reviewed the triage vital signs and the nursing notes.   HISTORY  Chief Complaint Emesis and Weakness    HPI Gary Kirk is a 51 y.o. male presents for several months of recurrent nausea vomiting and decreased oral intake. Patient states that he's been off for hospitalization and not been able to figure out what it is. Patient presents today because he was having worsening vomiting and nausea. No measured fevers. Denies any chest pain or shortness of breath. States he's also been having "seedy diarrhea flecks "for the past 2-3 days.  No blood in his stool. Denies any severe pain at this point but does have worsening pain anytime he tries to eat.   Past Medical History:  Diagnosis Date  . A-fib ([place])   . AICD (automatic cardioverter/defibrillator) present   . CHF (congestive heart failure) ([place])   . COPD (chronic obstructive pulmonary disease) ([place])   . Hypertension   . Presence of permanent cardiac pacemaker    Family History  Problem Relation Age of Onset  . COPD Mother     decsd 2008  . Hypertension Mother   . Atrial fibrillation Mother   . Heart failure Mother   . Cancer Father     unknown cancer, died when patient was 27  . Lung cancer Brother   . Atrial fibrillation Brother    Past Surgical History:  Procedure Laterality Date  . CARDIAC DEFIBRILLATOR PLACEMENT    . PACEMAKER IMPLANT     Patient Active Problem List   Diagnosis Date Noted  . Acute on chronic combined systolic and diastolic CHF (congestive heart failure) ([place])   . Atrial fibrillation with rapid ventricular response ([place])   . Low TSH level   . Chest pain 05/09/2016  . Multifocal pneumonia 05/09/2016  . Hypothyroidism 05/09/2016  . Nausea and vomiting 05/09/2016  . Hypokalemia 05/09/2016  . Ileus ([place]) 05/09/2016  . Non-ischemic  cardiomyopathy ([place])   . Prolonged Q-T interval on ECG   . Creatinine elevation   . Severe episode of recurrent major depressive disorder, without psychotic features ([place])   . Severe recurrent major depression without psychotic features ([place]) 05/24/2015  . MDD (major depressive disorder) 05/24/2015  . MDD (major depressive disorder), recurrent episode, severe ([place]) 05/24/2015      Prior to Admission medications   Medication Sig Start Date End Date Taking? Authorizing Provider  apixaban (ELIQUIS) 5 MG TABS tablet Take 1 tablet (5 mg total) by mouth 2 (two) times daily. 06/02/15   Kerrie Buffalo, NP  carvedilol (COREG) 6.25 MG tablet Take 1 tablet (6.25 mg total) by mouth 2 (two) times daily with a meal. 05/12/16   Velvet Bathe, MD  furosemide (LASIX) 40 MG tablet Take 1 tablet (40 mg total) by mouth daily. Patient not taking: Reported on 05/08/2016 06/02/15   Kerrie Buffalo, NP  levothyroxine (SYNTHROID, LEVOTHROID) 25 MCG tablet Take 1 tablet (25 mcg total) by mouth daily before breakfast. 05/18/16   Velvet Bathe, MD  NITROSTAT 0.4 MG SL tablet Place 1 tablet (0.4 mg total) under the tongue every five (5) minutes as needed for chest pain. 03/12/15   Historical Provider, MD  potassium chloride SA (K-DUR,KLOR-CON) 20 MEQ tablet Take 1 tablet (20 mEq total) by mouth daily. Patient not taking: Reported on 05/08/2016 06/02/15   Kerrie Buffalo, NP  tiotropium [facility]  HANDIHALER) 18 MCG inhalation capsule Place 1 capsule (18 mcg total) into inhaler and inhale daily as needed (for shortness of breath). Patient not taking: Reported on 05/08/2016 06/02/15   Kerrie Buffalo, NP    Allergies Tramadol; Abilify [aripiprazole]; and Motrin [ibuprofen]    Social History Social History  Substance Use Topics  . Smoking status: Former Smoker    Types: Cigarettes    Quit date: 04/29/2014  . Smokeless tobacco: Never Used  . Alcohol use No    Review of Systems Patient denies headaches, rhinorrhea, blurry vision,  numbness, shortness of breath, chest pain, edema, cough, abdominal pain, nausea, vomiting, diarrhea, dysuria, fevers, rashes or hallucinations unless otherwise stated above in HPI. ____________________________________________   PHYSICAL EXAM:  VITAL SIGNS: Vitals:   05/30/16 1213  BP: (!) 122/97  Pulse: 66  Resp: 18  Temp: 97.8 F (36.6 C)    Constitutional: Alert and oriented.  in no acute distress. Eyes: Conjunctivae are normal. PERRL. EOMI. Head: Atraumatic. Nose: No congestion/rhinnorhea. Mouth/Throat: Mucous membranes are moist.  Oropharynx non-erythematous. Neck: No stridor. Painless ROM. No cervical spine tenderness to palpation Hematological/Lymphatic/Immunilogical: No cervical lymphadenopathy. Cardiovascular: Normal rate, regular rhythm. Grossly normal heart sounds.  Good peripheral circulation. Respiratory: Normal respiratory effort.  No retractions. Lungs CTAB. Gastrointestinal: Soft with mild epigastric ttp. No distention. No abdominal bruits. No CVA tenderness. Musculoskeletal: No lower extremity tenderness nor edema.  No joint effusions. Neurologic:  Normal speech and language. No gross focal neurologic deficits are appreciated. No gait instability. Skin:  Skin is warm, dry and intact. No rash noted. Psychiatric: Mood and affect are normal. Speech and behavior are normal.  ____________________________________________   LABS (all labs ordered are listed, but only abnormal results are displayed)  Results for orders placed or performed during the hospital encounter of 05/30/16 (from the past 24 hour(s))  Basic metabolic panel     Status: Abnormal   Collection Time: 05/30/16 12:23 PM  Result Value Ref Range   Sodium 135 135 - 145 mmol/L   Potassium 4.1 3.5 - 5.1 mmol/L   Chloride 102 101 - 111 mmol/L   CO2 25 22 - 32 mmol/L   Glucose, Bld 117 (H) 65 - 99 mg/dL   BUN 12 6 - 20 mg/dL   Creatinine, Ser 1.12 0.61 - 1.24 mg/dL   Calcium 9.5 8.9 - 10.3 mg/dL   GFR  calc non Af Amer >60 >60 mL/min   GFR calc Af Amer >60 >60 mL/min   Anion gap 8 5 - 15  CBC     Status: Abnormal   Collection Time: 05/30/16 12:23 PM  Result Value Ref Range   WBC 7.6 3.8 - 10.6 K/uL   RBC 5.17 4.40 - 5.90 MIL/uL   Hemoglobin 15.7 13.0 - 18.0 g/dL   HCT 46.1 40.0 - 52.0 %   MCV 89.2 80.0 - 100.0 fL   MCH 30.3 26.0 - 34.0 pg   MCHC 34.0 32.0 - 36.0 g/dL   RDW 13.3 11.5 - 14.5 %   Platelets 126 (L) 150 - 440 K/uL   ____________________________________________  EKG My review and personal interpretation at Time: 12:17   Indication: abdominal pain  Rate: 115  Rhythm: v paced Axis: left Other: paced rhythm without sgarbossa criteria ____________________________________________  RADIOLOGY  I personally reviewed all radiographic images ordered to evaluate for the above acute complaints and reviewed radiology reports and findings.  These findings were personally discussed with the patient.  Please see medical record for radiology report.  ____________________________________________  PROCEDURES  Procedure(s) performed:  Procedures    Critical Care performed: no ____________________________________________   INITIAL IMPRESSION / ASSESSMENT AND PLAN / ED COURSE  Pertinent labs & imaging results that were available during my care of the patient were reviewed by me and considered in my medical decision making (see chart for details).  DDX: colitis, enteritis, gastritis, pna, chf, sbo,   Gary Kirk is a 51 y.o. who presents to the ED with Chronic nausea and vomiting abdominal discomfort as described above. Patient afebrile and hemodynamically stable. Based on the change in character of symptoms will order CT imaging to evaluate for acute abnormality. The patient will be placed on continuous pulse oximetry and telemetry for monitoring.  Laboratory evaluation will be sent to evaluate for the above complaints.     Clinical Course as of May 31 2002  Tue May 30, 2016  1612 Blood work is reassuring. CT imaging without any evidence of acute abnormality. UDS does show the patient is positive for cannabinoids and the patient admits to smoking multiple times throughout the day. I informed the patient this very likely could be causing some as his symptoms. I have encouraged him to take hot showers to relieve symptoms and to stop smoking marijuana. We'll provide referral for GI as some component of chronic inflammatory bowel disease remains on the differential but do not feel the patient requires admission at this point.  Have discussed with the patient and available family all diagnostics and treatments performed thus far and all questions were answered to the best of my ability. The patient demonstrates understanding and agreement with plan.   [PR]    Clinical Course User Index [PR] Merlyn Lot, MD     ____________________________________________   FINAL CLINICAL IMPRESSION(S) / ED DIAGNOSES  Final diagnoses:  Nausea vomiting and diarrhea  Generalized abdominal pain      NEW MEDICATIONS STARTED DURING THIS VISIT:  New Prescriptions   No medications on file     Note:  This document was prepared using Dragon voice recognition software and may include unintentional dictation errors.     Merlyn Lot, MD 05/30/16 2005

## 2016-05-31 ENCOUNTER — Other Ambulatory Visit: Payer: Self-pay

## 2016-05-31 ENCOUNTER — Telehealth: Payer: Self-pay

## 2016-05-31 ENCOUNTER — Telehealth: Payer: Self-pay | Admitting: Gastroenterology

## 2016-05-31 DIAGNOSIS — R1013 Epigastric pain: Secondary | ICD-10-CM

## 2016-05-31 DIAGNOSIS — Z860100 Personal history of colon polyps, unspecified: Secondary | ICD-10-CM

## 2016-05-31 NOTE — Telephone Encounter (Signed)
Unable to fax request.   Told to contact Lennette Bihari @ Dr. Gwenyth Bender for urgent request @ [facility] office.

## 2016-05-31 NOTE — Telephone Encounter (Signed)
05/31/16 No authorization required for Hx of colon polyps/ abd pain Z86.010 / R10.9  Colonoscopy and EGD [id number] /[id number]

## 2016-05-31 NOTE — Telephone Encounter (Signed)
Gastroenterology Pre-Procedure Review  Request Date: 06/15/16 Requesting Physician: Dr. Vicente Males  PATIENT REVIEW QUESTIONS: The patient responded to the following health history questions as indicated:    1. Are you having any GI issues? yes (Diarrhea, Abd Pain) 2. Do you have a personal history of Polyps? yes (removed) 3. Do you have a family history of Colon Cancer or Polyps? No  4. Diabetes Mellitus? no 5. Joint replacements in the past 12 months?no 6. Major health problems in the past 3 months?no 7. Any artificial heart valves, MVP, or defibrillator?yes (defibrillator, pacemaker)    MEDICATIONS & ALLERGIES:    Patient reports the following regarding taking any anticoagulation/antiplatelet therapy:   Plavix, Coumadin, Eliquis, Xarelto, Lovenox, Pradaxa, Brilinta, or Effient? yes (Eliquis) Aspirin? no  Patient confirms/reports the following medications:  Current Outpatient Prescriptions  Medication Sig Dispense Refill  . apixaban (ELIQUIS) 5 MG TABS tablet Take 1 tablet (5 mg total) by mouth 2 (two) times daily. 60 tablet 0  . carvedilol (COREG) 6.25 MG tablet Take 1 tablet (6.25 mg total) by mouth 2 (two) times daily with a meal. 60 tablet 0  . dicyclomine (BENTYL) 20 MG tablet Take 1 tablet (20 mg total) by mouth 3 (three) times daily as needed for spasms. 30 tablet 0  . furosemide (LASIX) 40 MG tablet Take 1 tablet (40 mg total) by mouth daily. (Patient not taking: Reported on 05/08/2016) 30 tablet 0  . levothyroxine (SYNTHROID, LEVOTHROID) 25 MCG tablet Take 1 tablet (25 mcg total) by mouth daily before breakfast. 30 tablet 0  . metoCLOPramide (REGLAN) 10 MG tablet Take 1 tablet (10 mg total) by mouth 3 (three) times daily with meals. 90 tablet 1  . metoCLOPramide (REGLAN) 10 MG tablet Take 1 tablet (10 mg total) by mouth every 8 (eight) hours as needed for nausea or vomiting. 12 tablet 1  . NITROSTAT 0.4 MG SL tablet Place 1 tablet (0.4 mg total) under the tongue every five (5) minutes  as needed for chest pain.  2  . potassium chloride SA (K-DUR,KLOR-CON) 20 MEQ tablet Take 1 tablet (20 mEq total) by mouth daily. (Patient not taking: Reported on 05/08/2016) 30 tablet 0  . tiotropium (SPIRIVA HANDIHALER) 18 MCG inhalation capsule Place 1 capsule (18 mcg total) into inhaler and inhale daily as needed (for shortness of breath). (Patient not taking: Reported on 05/08/2016) 30 capsule 0   No current facility-administered medications for this visit.     Patient confirms/reports the following allergies:  Allergies  Allergen Reactions  . Tramadol Shortness Of Breath    And, flu-like symptoms  . Abilify [Aripiprazole] Other (See Comments)    Had negative side effects (on him)  . Motrin [Ibuprofen] Other (See Comments)    Spits up blood "Bleeding ulcers" per pt Spits up blood    No orders of the defined types were placed in this encounter.   AUTHORIZATION INFORMATION Primary Insurance: 1D#: Group #:  Secondary Insurance: 1D#: Group #:  SCHEDULE INFORMATION: Date: 06/15/16 Time: Location: [place]

## 2016-06-05 ENCOUNTER — Telehealth: Payer: Self-pay

## 2016-06-05 NOTE — Telephone Encounter (Signed)
Advised patient Blood Thinner request received from Dr. Margrett Rud.   Pt to stop Eliquis 2 days prior and restart 1 day following.

## 2016-06-09 ENCOUNTER — Telehealth: Payer: Self-pay | Admitting: Gastroenterology

## 2016-06-09 NOTE — Telephone Encounter (Signed)
06/09/16 [facility] does NOT require prior auth for EGD & colonoscopy Z86.010 and R10.13

## 2016-06-15 ENCOUNTER — Ambulatory Visit
Admission: RE | Admit: 2016-06-15 | Discharge: 2016-06-15 | Disposition: A | Discharge status: Home / Self Care | Payer: Medicaid Other | Source: Ambulatory Visit | Attending: Gastroenterology | Admitting: Gastroenterology

## 2016-06-15 ENCOUNTER — Encounter: Payer: Self-pay | Admitting: Anesthesiology

## 2016-06-15 ENCOUNTER — Encounter
Admission: RE | Disposition: A | Discharge status: Home / Self Care | Payer: Self-pay | Source: Ambulatory Visit | Attending: Gastroenterology

## 2016-06-15 DIAGNOSIS — I4891 Unspecified atrial fibrillation: Secondary | ICD-10-CM | POA: Diagnosis not present

## 2016-06-15 DIAGNOSIS — R1013 Epigastric pain: Secondary | ICD-10-CM

## 2016-06-15 DIAGNOSIS — J449 Chronic obstructive pulmonary disease, unspecified: Secondary | ICD-10-CM | POA: Diagnosis not present

## 2016-06-15 DIAGNOSIS — Z86010 Personal history of colonic polyps: Secondary | ICD-10-CM | POA: Diagnosis not present

## 2016-06-15 DIAGNOSIS — I509 Heart failure, unspecified: Secondary | ICD-10-CM | POA: Insufficient documentation

## 2016-06-15 DIAGNOSIS — Z79899 Other long term (current) drug therapy: Secondary | ICD-10-CM | POA: Insufficient documentation

## 2016-06-15 DIAGNOSIS — I110 Hypertensive heart disease with heart failure: Secondary | ICD-10-CM | POA: Diagnosis not present

## 2016-06-15 DIAGNOSIS — Z539 Procedure and treatment not carried out, unspecified reason: Secondary | ICD-10-CM | POA: Diagnosis not present

## 2016-06-15 DIAGNOSIS — Z87891 Personal history of nicotine dependence: Secondary | ICD-10-CM | POA: Insufficient documentation

## 2016-06-15 DIAGNOSIS — Z1211 Encounter for screening for malignant neoplasm of colon: Secondary | ICD-10-CM | POA: Insufficient documentation

## 2016-06-15 HISTORY — DX: Sleep apnea, unspecified: G47.30

## 2016-06-15 SURGERY — COLONOSCOPY WITH PROPOFOL
Anesthesia: General

## 2016-06-15 MED ORDER — SODIUM CHLORIDE 0.9 % IV SOLN: INTRAVENOUS | Status: DC

## 2016-06-15 NOTE — Anesthesia Preprocedure Evaluation (Deleted)
Anesthesia Evaluation  Patient identified by MRN, date of birth, ID band Patient awake    Reviewed: Allergy & Precautions, NPO status , Patient's Chart, lab work & pertinent test results, reviewed documented beta blocker date and time   Airway Mallampati: III       Dental  (+) Poor Dentition   Pulmonary sleep apnea , pneumonia, resolved, COPD,  COPD inhaler, former smoker,    Pulmonary exam normal        Cardiovascular hypertension, Pt. on medications and Pt. on home beta blockers + CAD and +CHF  Normal cardiovascular exam+ dysrhythmias Atrial Fibrillation + pacemaker + Cardiac Defibrillator      Neuro/Psych PSYCHIATRIC DISORDERS Depression negative neurological ROS     GI/Hepatic negative GI ROS, (+) Hepatitis -  Endo/Other  Hypothyroidism   Renal/GU Renal InsufficiencyRenal disease  negative genitourinary   Musculoskeletal negative musculoskeletal ROS (+)   Abdominal Normal abdominal exam  (+)   Peds negative pediatric ROS (+)  Hematology negative hematology ROS (+)   Anesthesia Other Findings   Reproductive/Obstetrics                             Anesthesia Physical Anesthesia Plan  ASA: IV  Anesthesia Plan: General   Post-op Pain Management:    Induction: Intravenous  Airway Management Planned: Nasal Cannula  Additional Equipment:   Intra-op Plan:   Post-operative Plan:   Informed Consent: I have reviewed the patients History and Physical, chart, labs and discussed the procedure including the risks, benefits and alternatives for the proposed anesthesia with the patient or authorized representative who has indicated his/her understanding and acceptance.   Dental advisory given  Plan Discussed with: CRNA and Surgeon  Anesthesia Plan Comments:         Anesthesia Quick Evaluation

## 2016-06-15 NOTE — H&P (Deleted)
Gary Bellows MD [address] [place], [place] [address] Phone: [phone] Fax : [phone]  Primary Care Physician:  No PCP Per Patient Primary Gastroenterologist:  Dr. Jonathon Kirk   Pre-Procedure History & Physical: HPI:  Gary Kirk is a 51 y.o. male is here for an colonoscopy.   Past Medical History:  Diagnosis Date  . A-fib ([place])   . AICD (automatic cardioverter/defibrillator) present   . CHF (congestive heart failure) ([place])   . COPD (chronic obstructive pulmonary disease) ([place])   . Hypertension   . Presence of permanent cardiac pacemaker     Past Surgical History:  Procedure Laterality Date  . CARDIAC DEFIBRILLATOR PLACEMENT    . PACEMAKER IMPLANT      Prior to Admission medications   Medication Sig Start Date End Date Taking? Authorizing Provider  apixaban (ELIQUIS) 5 MG TABS tablet Take 1 tablet (5 mg total) by mouth 2 (two) times daily. 06/02/15   Kerrie Buffalo, NP  carvedilol (COREG) 6.25 MG tablet Take 1 tablet (6.25 mg total) by mouth 2 (two) times daily with a meal. 05/12/16   Velvet Bathe, MD  dicyclomine (BENTYL) 20 MG tablet Take 1 tablet (20 mg total) by mouth 3 (three) times daily as needed for spasms. 05/30/16 05/30/17  Merlyn Lot, MD  furosemide (LASIX) 40 MG tablet Take 1 tablet (40 mg total) by mouth daily. Patient not taking: Reported on 05/08/2016 06/02/15   Kerrie Buffalo, NP  levothyroxine (SYNTHROID, LEVOTHROID) 25 MCG tablet Take 1 tablet (25 mcg total) by mouth daily before breakfast. 05/18/16   Velvet Bathe, MD  metoCLOPramide (REGLAN) 10 MG tablet Take 1 tablet (10 mg total) by mouth 3 (three) times daily with meals. 05/30/16 05/30/17  Merlyn Lot, MD  metoCLOPramide (REGLAN) 10 MG tablet Take 1 tablet (10 mg total) by mouth every 8 (eight) hours as needed for nausea or vomiting. 05/30/16 05/30/17  Merlyn Lot, MD  NITROSTAT 0.4 MG SL tablet Place 1 tablet (0.4 mg total) under the tongue every five (5) minutes as needed for chest  pain. 03/12/15   Historical Provider, MD  potassium chloride SA (K-DUR,KLOR-CON) 20 MEQ tablet Take 1 tablet (20 mEq total) by mouth daily. Patient not taking: Reported on 05/08/2016 06/02/15   Kerrie Buffalo, NP  tiotropium (SPIRIVA HANDIHALER) 18 MCG inhalation capsule Place 1 capsule (18 mcg total) into inhaler and inhale daily as needed (for shortness of breath). Patient not taking: Reported on 05/08/2016 06/02/15   Kerrie Buffalo, NP    Allergies as of 05/31/2016 - Review Complete 05/30/2016  Allergen Reaction Noted  . Tramadol Shortness Of Breath and Nausea Only 04/01/2013  . Abilify [aripiprazole] Other (See Comments) 05/08/2016  . Motrin [ibuprofen] Other (See Comments) 04/01/2013    Family History  Problem Relation Age of Onset  . COPD Mother     decsd 2008  . Hypertension Mother   . Atrial fibrillation Mother   . Heart failure Mother   . Cancer Father     unknown cancer, died when patient was 35  . Lung cancer Brother   . Atrial fibrillation Brother     Social History   Social History  . Marital status: Legally Separated    Spouse name: N/A  . Number of children: N/A  . Years of education: N/A   Occupational History  . Not on file.   Social History Main Topics  . Smoking status: Former Smoker    Types: Cigarettes    Quit date: 04/29/2014  . Smokeless  tobacco: Never Used  . Alcohol use No  . Drug use: No  . Sexual activity: Not on file   Other Topics Concern  . Not on file   Social History Narrative  . No narrative on file    Review of Systems: See HPI, otherwise negative ROS  Physical Exam: There were no vitals taken for this visit. General:   Alert,  pleasant and cooperative in NAD Head:  Normocephalic and atraumatic. Neck:  Supple; no masses or thyromegaly. Lungs:  Clear throughout to auscultation.    Heart:  Regular rate and rhythm. Abdomen:  Soft, nontender and nondistended. Normal bowel sounds, without guarding, and without rebound.   Neurologic:   Alert and  oriented x4;  grossly normal neurologically.  Impression/Plan: JOEPH Kirk is here for an colonoscopy to be performed for surveillance due to prior historyof colon polyps   Risks, benefits, limitations, and alternatives regarding  colonoscopy have been reviewed with the patient.  Questions have been answered.  All parties agreeable.   Gary Bellows, MD  06/15/2016, 8:31 AM

## 2016-06-19 ENCOUNTER — Other Ambulatory Visit: Payer: Self-pay

## 2016-06-19 ENCOUNTER — Other Ambulatory Visit: Payer: Self-pay | Admitting: Gastroenterology

## 2016-06-19 DIAGNOSIS — R634 Abnormal weight loss: Secondary | ICD-10-CM

## 2016-06-19 DIAGNOSIS — R1111 Vomiting without nausea: Secondary | ICD-10-CM

## 2016-06-27 ENCOUNTER — Ambulatory Visit
Admission: RE | Admit: 2016-06-27 | Discharge: 2016-06-27 | Disposition: A | Discharge status: Home / Self Care | Payer: Medicaid Other | Source: Ambulatory Visit | Attending: Gastroenterology | Admitting: Gastroenterology

## 2016-06-27 DIAGNOSIS — R634 Abnormal weight loss: Secondary | ICD-10-CM | POA: Diagnosis present

## 2016-06-27 DIAGNOSIS — R1111 Vomiting without nausea: Secondary | ICD-10-CM | POA: Insufficient documentation

## 2016-10-27 ENCOUNTER — Other Ambulatory Visit: Payer: Self-pay

## 2016-11-06 ENCOUNTER — Ambulatory Visit: Payer: Self-pay

## 2017-04-18 NOTE — Progress Notes (Deleted)
Cardiology Office Note  Date:  04/18/2017   ID:  Gary Kirk, DOB [birth year], MRN [id number]  PCP:  [name], FNP   No chief complaint on file.   HPI:  Gary Kirk is a 52 y.o. male with PMH of  atrial fibrillation,  NICM by cath 2015 AV nodal ablation for atrial fibrillation. chronic systolic heart failure status post Bi-V ICD placement 9/4/ 2015 [facility]  He major depressive disorder,  hospitalized with psychiatry.  CRI, creatinine has been between 1.4 and 1.6. Who presents to establish care in the [place] office and for follow up of his atrial fib/CHF  2010 discovery in [place] of cardiomyopathy.    Had a left heart catheterization on 12/03/13-nonischemic cardiomyopathy, no CAD. Has history of crack cocaine use no illicit substances since October 2015.  ejection fraction on nuclear stress test in August 2016 is 28%  echocardiogram in October 2015 demonstrated EF of 15%.    Medtronic ICD as well.  on amiodarone and Eliquis for anticoagulation.  Has been described as having persistent atrial fibrillation however on most recent EKG on 05/29/15 he appears to have P waves preceding QRS complexes. EKG on 05/27/15 however appears to be more than atrial fibrillation like pattern.  He has been on amiodarone because he states that he had 10 prior discharges of his ICD shortly after implantation approximately one month after and has been on dose 400 mg once a day. VT.Last thyroid/TSH that I see in the system was June 2016 that was mildly elevated at 9.3. He also states that he has had some high difficulty because of the amiodarone. He understands however that he has been told he needs to stay on this medication.  PMH:   has a past medical history of A-fib ([place]), AICD (automatic cardioverter/defibrillator) present, CHF (congestive heart failure) ([place]), COPD (chronic obstructive pulmonary disease) ([place]), Hypertension, Presence of permanent cardiac pacemaker, and Sleep  apnea.  PSH:    Past Surgical History:  Procedure Laterality Date  . CARDIAC DEFIBRILLATOR PLACEMENT    . PACEMAKER IMPLANT      Current Outpatient Medications  Medication Sig Dispense Refill  . apixaban (ELIQUIS) 5 MG TABS tablet Take 1 tablet (5 mg total) by mouth 2 (two) times daily. 60 tablet 0  . carvedilol (COREG) 6.25 MG tablet Take 1 tablet (6.25 mg total) by mouth 2 (two) times daily with a meal. 60 tablet 0  . dicyclomine (BENTYL) 20 MG tablet Take 1 tablet (20 mg total) by mouth 3 (three) times daily as needed for spasms. 30 tablet 0  . furosemide (LASIX) 40 MG tablet Take 1 tablet (40 mg total) by mouth daily. (Patient not taking: Reported on 05/08/2016) 30 tablet 0  . levothyroxine (SYNTHROID, LEVOTHROID) 25 MCG tablet Take 1 tablet (25 mcg total) by mouth daily before breakfast. 30 tablet 0  . metoCLOPramide (REGLAN) 10 MG tablet Take 1 tablet (10 mg total) by mouth 3 (three) times daily with meals. (Patient not taking: Reported on 06/15/2016) 90 tablet 1  . metoCLOPramide (REGLAN) 10 MG tablet Take 1 tablet (10 mg total) by mouth every 8 (eight) hours as needed for nausea or vomiting. (Patient not taking: Reported on 06/15/2016) 12 tablet 1  . NITROSTAT 0.4 MG SL tablet Place 1 tablet (0.4 mg total) under the tongue every five (5) minutes as needed for chest pain.  2  . potassium chloride SA (K-DUR,KLOR-CON) 20 MEQ tablet Take 1 tablet (20 mEq total) by mouth daily. (Patient not taking:  Reported on 05/08/2016) 30 tablet 0  . tiotropium (SPIRIVA HANDIHALER) 18 MCG inhalation capsule Place 1 capsule (18 mcg total) into inhaler and inhale daily as needed (for shortness of breath). (Patient not taking: Reported on 05/08/2016) 30 capsule 0   No current facility-administered medications for this visit.      Allergies:   Tramadol; Abilify [aripiprazole]; and Motrin [ibuprofen]   Social History:  The patient  reports that he quit smoking about 2 years ago. His smoking use included  cigarettes. he has never used smokeless tobacco. He reports that he uses drugs. Drug: Marijuana. He reports that he does not drink alcohol.   Family History:   family history includes Atrial fibrillation in his brother and mother; COPD in his mother; Cancer in his father; Heart failure in his mother; Hypertension in his mother; Lung cancer in his brother.    Review of Systems: ROS   PHYSICAL EXAM: VS:  There were no vitals taken for this visit. , BMI There is no height or weight on file to calculate BMI. GEN: Well nourished, well developed, in no acute distress HEENT: normal Neck: no JVD, carotid bruits, or masses Cardiac: RRR; no murmurs, rubs, or gallops,no edema  Respiratory:  clear to auscultation bilaterally, normal work of breathing GI: soft, nontender, nondistended, + BS MS: no deformity or atrophy Skin: warm and dry, no rash Neuro:  Strength and sensation are intact Psych: euthymic mood, full affect    Recent Labs: 05/08/2016: B Natriuretic Peptide 63.3 05/09/2016: ALT 109; Magnesium 1.8; TSH <0.010 05/30/2016: BUN 12; Creatinine, Ser 1.12; Hemoglobin 15.7; Platelets 126; Potassium 4.1; Sodium 135    Lipid Panel No results found for: CHOL, HDL, LDLCALC, TRIG    Wt Readings from Last 3 Encounters:  06/15/16 300 lb (136.1 kg)  05/30/16 300 lb (136.1 kg)  05/12/16 (!) 301 lb 1.6 oz (136.6 kg)       ASSESSMENT AND PLAN:  No diagnosis found.   Disposition:   F/U  6 months  No orders of the defined types were placed in this encounter.    Signed, Esmond Plants, M.D., Ph.D. 04/18/2017  [place], [place]

## 2017-04-20 ENCOUNTER — Ambulatory Visit: Payer: Self-pay | Admitting: Cardiovascular Disease

## 2017-05-21 NOTE — Progress Notes (Signed)
Cardiology Office Note  Date:  05/23/2017   ID:  Gary Kirk, DOB Dec 28, 1965, MRN [id number]  PCP:  Gary Haggard, FNP   Chief Complaint  Patient presents with  . New Patient (Initial Visit)    Referred by [name] for Abnormal EKG. Patient c/o SOB. Meds reviewed verbally with patient.     HPI:  [name] is a 52 year old gentleman with past medical history of Nonischemic cardiomyopathy Cardiogenic shock 03/2013, shock liver, CR >3, pressors, PNA, hyponatremia, EF 15% Persistent atrial fibrillation Smoker, COPD,quit 2015 Sleep apnea, No CPAP Chronic systolic and diastolic CHF ICD, put in sept 4th 2015 ICD shock  11/2013 Major depression Hypothyroidism Nausea and vomiting summer 2018  Hypokalemia   Ileus (HCC)  Permanent Atrial fibrillation with rapid ventricular response (HCC)   Low TSH level   Acute on chronic combined systolic and diastolic CHF (congestive heart failure) ([place]) He presents by referral from [name] for consultation of his nonischemic cardiomyopathy and ICD  Previously managed by [facility] last seen 2017 by cardiology Some social issues, unable to do ICD download or go to appointments,  Now lives in a motel in [place], his own place  Reports that he feels well Reports having permanent atrial fibrillation Recent echocardiogram with [name], results not available but reports having ejection fraction 51%  Chronic mild shortness of breath with exertion, no significant change No check of his ICD in over a year, previously managed through [facility]  By his account , defibrillator placed 2015 in the setting of ejection fraction 15%, pneumonia, cardiogenic shock, requiring pressors, shock liver, hyponatremia, renal failure  Since then has been relatively stable Reports being very ill summer 2018, nausea and anorexia lost 40 pounds  Possibly secondary to thyroid abnormality Medications adjusted, then started to  feel better Now reports he is back on thyroid medication  No regular exercise program, weight continues to be a problem  Previous stress test September 2011  No recent ICD firing Reports last ICD shock October 2015, "shows unless I slept through one"  EKG personally reviewed by myself on todays visit Shows paced rhythm rate 89 bpm suspect underlying atrial fibrillation    PMH:   has a past medical history of A-fib ([place]), AICD (automatic cardioverter/defibrillator) present, CHF (congestive heart failure) ([place]), COPD (chronic obstructive pulmonary disease) ([place]), Hypertension, Presence of permanent cardiac pacemaker, and Sleep apnea.  PSH:    Past Surgical History:  Procedure Laterality Date  . CARDIAC DEFIBRILLATOR PLACEMENT    . PACEMAKER IMPLANT      Current Outpatient Medications  Medication Sig Dispense Refill  . apixaban (ELIQUIS) 5 MG TABS tablet Take 1 tablet (5 mg total) by mouth 2 (two) times daily. 60 tablet 0  . carvedilol (COREG) 6.25 MG tablet Take 1 tablet (6.25 mg total) by mouth 2 (two) times daily with a meal. 60 tablet 0  . gabapentin (NEURONTIN) 300 MG capsule Take 300 mg by mouth 2 (two) times daily.    Marland Kitchen levothyroxine (SYNTHROID, LEVOTHROID) 25 MCG tablet Take 1 tablet (25 mcg total) by mouth daily before breakfast. 30 tablet 0   No current facility-administered medications for this visit.      Allergies:   Tramadol; Abilify [aripiprazole]; and Motrin [ibuprofen]   Social History:  The patient  reports that he quit smoking about 3 years ago. His smoking use included cigarettes. He has never used smokeless tobacco. He reports that he has current or past drug history. Drug: Marijuana.  He reports that he does not drink alcohol.   Family History:   family history includes Atrial fibrillation in his brother and mother; COPD in his mother; Cancer in his father; Heart failure in his mother; Hypertension in his mother; Lung cancer in his brother.    Review of  Systems: Review of Systems  Constitutional: Negative.   Respiratory: Positive for shortness of breath.   Cardiovascular: Negative.   Gastrointestinal: Negative.   Musculoskeletal: Negative.   Neurological: Negative.   Psychiatric/Behavioral: Negative.   All other systems reviewed and are negative.    PHYSICAL EXAM: VS:  BP 122/90 (BP Location: Right Arm, Patient Position: Sitting, Cuff Size: Normal)   Pulse 89   Ht 5\' 11"  (1.803 m)   Wt (!) 305 lb (138.3 kg)   BMI 42.54 kg/m  , BMI Body mass index is 42.54 kg/m. GEN: Well nourished, well developed, in no acute distress , morbidly obese HEENT: normal  Neck: no JVD, carotid bruits, or masses Cardiac: RRR; no murmurs, rubs, or gallops,no edema  Respiratory:  clear to auscultation bilaterally, normal work of breathing GI: soft, nontender, nondistended, + BS MS: no deformity or atrophy  Skin: warm and dry, no rash Neuro:  Strength and sensation are intact Psych: euthymic mood, full affect    Recent Labs: 05/30/2016: BUN 12; Creatinine, Ser 1.12; Hemoglobin 15.7; Platelets 126; Potassium 4.1; Sodium 135    Lipid Panel No results found for: CHOL, HDL, LDLCALC, TRIG    Wt Readings from Last 3 Encounters:  05/23/17 (!) 305 lb (138.3 kg)  06/15/16 300 lb (136.1 kg)  05/30/16 300 lb (136.1 kg)       ASSESSMENT AND PLAN:  Non-ischemic cardiomyopathy ([place]) Ejection fraction 15% in February 2015 up to 51% per the patient on recent echocardiogram, result not available By his account he has not had any issues Appears relatively euvolemic, has not required Lasix in quite some time Feels comfortable on carvedilol with his Eliquis Does not want additional medications at this time  Atrial fibrillation with rapid ventricular response ([place]) Reports he is relatively asymptomatic from his atrial fibrillation, long-standing issue for many years  Acute on chronic combined systolic and diastolic CHF (congestive heart failure)  ([place]) Reports ejection fraction now up to 51% We have requested recent echocardiogram done at outside facility through primary care, images not available for review   Persistent atrial fibrillation ([place]) - Plan: EKG 12-Lead On Eliquis 5 twice daily Rate relatively well controlled, paced rhythm on carvedilol Did not want med changes  Centrilobular emphysema ([place])  Chronic mild shortness of breath with no recent exacerbation   Tobacco abuse Reports that he stopped smoking 2015  Morbid obesity We have encouraged continued exercise, careful diet management in an effort to lose weight.  ICD We will arrange follow-up with Dr. Caryl Comes to establish care  Disposition:   F/U  12 months  Patient was seen in consultation for [name] and will be referred back to her office for ongoing care of the issues detailed above   Total encounter time more than 60 minutes  Greater than 50% was spent in counseling and coordination of care with the patient    Orders Placed This Encounter  Procedures  . EKG 12-Lead     Signed, Esmond Plants, M.D., Ph.D. 05/23/2017  [place], [place]

## 2017-05-23 ENCOUNTER — Encounter: Payer: Self-pay | Admitting: Cardiovascular Disease

## 2017-05-23 ENCOUNTER — Ambulatory Visit (INDEPENDENT_AMBULATORY_CARE_PROVIDER_SITE_OTHER): Payer: Medicaid Other | Admitting: Cardiovascular Disease

## 2017-05-23 VITALS — BP 122/90 | HR 89 | Ht 71.0 in | Wt 305.0 lb

## 2017-05-23 DIAGNOSIS — I5043 Acute on chronic combined systolic (congestive) and diastolic (congestive) heart failure: Secondary | ICD-10-CM | POA: Diagnosis not present

## 2017-05-23 DIAGNOSIS — I5022 Chronic systolic (congestive) heart failure: Secondary | ICD-10-CM

## 2017-05-23 DIAGNOSIS — I4891 Unspecified atrial fibrillation: Secondary | ICD-10-CM

## 2017-05-23 DIAGNOSIS — I428 Other cardiomyopathies: Secondary | ICD-10-CM

## 2017-05-23 DIAGNOSIS — I481 Persistent atrial fibrillation: Secondary | ICD-10-CM | POA: Diagnosis not present

## 2017-05-23 DIAGNOSIS — Z720 Tobacco use: Secondary | ICD-10-CM | POA: Diagnosis not present

## 2017-05-23 DIAGNOSIS — J9611 Chronic respiratory failure with hypoxia: Secondary | ICD-10-CM | POA: Diagnosis not present

## 2017-05-23 DIAGNOSIS — J432 Centrilobular emphysema: Secondary | ICD-10-CM

## 2017-05-23 DIAGNOSIS — Z95810 Presence of automatic (implantable) cardiac defibrillator: Secondary | ICD-10-CM | POA: Insufficient documentation

## 2017-05-23 DIAGNOSIS — I4819 Other persistent atrial fibrillation: Secondary | ICD-10-CM

## 2017-05-23 NOTE — Patient Instructions (Addendum)
We will schedule an appt with Dr. Caryl Comes, new patient ICD check  Medication Instructions:   No medication changes made  Labwork:  No new labs needed  Testing/Procedures:  No further testing at this time   Follow-Up: It was a pleasure seeing you in the office today. Please call us if you have new issues that need to be addressed before your next appt.  [phone]  Your physician wants you to follow-up in: 12 months.  You will receive a reminder letter in the mail two months in advance. If you don't receive a letter, please call our office to schedule the follow-up appointment.  If you need a refill on your cardiac medications before your next appointment, please call your pharmacy.  For educational health videos Log in to : [email or web address] Or : [email or web address], password : [email or web address]

## 2017-06-12 ENCOUNTER — Ambulatory Visit (INDEPENDENT_AMBULATORY_CARE_PROVIDER_SITE_OTHER): Payer: Medicaid Other | Admitting: Internal Medicine

## 2017-06-12 ENCOUNTER — Encounter: Payer: Self-pay | Admitting: Internal Medicine

## 2017-06-12 VITALS — BP 124/84 | HR 89 | Ht 71.0 in | Wt 306.5 lb

## 2017-06-12 DIAGNOSIS — I5043 Acute on chronic combined systolic (congestive) and diastolic (congestive) heart failure: Secondary | ICD-10-CM | POA: Diagnosis not present

## 2017-06-12 DIAGNOSIS — I428 Other cardiomyopathies: Secondary | ICD-10-CM

## 2017-06-12 DIAGNOSIS — I481 Persistent atrial fibrillation: Secondary | ICD-10-CM

## 2017-06-12 DIAGNOSIS — Z95810 Presence of automatic (implantable) cardiac defibrillator: Secondary | ICD-10-CM

## 2017-06-12 DIAGNOSIS — I4819 Other persistent atrial fibrillation: Secondary | ICD-10-CM

## 2017-06-12 MED ORDER — CARVEDILOL 6.25 MG PO TABS: 6.2500 mg | ORAL_TABLET | Freq: Two times a day (BID) | ORAL | 3 refills | Status: DC

## 2017-06-12 MED ORDER — APIXABAN 5 MG PO TABS: 5.0000 mg | ORAL_TABLET | Freq: Two times a day (BID) | ORAL | 3 refills | Status: DC

## 2017-06-12 MED ORDER — LOSARTAN POTASSIUM 25 MG PO TABS: 25.0000 mg | ORAL_TABLET | Freq: Every day | ORAL | 3 refills | Status: DC

## 2017-06-12 NOTE — Patient Instructions (Addendum)
Medication Instructions: - Your physician has recommended you make the following change in your medication:   1) START losartan 25 mg- take 1 tablet (25 mg) by mouth once daily  Labwork: - Your physician recommends that you return for lab work in: 2 weeks- CMET/ CBC  Procedures/Testing: - none ordered  Follow-Up: - Your physician recommends that you schedule a follow-up appointment in: 4-6 weeks with the PA/ NP  - Your physician recommends that you schedule a follow-up appointment in: 4 months with Dr. Caryl Comes.   Any Additional Special Instructions Will Be Listed Below (If Applicable).     If you need a refill on your cardiac medications before your next appointment, please call your pharmacy.

## 2017-06-12 NOTE — Progress Notes (Signed)
ELECTROPHYSIOLOGY CONSULT NOTE  Patient ID: Gary Kirk, MRN: [id number], DOB/AGE: [birth year] 52 y.o. Admit date: (Not on file) Date of Consult: 06/12/2017  Primary Physician: [name], FNP Primary Cardiologist: Isabella Stalling is a 52 y.o. male who is being seen today for the evaluation of ICD at the request of TG.    HPI   hock  Gary Kirk is a 52 y.o. male with NICM and hx of cardiogenic schock 2015  He  Underwent CRT- ICD implant which was complicated by multiple shocks 2/2 Atrial fibrillation  Ablation was not discussed as he recalls  He has been followed sporadically in [place] seen 2017  and recently moved to [facility] and is now referred to [facility]  Saw Dr Deidre Ala  Chronic DOE and mild edema.    Struggling with increasing weight and inability to exercise   Has sleep apnea   No interval ICD shocks.  Struggles to afford his med, even the 3$ copay on medicaid  Outside records CareEverywhere and Epic reviewed    Past Medical History:  Diagnosis Date  . A-fib ([place])   . AICD (automatic cardioverter/defibrillator) present   . CHF (congestive heart failure) ([place])   . COPD (chronic obstructive pulmonary disease) ([place])   . Hypertension   . Presence of permanent cardiac pacemaker   . Sleep apnea    can't tolerate cpap       Surgical History:  Past Surgical History:  Procedure Laterality Date  . CARDIAC DEFIBRILLATOR PLACEMENT    . PACEMAKER IMPLANT       Home Meds: Prior to Admission medications   Medication Sig Start Date End Date Taking? Authorizing Provider  apixaban (ELIQUIS) 5 MG TABS tablet Take 1 tablet (5 mg total) by mouth 2 (two) times daily. 06/02/15  Yes Kerrie Buffalo, NP  carvedilol (COREG) 6.25 MG tablet Take 1 tablet (6.25 mg total) by mouth 2 (two) times daily with a meal. 05/12/16  Yes Velvet Bathe, MD  gabapentin (NEURONTIN) 300 MG capsule Take 300 mg by mouth 2 (two) times daily.   Yes [provider]    levothyroxine (SYNTHROID, LEVOTHROID) 112 MCG tablet Take 112 mcg by mouth daily before breakfast.   Yes [provider]    Allergies:  Allergies  Allergen Reactions  . Aripiprazole Other (See Comments) and Anaphylaxis    Had negative side effects (on him) Had negative side effects (on him) Had negative side effects (on him)  . Tramadol Shortness Of Breath and Nausea Only    And, flu-like symptoms Pt states he has "flu type symptoms" after receiving tramadol And, flu-like symptoms And, flu-like symptoms Pt states he has "flu type symptoms" after receiving tramadol  . Ibuprofen Other (See Comments)    Spits up blood "Bleeding ulcers" per pt Spits up blood Spits up blood "Bleeding ulcers" per pt    Social History   Socioeconomic History  . Marital status: Legally Separated    Spouse name: Not on file  . Number of children: Not on file  . Years of education: Not on file  . Highest education level: Not on file  Occupational History  . Not on file  Social Needs  . Financial resource strain: Not on file  . Food insecurity:    Worry: Not on file    Inability: Not on file  . Transportation needs:    Medical: Not on file    Non-medical: Not on file  Tobacco Use  . Smoking status: Former Smoker    Types: Cigarettes    Last attempt to quit: 04/29/2014    Years since quitting: 3.1  . Smokeless tobacco: Never Used  Substance and Sexual Activity  . Alcohol use: No  . Drug use: Yes    Types: Marijuana    Comment: used today 05/23/2017 at 6AM  . Sexual activity: Not on file  Lifestyle  . Physical activity:    Days per week: Not on file    Minutes per session: Not on file  . Stress: Not on file  Relationships  . Social connections:    Talks on phone: Not on file    Gets together: Not on file    Attends religious service: Not on file    Active member of club or organization: Not on file    Attends meetings of clubs or organizations: Not on file    Relationship  status: Not on file  . Intimate partner violence:    Fear of current or ex partner: Not on file    Emotionally abused: Not on file    Physically abused: Not on file    Forced sexual activity: Not on file  Other Topics Concern  . Not on file  Social History Narrative  . Not on file     Family History  Problem Relation Age of Onset  . COPD Mother        decsd 2008  . Hypertension Mother   . Atrial fibrillation Mother   . Heart failure Mother   . Cancer Father        unknown cancer, died when patient was 66  . Lung cancer Brother   . Atrial fibrillation Brother      ROS:  Please see the history of present illness.    All other systems reviewed and negative.    Physical Exam:  Blood pressure 124/84, pulse 89, height 5\' 11"  (1.803 m), weight (!) 306 lb 8 oz (139 kg). General: Well developed, Morbidly obese  male in no acute distress. Head: Normocephalic, atraumatic, sclera non-icteric, no xanthomas, nares are without discharge. EENT: normal  Lymph Nodes:  None Device pocket well healed; without hematoma or erythema.  There is no tethering  Neck: Negative for carotid bruits. JVD not elevated. Back:without scoliosis kyphosis Lungs: Clear bilaterally to auscultation without wheezes, rales, or rhonchi. Breathing is unlabored. Heart: RRR with S1 S2. No  murmur . No rubs, or gallops appreciated. Abdomen: Soft, non-tender, non-distended with normoactive bowel sounds. No hepatomegaly. No rebound/guarding. No obvious abdominal masses. Msk:  Strength and tone appear normal for age. Extremities: No clubbing or cyanosis. No edema.  Distal pedal pulses are 2+ and equal bilaterally. Skin: Warm and Dry Neuro: Alert and oriented X 3. CN III-XII intact Grossly normal sensory and motor function . Psych:  Responds to questions appropriately with a normal affect.      Labs: Cardiac Enzymes No results for input(s): CKTOTAL, CKMB, TROPONINI in the last 72 hours. CBC Lab Results  Component  Value Date   WBC 7.6 05/30/2016   HGB 15.7 05/30/2016   HCT 46.1 05/30/2016   MCV 89.2 05/30/2016   PLT 126 (L) 05/30/2016   PROTIME: No results for input(s): LABPROT, INR in the last 72 hours. Chemistry No results for input(s): NA, K, CL, CO2, BUN, CREATININE, CALCIUM, PROT, BILITOT, ALKPHOS, ALT, AST, GLUCOSE in the last 168 hours.  Invalid input(s): LABALBU Lipids No results found for: CHOL, HDL, LDLCALC, TRIG BNP  No results found for: PROBNP Thyroid Function Tests: No results for input(s): TSH, T4TOTAL, T3FREE, THYROIDAB in the last 72 hours.  Invalid input(s): FREET3 Miscellaneous No results found for: DDIMER  Radiology/Studies:  No results found.  EKG: Afib with Bi V pacing   Assessment and Plan:  NICM  CHF chronic systolic 3  ICD - CRT  Medtronic  Afib permanent  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    The patient presents with nonischemic cardiomyopathy.  LV function last assessed 2016.  20% at that time.  Is on beta-blockers.  We have reviewed the benefits of ACE/ARBS   for him is been an issue of cost.  We will begin him on losartan 50 mg.  If he is Gary to tolerate this, he would be appropriately considered for Aldactone.  We will have him reassessed in a few weeks to review this.  We will check a metabolic profile in 2 weeks following the initiation of the losartan.  His creatinine 3/18 was 1.2.    He has atrial fibrillation which is permanent.  His device appears to be programmed with a lower rate of 92% intrinsic conduction   I will try to get up w primary EP doctor to query regarding AV junction ablation.  We have today decrease his rate from 90--80 and had a great response.  He has tolerated apixaban without bleeding.  We will plan to recheck his CBC with blood work in 2 weeks.  He has a history of hepatitis with transaminases elevation 3 /18.  We will review this again with his blood work in 2 weeks.  I spoke with his EP doctor in [place]; he is recommended pursuing AV  junction ablation [facility]

## 2017-06-25 ENCOUNTER — Other Ambulatory Visit (INDEPENDENT_AMBULATORY_CARE_PROVIDER_SITE_OTHER): Payer: Medicaid Other

## 2017-06-25 DIAGNOSIS — I428 Other cardiomyopathies: Secondary | ICD-10-CM

## 2017-06-25 DIAGNOSIS — I4819 Other persistent atrial fibrillation: Secondary | ICD-10-CM

## 2017-06-25 DIAGNOSIS — I5043 Acute on chronic combined systolic (congestive) and diastolic (congestive) heart failure: Secondary | ICD-10-CM

## 2017-06-26 LAB — CBC WITH DIFFERENTIAL/PLATELET
BASOS ABS: 0.1 10*3/uL (ref 0.0–0.2)
Basos: 1 %
EOS (ABSOLUTE): 0.3 10*3/uL (ref 0.0–0.4)
Eos: 4 %
HEMOGLOBIN: 14.4 g/dL (ref 13.0–17.7)
Hematocrit: 42.7 % (ref 37.5–51.0)
IMMATURE GRANS (ABS): 0 10*3/uL (ref 0.0–0.1)
Immature Granulocytes: 0 %
LYMPHS: 28 %
Lymphocytes Absolute: 2.3 10*3/uL (ref 0.7–3.1)
MCH: 31.4 pg (ref 26.6–33.0)
MCHC: 33.7 g/dL (ref 31.5–35.7)
MCV: 93 fL (ref 79–97)
MONOCYTES: 7 %
Monocytes Absolute: 0.6 10*3/uL (ref 0.1–0.9)
NEUTROS ABS: 4.9 10*3/uL (ref 1.4–7.0)
Neutrophils: 60 %
PLATELETS: 177 10*3/uL (ref 150–379)
RBC: 4.58 x10E6/uL (ref 4.14–5.80)
RDW: 13.8 % (ref 12.3–15.4)
WBC: 8.1 10*3/uL (ref 3.4–10.8)

## 2017-06-26 LAB — CUP PACEART INCLINIC DEVICE CHECK
Date Time Interrogation Session: 20190430160916
Implantable Lead Implant Date: 20150904
Implantable Lead Implant Date: 20150904
Implantable Lead Location: 753859
Implantable Lead Model: 4298
Implantable Lead Model: 5076
Implantable Pulse Generator Implant Date: 20150904
MDC IDC LEAD IMPLANT DT: 20150904
MDC IDC LEAD LOCATION: 753858
MDC IDC LEAD LOCATION: 753860

## 2017-06-26 LAB — COMPREHENSIVE METABOLIC PANEL
ALT: 12 IU/L (ref 0–44)
AST: 21 IU/L (ref 0–40)
Albumin/Globulin Ratio: 1.6 (ref 1.2–2.2)
Albumin: 4.3 g/dL (ref 3.5–5.5)
Alkaline Phosphatase: 65 IU/L (ref 39–117)
BUN/Creatinine Ratio: 11 (ref 9–20)
BUN: 12 mg/dL (ref 6–24)
Bilirubin Total: 0.5 mg/dL (ref 0.0–1.2)
CALCIUM: 9.3 mg/dL (ref 8.7–10.2)
CHLORIDE: 100 mmol/L (ref 96–106)
CO2: 22 mmol/L (ref 20–29)
Creatinine, Ser: 1.14 mg/dL (ref 0.76–1.27)
GFR, EST AFRICAN AMERICAN: 85 mL/min/{1.73_m2} (ref >59)
GFR, EST NON AFRICAN AMERICAN: 74 mL/min/{1.73_m2} (ref >59)
GLUCOSE: 79 mg/dL (ref 65–99)
Globulin, Total: 2.7 g/dL (ref 1.5–4.5)
Potassium: 4.4 mmol/L (ref 3.5–5.2)
Sodium: 139 mmol/L (ref 134–144)
TOTAL PROTEIN: 7 g/dL (ref 6.0–8.5)

## 2017-07-10 ENCOUNTER — Ambulatory Visit (INDEPENDENT_AMBULATORY_CARE_PROVIDER_SITE_OTHER): Payer: Medicaid Other | Admitting: Nurse Practitioner

## 2017-07-10 ENCOUNTER — Encounter: Payer: Self-pay | Admitting: Nurse Practitioner

## 2017-07-10 VITALS — BP 128/80 | HR 86 | Ht 71.0 in | Wt 304.5 lb

## 2017-07-10 DIAGNOSIS — Z79899 Other long term (current) drug therapy: Secondary | ICD-10-CM

## 2017-07-10 DIAGNOSIS — F1210 Cannabis abuse, uncomplicated: Secondary | ICD-10-CM | POA: Diagnosis not present

## 2017-07-10 DIAGNOSIS — I482 Chronic atrial fibrillation: Secondary | ICD-10-CM

## 2017-07-10 DIAGNOSIS — I10 Essential (primary) hypertension: Secondary | ICD-10-CM | POA: Diagnosis not present

## 2017-07-10 DIAGNOSIS — I5022 Chronic systolic (congestive) heart failure: Secondary | ICD-10-CM

## 2017-07-10 DIAGNOSIS — I4821 Permanent atrial fibrillation: Secondary | ICD-10-CM

## 2017-07-10 DIAGNOSIS — I428 Other cardiomyopathies: Secondary | ICD-10-CM

## 2017-07-10 MED ORDER — SPIRONOLACTONE 25 MG PO TABS: 25.0000 mg | ORAL_TABLET | Freq: Every day | ORAL | 3 refills | Status: DC

## 2017-07-10 NOTE — Patient Instructions (Signed)
Medication Instructions:  Your physician has recommended you make the following change in your medication:  START Spironolactone 25 mg (1 tablet) by mouth once a day.   Labwork: Your physician recommends that you return for lab work in: [place] Spironolactone. Call our office to schedule a time to have the lab work.   Testing/Procedures: none  Follow-Up: Your physician recommends that you schedule a follow-up appointment in: [place] (September).   If you need a refill on your cardiac medications before your next appointment, please call your pharmacy.

## 2017-07-10 NOTE — Progress Notes (Signed)
Office Visit    Patient Name: Gary Kirk Date of Encounter: 07/10/2017  Primary Care Provider:  [name], FNP Primary Cardiologist:  Ida Rogue, MD  Chief Complaint    52 year old male with a history of nonischemic cardiomyopathy, HFrEF, status post biventricular ICD, hypertension, hypothyroidism, depression, sleep apnea, and permanent atrial fibrillation on Eliquis, who presents for follow-up.  Past Medical History    Past Medical History:  Diagnosis Date  . AICD (automatic cardioverter/defibrillator) present    a. 10/2013 s/p MDT [id number] BiV ICD (ser# [id number]).  . COPD (chronic obstructive pulmonary disease) ([place])    a. Quit smoking in 2015.  Marland Kitchen HFrEF (heart failure with reduced ejection fraction) ([place])    a. 02/2012 Echo: EF "depressed";  b. 03/2013 Echo: Ef 15%, mod to sev LV dil w/ anteroseptal AK and sev glob HK. Dilated RV w/ reduced fxn. Mild to mod RAE, Sev LAE. Mild TR; c. 11/2013 Echo: EF 10-15%, redcued RV fxn, mild BAE, mild TR.  Marland Kitchen History of ileus   . Hypertension   . Hypokalemia   . Hypothyroidism   . Major depression   . NICM (nonischemic cardiomyopathy) ([place])    a. 02/2012 Echo: depressed EF; b. 02/2012 MV: EF 38%, small fixed apical defect, no ischemia; c. 03/2013 Echo: EF 15%; d. 08/2013 MV: apical ant, septal inf fixed defects, no ischemia; e. 10/2013 s/p MDT [id number] BiV ICD; d. 11/2013 Cath: results not avail, reportedly nl cors; e. 09/2014 MV: Fixed inf defect consistent w/ diaphragmatic atten. No ischemia. EF 28%.  . Permanent atrial fibrillation (HCC)    a. CHA2DS2VASc = 2-->Eliquis.  . Sleep apnea    a. Doesn't tolerate CPAP.   Past Surgical History:  Procedure Laterality Date  . CARDIAC DEFIBRILLATOR PLACEMENT    . PACEMAKER IMPLANT      Allergies  Allergies  Allergen Reactions  . Aripiprazole Other (See Comments) and Anaphylaxis    Had negative side effects (on him) Had negative side effects (on him) Had negative side effects  (on him)  . Tramadol Shortness Of Breath and Nausea Only    And, flu-like symptoms Pt states he has "flu type symptoms" after receiving tramadol And, flu-like symptoms And, flu-like symptoms Pt states he has "flu type symptoms" after receiving tramadol  . Ibuprofen Other (See Comments)    Spits up blood "Bleeding ulcers" per pt Spits up blood Spits up blood "Bleeding ulcers" per pt    History of Present Illness    52 year old male with the above complex past medical history including nonischemic cardiomyopathy and HFrEF dating back to at least January 2014, with subsequent work-up and management at [facility] in [place], involving multiple nonischemic stress tests, biventricular ICD placement in September 2015, and reported catheterization in October 2015.  Last stress test was in August 2016, revealing a fixed inferior defect consistent with diaphragmatic attenuation.  No ischemia.  He also has a history of permanent atrial fibrillation, which is rate controlled on beta-blocker therapy.  He is anticoagulated with Eliquis.  He moved to the [place] area recently and establish care with Drs. Rockey Situ and Caryl Comes.  He was last seen by Dr. Caryl Comes in 05/2017, @ which time losartan therapy was added.   Since his last visit, he has been stable.  His weight is actually down 2 pounds.  He is careful with his salt intake at home and has been compliant with medications.  He has chronic, stable dyspnea on exertion and denies chest pain, palpitations,  PND, orthopnea, dizziness, syncope, edema, or early satiety.  He is relatively sedentary but notes that he recently walked to downtown [place] without any issues and was quite proud of himself.  He continues to smoke marijuana, usually 3 bowls a day.  He says this helps him with his anxiety.  Home Medications    Prior to Admission medications   Medication Sig Start Date End Date Taking? Authorizing Provider  apixaban (ELIQUIS) 5 MG TABS tablet Take 1 tablet (5 mg  total) by mouth 2 (two) times daily. 06/12/17   Deboraha Sprang, MD  carvedilol (COREG) 6.25 MG tablet Take 1 tablet (6.25 mg total) by mouth 2 (two) times daily with a meal. 06/12/17   Deboraha Sprang, MD  gabapentin (NEURONTIN) 300 MG capsule Take 300 mg by mouth 2 (two) times daily.    [provider]  levothyroxine (SYNTHROID, LEVOTHROID) 112 MCG tablet Take 112 mcg by mouth daily before breakfast.    [provider]  losartan (COZAAR) 25 MG tablet Take 1 tablet (25 mg total) by mouth daily. 06/12/17 09/10/17  Deboraha Sprang, MD    Review of Systems    Chronic, stable dyspnea on exertion.  He denies chest pain, palpitations, PND, orthopnea, dizziness, syncope, edema, or early satiety.  All other systems reviewed and are otherwise negative except as noted above.  Physical Exam    VS:  BP 128/80 (BP Location: Left Arm, Patient Position: Sitting, Cuff Size: Normal)   Pulse 86   Ht 5\' 11"  (1.803 m)   Wt (!) 304 lb 8 oz (138.1 kg)   BMI 42.47 kg/m  , BMI Body mass index is 42.47 kg/m. GEN: Obese, in no acute distress.  HEENT: normal.  Neck: Supple, obese, difficult to gauge JVP.  No carotid bruits, or masses. Cardiac: RRR, distant, no murmurs, rubs, or gallops. No clubbing, cyanosis, edema.  Radials/DP/PT 2+ and equal bilaterally.  Respiratory:  Respirations regular and unlabored, clear to auscultation bilaterally. GI: Obese, soft, nontender, nondistended, BS + x 4. MS: no deformity or atrophy. Skin: warm and dry, no rash. Neuro:  Strength and sensation are intact. Psych: Normal affect.  Accessory Clinical Findings    .lastrenal  Lab Results  Component Value Date   CREATININE 1.14 06/25/2017   BUN 12 06/25/2017   NA 139 06/25/2017   K 4.4 06/25/2017   CL 100 06/25/2017   CO2 22 06/25/2017   Lab Results  Component Value Date   WBC 8.1 06/25/2017   HGB 14.4 06/25/2017   HCT 42.7 06/25/2017   MCV 93 06/25/2017   PLT 177 06/25/2017    Assessment & Plan      1.  HFrEF/nonischemic cardiomyopathy: Patient with last documented EF of 28% in 2016.  Status post biventricular ICD placement in 2015.  Last seen in clinic in April and doing well since that time.  He has been tolerating beta-blocker and ARB therapy.  He has not required diuretic therapy.  Heart rate and blood pressure stable.  I will take this opportunity to add Spironolactone 25 mg daily.  Plan to follow-up basic metabolic panel 1 week after starting.  He will call us when he is able to fill it as he does not currently have money for the $3 co-pay.  We discussed the importance of daily weights, sodium restriction, medication compliance, and symptom reporting and he verbalizes understanding.  Weight is actually down 2 pounds since his last visit.  2.  Permanent atrial fibrillation: Rate controlled on  beta-blocker therapy.  Device reprogrammed at last EP visit.  Chronically anticoagulated on Eliquis and he reports compliance.  CBC within normal limits in late April.  3.  Essential hypertension: Stable.  4.  Marijuana abuse: He is smoking 3 bowls a day to help with his nerves.  We did discuss that marijuana use as previously been linked to increase occurrence of heart failure.  He says he is given up so much and this is pretty much all he has left.  It helps to calm his nerves.  Cessation advised.  5.  Morbid obesity: Patient is not exercising enough.  I encouraged him to walk more.  6.  Disposition: He will contact us to arrange for follow-up lab work once he starts taking Spironolactone.  He has follow-up with Dr. Caryl Comes in July.  I will arrange for general cardiology follow-up in approximately 4 months.  Murray Hodgkins, NP 07/10/2017, 12:17 PM

## 2017-08-24 ENCOUNTER — Other Ambulatory Visit: Payer: Self-pay | Admitting: Internal Medicine

## 2017-09-11 ENCOUNTER — Telehealth: Payer: Self-pay | Admitting: Internal Medicine

## 2017-09-11 NOTE — Telephone Encounter (Signed)
Patient scheduled for too soon appt with dr. Caryl Comes 7/23 attempted to call about r/s no ans no vm

## 2017-09-12 NOTE — Telephone Encounter (Signed)
Ok. Thank you.

## 2017-09-13 NOTE — Telephone Encounter (Signed)
Attempted to call patient - no answer - no vm

## 2017-09-17 NOTE — Telephone Encounter (Signed)
Lmov for patient to call back ° °

## 2017-09-18 ENCOUNTER — Encounter: Payer: Self-pay | Admitting: Internal Medicine

## 2017-09-18 NOTE — Telephone Encounter (Signed)
Pt did not come to appointment  Nothing else needed.

## 2017-11-11 NOTE — Progress Notes (Deleted)
Cardiology Office Note  Date:  11/11/2017   ID:  ZAKAR Kirk, DOB 1965-12-10, MRN [id number]  PCP:  [name], FNP   No chief complaint on file.   HPI:  [name] is a 52 year old gentleman with past medical history of Nonischemic cardiomyopathy Cardiogenic shock 03/2013, shock liver, CR >3, pressors, PNA, hyponatremia, EF 15% Persistent atrial fibrillation Smoker, COPD,quit 2015 Sleep apnea, No CPAP Chronic systolic and diastolic CHF ICD, put in sept 4th 2015 ICD shock  11/2013 Major depression Hypothyroidism Nausea and vomiting summer 2018  Hypokalemia   Ileus (HCC)  Permanent Atrial fibrillation with rapid ventricular response (HCC)   Low TSH level   Acute on chronic combined systolic and diastolic CHF (congestive heart failure) ([place]) He presents by referral from [name] for consultation of his nonischemic cardiomyopathy and ICD  Previously managed by [facility] last seen 2017 by cardiology Some social issues, unable to do ICD download or go to appointments,  Now lives in a motel in [place], his own place  Reports that he feels well Reports having permanent atrial fibrillation Recent echocardiogram with [name], results not available but reports having ejection fraction 51%  Chronic mild shortness of breath with exertion, no significant change No check of his ICD in over a year, previously managed through [facility]  By his account , defibrillator placed 2015 in the setting of ejection fraction 15%, pneumonia, cardiogenic shock, requiring pressors, shock liver, hyponatremia, renal failure  Since then has been relatively stable Reports being very ill summer 2018, nausea and anorexia lost 40 pounds  Possibly secondary to thyroid abnormality Medications adjusted, then started to feel better Now reports he is back on thyroid medication  No regular exercise program, weight continues to be a problem  Previous stress test  September 2011  No recent ICD firing Reports last ICD shock October 2015, "shows unless I slept through one"  EKG personally reviewed by myself on todays visit Shows paced rhythm rate 89 bpm suspect underlying atrial fibrillation    PMH:   has a past medical history of AICD (automatic cardioverter/defibrillator) present, COPD (chronic obstructive pulmonary disease) ([place]), HFrEF (heart failure with reduced ejection fraction) ([place]), History of ileus, Hypertension, Hypokalemia, Hypothyroidism, Major depression, NICM (nonischemic cardiomyopathy) ([place]), Permanent atrial fibrillation ([place]), and Sleep apnea.  PSH:    Past Surgical History:  Procedure Laterality Date  . CARDIAC DEFIBRILLATOR PLACEMENT    . PACEMAKER IMPLANT      Current Outpatient Medications  Medication Sig Dispense Refill  . apixaban (ELIQUIS) 5 MG TABS tablet Take 1 tablet (5 mg total) by mouth 2 (two) times daily. 180 tablet 3  . carvedilol (COREG) 6.25 MG tablet Take 1 tablet (6.25 mg total) by mouth 2 (two) times daily with a meal. 180 tablet 3  . gabapentin (NEURONTIN) 300 MG capsule Take 300 mg by mouth 2 (two) times daily.    Marland Kitchen levothyroxine (SYNTHROID, LEVOTHROID) 112 MCG tablet Take 112 mcg by mouth daily before breakfast.    . losartan (COZAAR) 25 MG tablet Take 1 tablet (25 mg total) by mouth daily. 90 tablet 3  . spironolactone (ALDACTONE) 25 MG tablet Take 1 tablet (25 mg total) by mouth daily. 90 tablet 3   No current facility-administered medications for this visit.      Allergies:   Aripiprazole; Tramadol; and Ibuprofen   Social History:  The patient  reports that he quit smoking about 3 years ago. His smoking use included cigarettes. He has  never used smokeless tobacco. He reports that he has current or past drug history. Drug: Marijuana. He reports that he does not drink alcohol.   Family History:   family history includes Atrial fibrillation in his brother and mother; COPD in his mother; Cancer in his  father; Heart failure in his mother; Hypertension in his mother; Lung cancer in his brother.    Review of Systems: Review of Systems  Constitutional: Negative.   Respiratory: Positive for shortness of breath.   Cardiovascular: Negative.   Gastrointestinal: Negative.   Musculoskeletal: Negative.   Neurological: Negative.   Psychiatric/Behavioral: Negative.   All other systems reviewed and are negative.    PHYSICAL EXAM: VS:  There were no vitals taken for this visit. , BMI There is no height or weight on file to calculate BMI. GEN: Well nourished, well developed, in no acute distress , morbidly obese HEENT: normal  Neck: no JVD, carotid bruits, or masses Cardiac: RRR; no murmurs, rubs, or gallops,no edema  Respiratory:  clear to auscultation bilaterally, normal work of breathing GI: soft, nontender, nondistended, + BS MS: no deformity or atrophy  Skin: warm and dry, no rash Neuro:  Strength and sensation are intact Psych: euthymic mood, full affect    Recent Labs: 06/25/2017: ALT 12; BUN 12; Creatinine, Ser 1.14; Hemoglobin 14.4; Platelets 177; Potassium 4.4; Sodium 139    Lipid Panel No results found for: CHOL, HDL, LDLCALC, TRIG    Wt Readings from Last 3 Encounters:  07/10/17 (!) 304 lb 8 oz (138.1 kg)  06/12/17 (!) 306 lb 8 oz (139 kg)  05/23/17 (!) 305 lb (138.3 kg)       ASSESSMENT AND PLAN:  Non-ischemic cardiomyopathy ([place]) Ejection fraction 15% in February 2015 up to 51% per the patient on recent echocardiogram, result not available By his account he has not had any issues Appears relatively euvolemic, has not required Lasix in quite some time Feels comfortable on carvedilol with his Eliquis Does not want additional medications at this time  Atrial fibrillation with rapid ventricular response ([place]) Reports he is relatively asymptomatic from his atrial fibrillation, long-standing issue for many years  Acute on chronic combined systolic and diastolic  CHF (congestive heart failure) ([place]) Reports ejection fraction now up to 51% We have requested recent echocardiogram done at outside facility through primary care, images not available for review   Persistent atrial fibrillation ([place]) - Plan: EKG 12-Lead On Eliquis 5 twice daily Rate relatively well controlled, paced rhythm on carvedilol Did not want med changes  Centrilobular emphysema ([place])  Chronic mild shortness of breath with no recent exacerbation   Tobacco abuse Reports that he stopped smoking 2015  Morbid obesity We have encouraged continued exercise, careful diet management in an effort to lose weight.  ICD We will arrange follow-up with Dr. Caryl Comes to establish care  Disposition:   F/U  12 months  Patient was seen in consultation for [name] and will be referred back to her office for ongoing care of the issues detailed above   Total encounter time more than 60 minutes  Greater than 50% was spent in counseling and coordination of care with the patient    No orders of the defined types were placed in this encounter.    Signed, Esmond Plants, M.D., Ph.D. 11/11/2017  [place], [place]

## 2017-11-12 ENCOUNTER — Ambulatory Visit: Payer: Self-pay | Admitting: Cardiovascular Disease

## 2017-11-13 ENCOUNTER — Encounter (HOSPITAL_COMMUNITY): Payer: Self-pay | Admitting: Emergency Medicine

## 2017-11-13 ENCOUNTER — Other Ambulatory Visit: Payer: Self-pay

## 2017-11-13 ENCOUNTER — Emergency Department (HOSPITAL_COMMUNITY): Payer: Medicaid Other

## 2017-11-13 ENCOUNTER — Emergency Department (HOSPITAL_COMMUNITY)
Admission: EM | Admit: 2017-11-13 | Discharge: 2017-11-13 | Disposition: A | Discharge status: Home / Self Care | Payer: Medicaid Other | Attending: Emergency Medicine | Admitting: Emergency Medicine

## 2017-11-13 DIAGNOSIS — Z79899 Other long term (current) drug therapy: Secondary | ICD-10-CM | POA: Insufficient documentation

## 2017-11-13 DIAGNOSIS — Z87891 Personal history of nicotine dependence: Secondary | ICD-10-CM | POA: Insufficient documentation

## 2017-11-13 DIAGNOSIS — N182 Chronic kidney disease, stage 2 (mild): Secondary | ICD-10-CM | POA: Insufficient documentation

## 2017-11-13 DIAGNOSIS — I130 Hypertensive heart and chronic kidney disease with heart failure and stage 1 through stage 4 chronic kidney disease, or unspecified chronic kidney disease: Secondary | ICD-10-CM | POA: Diagnosis not present

## 2017-11-13 DIAGNOSIS — J449 Chronic obstructive pulmonary disease, unspecified: Secondary | ICD-10-CM | POA: Insufficient documentation

## 2017-11-13 DIAGNOSIS — I5043 Acute on chronic combined systolic (congestive) and diastolic (congestive) heart failure: Secondary | ICD-10-CM | POA: Insufficient documentation

## 2017-11-13 DIAGNOSIS — R079 Chest pain, unspecified: Secondary | ICD-10-CM | POA: Diagnosis present

## 2017-11-13 DIAGNOSIS — Z950 Presence of cardiac pacemaker: Secondary | ICD-10-CM | POA: Insufficient documentation

## 2017-11-13 DIAGNOSIS — E039 Hypothyroidism, unspecified: Secondary | ICD-10-CM | POA: Diagnosis not present

## 2017-11-13 LAB — BASIC METABOLIC PANEL
Anion gap: 8 (ref 5–15)
BUN: 10 mg/dL (ref 6–20)
CALCIUM: 9 mg/dL (ref 8.9–10.3)
CHLORIDE: 104 mmol/L (ref 98–111)
CO2: 26 mmol/L (ref 22–32)
CREATININE: 1.08 mg/dL (ref 0.61–1.24)
GFR calc Af Amer: >60 mL/min (ref >60)
GFR calc non Af Amer: >60 mL/min (ref >60)
Glucose, Bld: 93 mg/dL (ref 70–99)
Potassium: 3.7 mmol/L (ref 3.5–5.1)
Sodium: 138 mmol/L (ref 135–145)

## 2017-11-13 LAB — TROPONIN I: Troponin I: <0.03 ng/mL (ref <0.03)

## 2017-11-13 LAB — CBC
HCT: 43.9 % (ref 39.0–52.0)
Hemoglobin: 15.1 g/dL (ref 13.0–17.0)
MCH: 32.9 pg (ref 26.0–34.0)
MCHC: 34.4 g/dL (ref 30.0–36.0)
MCV: 95.6 fL (ref 78.0–100.0)
PLATELETS: 165 10*3/uL (ref 150–400)
RBC: 4.59 MIL/uL (ref 4.22–5.81)
RDW: 13 % (ref 11.5–15.5)
WBC: 10.4 10*3/uL (ref 4.0–10.5)

## 2017-11-13 LAB — BRAIN NATRIURETIC PEPTIDE: B Natriuretic Peptide: 36 pg/mL (ref 0.0–100.0)

## 2017-11-13 MED ORDER — KETOROLAC TROMETHAMINE 30 MG/ML IJ SOLN
30.0000 mg | Freq: Once | INTRAMUSCULAR | Status: AC
  Administered 2017-11-13: 30 mg via INTRAVENOUS
  Filled 2017-11-13: qty 1

## 2017-11-13 MED ORDER — DICLOFENAC SODIUM 1 % TD GEL: 4.0000 g | Freq: Four times a day (QID) | TRANSDERMAL | 0 refills | Status: DC

## 2017-11-13 MED ORDER — LEVOTHYROXINE SODIUM 112 MCG PO TABS: 112.0000 ug | ORAL_TABLET | Freq: Every day | ORAL | 1 refills | Status: DC

## 2017-11-13 NOTE — Discharge Instructions (Signed)
Your testing has been normal, no signs of pneumonia, no signs of lung problems, no signs of heart problems Your EKG did not show any abnormality findings, your pacemaker is working well  Please use the topical medication called Voltaren gel, you may also try lidocaine patches, see your doctor within 2 days for recheck, emergency department for worsening symptoms.

## 2017-11-13 NOTE — ED Provider Notes (Signed)
[facility] EMERGENCY DEPARTMENT Provider Note   CSN: [id number] Arrival date & time: 11/13/17  1400     History   Chief Complaint Chief Complaint  Patient presents with  . Chest Pain    HPI Gary Kirk is a 52 y.o. male.  HPI  52 year old male, he has a known history of an automatic defibrillator, pacemaker that was placed in 2015, history of COPD status post tobacco use, has quit smoking, he has an echocardiogram that showed an ejection fraction that was very low at 15% in 2015 and has a known history of a nonischemic car myopathy after a heart catheterization which she reports showed normal coronary arteries.  His last ejection fraction, reported through the literature was 28% in October 2015.  He presents today with a complaint of 3 days of chest discomfort it is mostly on the left side and seems to wrap around from his anterior lower chest on the left around the left side to the left flank and mid back.  This is worse with breathing, he has occasional coughing which does make it worse, he has pain with movement, palpation, rotation and bending at the hips.  There is no fevers, minimal coughing, no swelling of the legs.  He does report that he has had some increasing shortness of breath with exertion recently but denies any swelling of the legs.  He has a heart doctor, he sees the [facility] and had been seen in [place] prior to moving to this area.  He has not yet established with a local cardiologist.  The patient has had no medication for pain prior to arrival.  He has been taking his Eliquis as prescribed, he has run out of his hypothyroid medications over the weekend.  He reports that his pain is been constant for 24 hours  Past Medical History:  Diagnosis Date  . AICD (automatic cardioverter/defibrillator) present    a. 10/2013 s/p MDT [id number] BiV ICD (ser# [id number]).  . COPD (chronic obstructive pulmonary disease) ([place])    a. Quit  smoking in 2015.  Marland Kitchen HFrEF (heart failure with reduced ejection fraction) ([place])    a. 02/2012 Echo: EF "depressed";  b. 03/2013 Echo: Ef 15%, mod to sev LV dil w/ anteroseptal AK and sev glob HK. Dilated RV w/ reduced fxn. Mild to mod RAE, Sev LAE. Mild TR; c. 11/2013 Echo: EF 10-15%, redcued RV fxn, mild BAE, mild TR.  Marland Kitchen History of ileus   . Hypertension   . Hypokalemia   . Hypothyroidism   . Major depression   . NICM (nonischemic cardiomyopathy) ([place])    a. 02/2012 Echo: depressed EF; b. 02/2012 MV: EF 38%, small fixed apical defect, no ischemia; c. 03/2013 Echo: EF 15%; d. 08/2013 MV: apical ant, septal inf fixed defects, no ischemia; e. 10/2013 s/p MDT [id number] BiV ICD; d. 11/2013 Cath: results not avail, reportedly nl cors; e. 09/2014 MV: Fixed inf defect consistent w/ diaphragmatic atten. No ischemia. EF 28%.  . Permanent atrial fibrillation ([facility])    a. CHA2DS2VASc = 2-->Eliquis.  . Sleep apnea    a. Doesn't tolerate CPAP.    Patient Active Problem List   Diagnosis Date Noted  . Morbid obesity ([place]) 05/23/2017  . ICD (implantable cardioverter-defibrillator) in place 05/23/2017  . Acute on chronic combined systolic and diastolic CHF (congestive heart failure) ([place])   . Low TSH level   . Chest pain 05/09/2016  . Pneumonia 05/09/2016  . Hypothyroidism 05/09/2016  .  Nausea 05/09/2016  . Hypokalemia 05/09/2016  . Ileus ([place]) 05/09/2016  . Non-ischemic cardiomyopathy ([place])   . Prolonged Q-T interval on ECG   . Creatinine elevation   . Severe episode of recurrent major depressive disorder, without psychotic features ([place])   . Severe recurrent major depression without psychotic features ([place]) 05/24/2015  . MDD (major depressive disorder) 05/24/2015  . MDD (major depressive disorder), recurrent episode, severe ([place]) 05/24/2015  . Epigastric pain 03/19/2014  . Right upper quadrant abdominal pain 03/19/2014  . ICD (implantable cardioverter-defibrillator) discharge 12/01/2013  . CKD (chronic  kidney disease), stage II 09/26/2013  . LBBB (left bundle branch block) 09/26/2013  . Medical non-compliance 09/26/2013  . 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 Polysubstance abuse ([place]) 09/26/2013  . Respiratory failure with hypoxia ([place]) 04/04/2013  . Fatigue 04/03/2013  . Palliative care encounter 04/03/2013  . Shock liver 04/03/2013  . Abdominal pain 04/01/2013  . Chronic obstructive pulmonary disease ([place]) 04/01/2013  . Gout 04/01/2013  . Hypertension 04/01/2013  . Chronic systolic congestive heart failure ([place]) 04/01/2013  . Marijuana use 04/01/2013  . Obstructive sleep apnea on CPAP 04/01/2013  . Persistent atrial fibrillation ([place]) 04/01/2013  . Tobacco abuse 04/01/2013    Past Surgical History:  Procedure Laterality Date  . CARDIAC DEFIBRILLATOR PLACEMENT    . PACEMAKER IMPLANT          Home Medications    Prior to Admission medications   Medication Sig Start Date End Date Taking? Authorizing Provider  apixaban (ELIQUIS) 5 MG TABS tablet Take 1 tablet (5 mg total) by mouth 2 (two) times daily. 06/12/17   Deboraha Sprang, MD  carvedilol (COREG) 6.25 MG tablet Take 1 tablet (6.25 mg total) by mouth 2 (two) times daily with a meal. 06/12/17   Deboraha Sprang, MD  gabapentin (NEURONTIN) 300 MG capsule Take 300 mg by mouth 2 (two) times daily.    [provider]  levothyroxine (SYNTHROID, LEVOTHROID) 112 MCG tablet Take 112 mcg by mouth daily before breakfast.    [provider]  losartan (COZAAR) 25 MG tablet Take 1 tablet (25 mg total) by mouth daily. 06/12/17 09/10/17  Deboraha Sprang, MD  spironolactone (ALDACTONE) 25 MG tablet Take 1 tablet (25 mg total) by mouth daily. 07/10/17 10/08/17  Theora Gianotti, NP    Family History Family History  Problem Relation Age of Onset  . COPD Mother        decsd 2008  . Hypertension Mother   . Atrial fibrillation Mother   . Heart failure Mother   . Cancer Father        unknown cancer,  died when patient was 57  . Lung cancer Brother   . Atrial fibrillation Brother     Social History Social History   Tobacco Use  . Smoking status: Former Smoker    Types: Cigarettes    Last attempt to quit: 04/29/2014    Years since quitting: 3.5  . Smokeless tobacco: Never Used  Substance Use Topics  . Alcohol use: No  . Drug use: Yes    Types: Marijuana    Comment: used today 05/23/2017 at 6AM     Allergies   Aripiprazole; Tramadol; and Ibuprofen   Review of Systems Review of Systems  Constitutional: Negative for chills and fever.  HENT: Negative for sore throat.   Eyes: Negative for visual disturbance.  Respiratory: Positive for shortness of breath. Negative for cough.   Cardiovascular: Positive for chest pain.  Gastrointestinal:  Negative for abdominal pain, diarrhea, nausea and vomiting.  Genitourinary: Negative for dysuria and frequency.  Musculoskeletal: Negative for back pain and neck pain.  Skin: Negative for rash.  Neurological: Negative for weakness, numbness and headaches.  Hematological: Negative for adenopathy.  Psychiatric/Behavioral: Negative for behavioral problems.     Physical Exam Updated Vital Signs BP 124/79 (BP Location: Left Arm)   Pulse 92   Temp 97.9 F (36.6 C) (Oral)   Resp 20   Ht 1.829 m (6')   Wt (!) 140.6 kg   SpO2 98%   BMI 42.04 kg/m   Physical Exam  Constitutional: He appears well-developed and well-nourished. No distress.  HENT:  Head: Normocephalic and atraumatic.  Mouth/Throat: Oropharynx is clear and moist. No oropharyngeal exudate.  Eyes: Pupils are equal, round, and reactive to light. Conjunctivae and EOM are normal. Right eye exhibits no discharge. Left eye exhibits no discharge. No scleral icterus.  Neck: Normal range of motion. Neck supple. No JVD present. No thyromegaly present.  Cardiovascular: Normal rate, regular rhythm, normal heart sounds and intact distal pulses. Exam reveals no gallop and no friction rub.    No murmur heard. The patient has strong artery pulses at the radial arteries, no edema, no JVD, normal heart sounds, controlled rate  Pulmonary/Chest: Effort normal and breath sounds normal. No respiratory distress. He has no wheezes. He has no rales. He exhibits tenderness ( There is reproducible tenderness over the chest wall including the lower ribs on the left side around the left side to the back.  This also extends up into the axilla and down onto the left lower abdomen, the pain is mild, there is no guarding).  Lung sounds are normal, unlabored, no wheezing or rales, speaks in full sentences  Abdominal: Soft. Bowel sounds are normal. He exhibits no distension and no mass. There is no tenderness.  Musculoskeletal: Normal range of motion. He exhibits no edema or tenderness.  Lymphadenopathy:    He has no cervical adenopathy.  Neurological: He is alert. Coordination normal.  Skin: Skin is warm and dry. No rash noted. No erythema.  There is no rash seen on the skin and the site of the patient's pain  Psychiatric: He has a normal mood and affect. His behavior is normal.  Nursing note and vitals reviewed.    ED Treatments / Results  Labs (all labs ordered are listed, but only abnormal results are displayed) Labs Reviewed  BASIC METABOLIC PANEL  CBC  BRAIN NATRIURETIC PEPTIDE  TROPONIN I    EKG EKG Interpretation  Date/Time:  Tuesday November 13 2017 14:05:02 EDT Ventricular Rate:  88 PR Interval:    QRS Duration: 168 QT Interval:  430 QTC Calculation: 520 R Axis:   -97 Text Interpretation:  Ventricular-paced rhythm Abnormal ECG since last tracing no significant change Confirmed by Noemi Chapel [phone]) on 11/13/2017 3:01:05 PM   Radiology Dg Chest 2 View  Result Date: 11/13/2017 CLINICAL DATA:  Chest pain EXAM: CHEST - 2 VIEW COMPARISON:  05/30/2016 FINDINGS: Left-sided pacing device similar in position. Non inclusion of the CP angles and lung bases on frontal view. No  focal airspace disease or obvious effusion. Stable cardiomediastinal silhouette. Bronchitic changes. No pneumothorax. IMPRESSION: Non inclusion of the lung bases on frontal view. No radiographic evidence for acute cardiopulmonary abnormality. Electronically Signed   By: Donavan Foil M.D.   On: 11/13/2017 14:48    Procedures Procedures (including critical care time)  Medications Ordered in ED Medications - No data to display  Initial Impression / Assessment and Plan / ED Course  I have reviewed the triage vital signs and the nursing notes.  Pertinent labs & imaging results that were available during my care of the patient were reviewed by me and considered in my medical decision making (see chart for details).  Clinical Course as of Nov 13 1717  Tue Nov 13, 2017  1716 No evidence of pneumonia on chest x-ray, no evidence of elevated troponin, he has had pain for over 24 hours without change, without relief, worsening with movement and palpation, likely musculoskeletal, patient given reassurance, stable for discharge   [BM]    Clinical Course User Index [BM] Noemi Chapel, MD   The patient's EKG shows a paced rhythm, there is no changes from prior, his chest x-ray shows no signs of pneumonia, he is concerned that he may have had pneumonia since he did have similar pain to this in 2015 when he had pneumonia.  He is not coughing or having fevers anymore than usual with the coughing.  We will get labs as well, evaluate for cardiac ischemia though this seems to be low risk given his prior catheterization history though this is just per his report and what is listed on the medical record.  This may just be chest wall pain  Final Clinical Impressions(s) / ED Diagnoses   Final diagnoses:  Chest pain, unspecified type      Noemi Chapel, MD 11/13/17 1719

## 2017-11-13 NOTE — ED Triage Notes (Signed)
Pt c/o of left sided chest pain that radiates to neck and back x 4 days.

## 2017-12-18 ENCOUNTER — Ambulatory Visit (INDEPENDENT_AMBULATORY_CARE_PROVIDER_SITE_OTHER): Payer: Medicaid Other | Admitting: *Deleted

## 2017-12-18 ENCOUNTER — Telehealth: Payer: Self-pay | Admitting: Cardiology

## 2017-12-18 DIAGNOSIS — I5022 Chronic systolic (congestive) heart failure: Secondary | ICD-10-CM

## 2017-12-18 DIAGNOSIS — I428 Other cardiomyopathies: Secondary | ICD-10-CM

## 2017-12-18 NOTE — Telephone Encounter (Signed)
Spoke with pt and reminded pt of remote transmission that is due today. Pt verbalized understanding.   

## 2017-12-18 NOTE — Progress Notes (Signed)
Remote ICD transmission.   

## 2017-12-19 ENCOUNTER — Encounter: Payer: Self-pay | Admitting: Cardiology

## 2017-12-26 ENCOUNTER — Encounter (HOSPITAL_COMMUNITY): Payer: Self-pay | Admitting: Emergency Medicine

## 2017-12-26 ENCOUNTER — Emergency Department (HOSPITAL_COMMUNITY)
Admission: EM | Admit: 2017-12-26 | Discharge: 2017-12-26 | Disposition: A | Discharge status: Home / Self Care | Payer: Medicaid Other | Attending: Emergency Medicine | Admitting: Emergency Medicine

## 2017-12-26 DIAGNOSIS — N182 Chronic kidney disease, stage 2 (mild): Secondary | ICD-10-CM | POA: Insufficient documentation

## 2017-12-26 DIAGNOSIS — I5043 Acute on chronic combined systolic (congestive) and diastolic (congestive) heart failure: Secondary | ICD-10-CM | POA: Insufficient documentation

## 2017-12-26 DIAGNOSIS — R1010 Upper abdominal pain, unspecified: Secondary | ICD-10-CM | POA: Diagnosis present

## 2017-12-26 DIAGNOSIS — I519 Heart disease, unspecified: Secondary | ICD-10-CM

## 2017-12-26 DIAGNOSIS — I4891 Unspecified atrial fibrillation: Secondary | ICD-10-CM | POA: Diagnosis not present

## 2017-12-26 DIAGNOSIS — Z87891 Personal history of nicotine dependence: Secondary | ICD-10-CM | POA: Insufficient documentation

## 2017-12-26 DIAGNOSIS — I130 Hypertensive heart and chronic kidney disease with heart failure and stage 1 through stage 4 chronic kidney disease, or unspecified chronic kidney disease: Secondary | ICD-10-CM | POA: Diagnosis not present

## 2017-12-26 DIAGNOSIS — E039 Hypothyroidism, unspecified: Secondary | ICD-10-CM | POA: Diagnosis not present

## 2017-12-26 DIAGNOSIS — Z79899 Other long term (current) drug therapy: Secondary | ICD-10-CM | POA: Insufficient documentation

## 2017-12-26 DIAGNOSIS — I480 Paroxysmal atrial fibrillation: Secondary | ICD-10-CM | POA: Insufficient documentation

## 2017-12-26 DIAGNOSIS — J449 Chronic obstructive pulmonary disease, unspecified: Secondary | ICD-10-CM | POA: Insufficient documentation

## 2017-12-26 DIAGNOSIS — Z4502 Encounter for adjustment and management of automatic implantable cardiac defibrillator: Secondary | ICD-10-CM | POA: Insufficient documentation

## 2017-12-26 LAB — BRAIN NATRIURETIC PEPTIDE: B Natriuretic Peptide: 23 pg/mL (ref 0.0–100.0)

## 2017-12-26 LAB — COMPREHENSIVE METABOLIC PANEL
ALT: 16 U/L (ref 0–44)
ANION GAP: 13 (ref 5–15)
AST: 17 U/L (ref 15–41)
Albumin: 3.8 g/dL (ref 3.5–5.0)
Alkaline Phosphatase: 53 U/L (ref 38–126)
BUN: 12 mg/dL (ref 6–20)
CALCIUM: 9.4 mg/dL (ref 8.9–10.3)
CO2: 18 mmol/L — ABNORMAL LOW (ref 22–32)
Chloride: 107 mmol/L (ref 98–111)
Creatinine, Ser: 1.11 mg/dL (ref 0.61–1.24)
GFR calc Af Amer: >60 mL/min (ref >60)
GLUCOSE: 95 mg/dL (ref 70–99)
POTASSIUM: 4.3 mmol/L (ref 3.5–5.1)
Sodium: 138 mmol/L (ref 135–145)
TOTAL PROTEIN: 6.7 g/dL (ref 6.5–8.1)
Total Bilirubin: 0.6 mg/dL (ref 0.3–1.2)

## 2017-12-26 LAB — CBC WITH DIFFERENTIAL/PLATELET
Abs Immature Granulocytes: 0.05 10*3/uL (ref 0.00–0.07)
BASOS ABS: 0.1 10*3/uL (ref 0.0–0.1)
BASOS PCT: 1 %
EOS ABS: 0.2 10*3/uL (ref 0.0–0.5)
Eosinophils Relative: 2 %
HEMATOCRIT: 46.9 % (ref 39.0–52.0)
Hemoglobin: 15.4 g/dL (ref 13.0–17.0)
IMMATURE GRANULOCYTES: 0 %
LYMPHS ABS: 1.6 10*3/uL (ref 0.7–4.0)
Lymphocytes Relative: 14 %
MCH: 31.5 pg (ref 26.0–34.0)
MCHC: 32.8 g/dL (ref 30.0–36.0)
MCV: 95.9 fL (ref 80.0–100.0)
MONOS PCT: 7 %
Monocytes Absolute: 0.8 10*3/uL (ref 0.1–1.0)
NEUTROS PCT: 76 %
NRBC: 0 % (ref 0.0–0.2)
Neutro Abs: 8.6 10*3/uL — ABNORMAL HIGH (ref 1.7–7.7)
PLATELETS: 171 10*3/uL (ref 150–400)
RBC: 4.89 MIL/uL (ref 4.22–5.81)
RDW: 12.5 % (ref 11.5–15.5)
WBC: 11.4 10*3/uL — ABNORMAL HIGH (ref 4.0–10.5)

## 2017-12-26 LAB — TROPONIN I

## 2017-12-26 MED ORDER — KETOROLAC TROMETHAMINE 15 MG/ML IJ SOLN
15.0000 mg | Freq: Once | INTRAMUSCULAR | Status: AC
  Administered 2017-12-26: 15 mg via INTRAVENOUS
  Filled 2017-12-26: qty 1

## 2017-12-26 MED ORDER — ONDANSETRON HCL 4 MG/2ML IJ SOLN
4.0000 mg | Freq: Once | INTRAMUSCULAR | Status: AC
  Administered 2017-12-26: 4 mg via INTRAVENOUS
  Filled 2017-12-26: qty 2

## 2017-12-26 MED ORDER — ACETAMINOPHEN 500 MG PO TABS
500.0000 mg | ORAL_TABLET | Freq: Once | ORAL | Status: AC
  Administered 2017-12-26: 500 mg via ORAL
  Filled 2017-12-26: qty 1

## 2017-12-26 MED ORDER — CARVEDILOL 12.5 MG PO TABS: 12.5000 mg | ORAL_TABLET | Freq: Two times a day (BID) | ORAL | 6 refills | Status: DC

## 2017-12-26 NOTE — Consult Note (Addendum)
Cardiology Consultation:   Patient ID: [name] MRN: [id number]; DOB: 08/06/1965  Admit date: 12/26/2017 Date of Consult: 12/26/2017  Primary Care Provider: [name], FNP Primary Cardiologist: Gary Rogue, MD  Primary Electrophysiologist:  Dr. Caryl Kirk   Patient Profile:   Gary Kirk is a 52 y.o. male with a hx of NICM w/ICD, HTN, hypothyroidism, OSA (intolerant of CPAP), COPD, depression, regular marijuana use (daily) and AFib who is being seen today for the evaluation of ICD shock, and device battery delepletion at the request of Dr. Vanita Panda.  Device information: MDT CRT-D, implanted 10/31/13  Interrogation: ([facility]) Battery estimate 13 months Lead measurements look Ok 100% AF Optivol looks OK  Brady pacing programmed at 80bpm w/ 92.8% VP  One VF episode VF episode w/V rate 200bpm, 2 shocks, looks like rapid AFib  History of Present Illness:   [name] past medical history including nonischemic cardiomyopathy and HFrEF dating back to at least January 2014, with subsequent work-up and management at [facility] in [place], involving multiple nonischemic stress tests, biventricular ICD placement in September 2015, and reported catheterization in October 2015.  Last stress test was in August 2016, revealing a fixed inferior defect consistent with diaphragmatic attenuation.  No ischemia.  He saw Gary Kirk for cardiology 07/10/17, his most recent visit, doing well, meds adjusted to add spironolactone for CM/HF management.  He had an ER visit in Sept that was felt to be chest wall in etiology, labs unremarkable with one Trop <0.03  He previously lived in [place] moved to [place] and established care with Dr. Rockey Kirk and Dr. Caryl Kirk.  He saw Dr. Caryl Kirk in April noting reported hx of multiple shocks 2/2 AFib with intermittent cardiac follow up historically, there was discussion about consideration of AV node ablation, I don't see that this was planned for.  He came to  the ER today after being shocked by his device x2. Dr. Caryl Kirk reports last known LVEF 20% in 2016  He reports having been grocery shopping, rushing a bit with family waiting in him, feelng well.  He went to grab his eggs to put in the truck and was shocked.  No pre-shock symptoms.  He felt OK immediately sat on the ground recalling several years ago getting shocked he reports 10 x's for his AF.  He did get a second shock several seconds later.  Again without symptoms.  He was alarmed, felt a little clammy and sick to his stomach after the 2nd shock but well otherwise.  He has felt well since as well.  He has not had any kind of CP, palpitations or cardiac awareness, has been able to do his ADls without difficulty.  He reports compliance with his medicines, infrequently misses a dose of them but not of late.  He reports good compliance with his Eliquis.  LABS K+ 4.3 BUN/Creat 12/1.11 BNP 23 Trop I: <0.03 WBC 11.4 H/H 15/46 Plts 171   Past Medical History:  Diagnosis Date  . AICD (automatic cardioverter/defibrillator) present    a. 10/2013 s/p MDT [id number] BiV ICD (ser# [id number]).  . COPD (chronic obstructive pulmonary disease) ([place])    a. Quit smoking in 2015.  Marland Kitchen HFrEF (heart failure with reduced ejection fraction) ([place])    a. 02/2012 Echo: EF "depressed";  b. 03/2013 Echo: Ef 15%, mod to sev LV dil w/ anteroseptal AK and sev glob HK. Dilated RV w/ reduced fxn. Mild to mod RAE, Sev LAE. Mild TR; c. 11/2013 Echo: EF 10-15%, redcued RV  fxn, mild BAE, mild TR.  Marland Kitchen History of ileus   . Hypertension   . Hypokalemia   . Hypothyroidism   . Major depression   . NICM (nonischemic cardiomyopathy) ([place])    a. 02/2012 Echo: depressed EF; b. 02/2012 MV: EF 38%, small fixed apical defect, no ischemia; c. 03/2013 Echo: EF 15%; d. 08/2013 MV: apical ant, septal inf fixed defects, no ischemia; e. 10/2013 s/p MDT [id number] BiV ICD; d. 11/2013 Cath: results not avail, reportedly nl cors; e. 09/2014 MV: Fixed inf defect  consistent w/ diaphragmatic atten. No ischemia. EF 28%.  . Permanent atrial fibrillation    a. CHA2DS2VASc = 2-->Eliquis.  . Sleep apnea    a. Doesn't tolerate CPAP.    Past Surgical History:  Procedure Laterality Date  . CARDIAC DEFIBRILLATOR PLACEMENT    . PACEMAKER IMPLANT       Home Medications:  Prior to Admission medications   Medication Sig Start Date End Date Taking? Authorizing Provider  apixaban (ELIQUIS) 5 MG TABS tablet Take 1 tablet (5 mg total) by mouth 2 (two) times daily. 06/12/17  Yes Deboraha Sprang, MD  carvedilol (COREG) 6.25 MG tablet Take 1 tablet (6.25 mg total) by mouth 2 (two) times daily with a meal. 06/12/17  Yes Deboraha Sprang, MD  gabapentin (NEURONTIN) 300 MG capsule Take 300 mg by mouth 2 (two) times daily.   Yes [provider]  levothyroxine (SYNTHROID) 112 MCG tablet Take 1 tablet (112 mcg total) by mouth daily before breakfast. 11/13/17  Yes Noemi Chapel, MD  PROAIR HFA 108 [phone] Base) MCG/ACT inhaler Inhale 2 puffs into the lungs every 4 (four) hours as needed for wheezing or shortness of breath.  10/19/17  Yes [provider]  spironolactone (ALDACTONE) 25 MG tablet Take 1 tablet (25 mg total) by mouth daily. 07/10/17 12/26/17 Yes Theora Gianotti, NP  diclofenac sodium (VOLTAREN) 1 % GEL Apply 4 g topically 4 (four) times daily. Patient not taking: Reported on 12/26/2017 11/13/17   Noemi Chapel, MD  losartan (COZAAR) 25 MG tablet Take 1 tablet (25 mg total) by mouth daily. 06/12/17 11/13/17  Deboraha Sprang, MD    Inpatient Medications: Scheduled Meds:  Continuous Infusions:  PRN Meds:   Allergies:    Allergies  Allergen Reactions  . Aripiprazole Other (See Comments) and Anaphylaxis    Had negative side effects (on him) Had negative side effects (on him) Had negative side effects (on him)  . Tramadol Shortness Of Breath and Nausea Only    And, flu-like symptoms Pt states he has "flu type symptoms" after receiving  tramadol And, flu-like symptoms And, flu-like symptoms Pt states he has "flu type symptoms" after receiving tramadol  . Ibuprofen Other (See Comments)    Spits up blood "Bleeding ulcers" per pt Spits up blood Spits up blood "Bleeding ulcers" per pt    Social History:   Social History   Socioeconomic History  . Marital status: Legally Separated    Spouse name: Not on file  . Number of children: Not on file  . Years of education: Not on file  . Highest education level: Not on file  Occupational History  . Not on file  Social Needs  . Financial resource strain: Not on file  . Food insecurity:    Worry: Not on file    Inability: Not on file  . Transportation needs:    Medical: Not on file    Non-medical: Not on file  Tobacco Use  .  Smoking status: Former Smoker    Types: Cigarettes    Last attempt to quit: 04/29/2014    Years since quitting: 3.6  . Smokeless tobacco: Never Used  Substance and Sexual Activity  . Alcohol use: No  . Drug use: Yes    Types: Marijuana    Comment: used today 05/23/2017 at 6AM  . Sexual activity: Not on file  Lifestyle  . Physical activity:    Days per week: Not on file    Minutes per session: Not on file  . Stress: Not on file  Relationships  . Social connections:    Talks on phone: Not on file    Gets together: Not on file    Attends religious service: Not on file    Active member of club or organization: Not on file    Attends meetings of clubs or organizations: Not on file    Relationship status: Not on file  . Intimate partner violence:    Fear of current or ex partner: Not on file    Emotionally abused: Not on file    Physically abused: Not on file    Forced sexual activity: Not on file  Other Topics Concern  . Not on file  Social History Narrative  . Not on file    Family History:   Family History  Problem Relation Age of Onset  . COPD Mother        decsd 2008  . Hypertension Mother   . Atrial fibrillation Mother   .  Heart failure Mother   . Cancer Father        unknown cancer, died when patient was 85  . Lung cancer Brother   . Atrial fibrillation Brother      ROS:  Please see the history of present illness.  All other ROS reviewed and negative.     Physical Exam/Data:   Vitals:   12/26/17 1030 12/26/17 1100 12/26/17 1130 12/26/17 1145  BP: 128/85 133/80 121/86 114/80  Pulse: 80 80 81 84  Resp: 20 14 19  (!) 22  Temp:      TempSrc:      SpO2: 100% 99% 98% 98%   No intake or output data in the 24 hours ending 12/26/17 1333 There were no vitals filed for this visit. There is no height or weight on file to calculate BMI.  General:  Well nourished, well developed, in no acute distress HEENT: normal Lymph: no adenopathy Neck: no JVD Endocrine:  No thryomegaly Vascular: No carotid bruits  Cardiac:  RRR; soft SM, no gallops or rubs Lungs:  CTA b/l, no wheezing, rhonchi or rales  Abd: soft, nontender, obese Ext: no edema Musculoskeletal:  No deformities Skin: warm and dry  Neuro: no focal abnormalities noted Psych:  Normal affect, laughing, in good spirits, very pleasent  EKG:  The EKG was personally reviewed and demonstrates:   SR, V pacing Telemetry:  Telemetry was personally reviewed and demonstrates:   SR, intermittently V pacing overrides sinus rates  Relevant CV Studies:  12/02/13: TTE IMPRESSIONS  Left ventricular dilatation. Left ventricular wall thickness mildly increased. Global left ventricular hypokinesis. Severely reduced  global left ventricular systolic function. Severely abnormal left ventricular ejection fraction estimated at 10-15%.  Catheter/pacemaker wire in the right ventricular cavity.  Mild right ventricular dilatation. Reduced right ventricular global systolic function.  Catheter/pacemaker wire in the right atrial cavity.  Mild right atrial dilatation. Area 19cm2.  Mild left atrial dilatation.  Normal valves.  Mild tricuspid regurgitation.  Estimated PASP  4mmHg.  Normal pericardium. No pericardial effusion.  Normal size aortic root and proximal ascending aorta. Dilated IVC with decreased respiratory variation.  FINDINGS  Left Ventricle Left ventricular dilatation. Left ventricular wall thickness mildly increased. Global left ventricular  hypokinesis. Severely reduced global left ventricular systolic function. Severely abnormal left ventricular  ejection fraction estimated at 10-15%. Septal E/E' ratio is 14.1 indicating normal filling pressure.  Right Ventricle Catheter/pacemaker wire in the right ventricular cavity.  Mild right ventricular dilatation. Reduced right ventricular global systolic function.  Right Atrium Catheter/pacemaker wire in the right atrial cavity.  Mild right atrial dilatation. Area 19cm2  Left Atrium Mild left atrial dilatation. Area 23cm2  Mitral Valve Normal function and mobility of the mitral valve leaflets. Trace mitral regurgitation.  Aortic Valve Structurally normal trileaflet aortic valve. No aortic regurgitation.  Tricuspid Valve Structurally normal tricuspid valve. Mild tricuspid regurgitation. Estimated PASP 67mmHg.  Pulmonic Valve Structurally normal pulmonic valve.  Pericardium Normal pericardium. No pericardial effusion.  Vessels Normal size aortic root and proximal ascending aorta. Dilated IVC with decreased respiratory variation  Laboratory Data:  ChemistryNo results for input(s): NA, K, CL, CO2, GLUCOSE, BUN, CREATININE, CALCIUM, GFRNONAA, GFRAA, ANIONGAP in the last 168 hours.  No results for input(s): PROT, ALBUMIN, AST, ALT, ALKPHOS, BILITOT in the last 168 hours. Hematology Recent Labs  Lab 12/26/17 1145  WBC 11.4*  RBC 4.89  HGB 15.4  HCT 46.9  MCV 95.9  MCH 31.5  MCHC 32.8  RDW 12.5  PLT 171   Cardiac EnzymesNo results for input(s): TROPONINI in the last 168 hours. No results for input(s): TROPIPOC in the last 168 hours.  BNPNo results for input(s): BNP, PROBNP in the last 168 hours.    DDimer No results for input(s): DDIMER in the last 168 hours.  Radiology/Studies:  No results found.  Assessment and Plan:   1. ICD shcoks     Interrogation reviewed, looks like rapid AFib      2. Permanent AFib     CHA2DS2Vasc is 2, on Eliquis, appropriately dosed     He is now in SR, post shocks  3. NICM     On BB/ARB, diuretic tx     No symptoms, exam findings to suggest fluid OL, Optivol looks OK   I anticipate he will be able to go home Dr. Rayann Heman will see later today Will review, suggest device reprogramming to DDD 60, allow SR and increase his coreg with out patient follow up.    For questions or updates, please contact [place] Please consult [email or web address] for contact info under     Signed, Baldwin Jamaica, PA-C  12/26/2017 1:33 PM   I have seen, examined the patient, and reviewed the above assessment and plan.  Changes to above are made where necessary.  On exam, RRR.  Pt presents with inappropriate ICD shock for AF with RVR.  Reports noncompliance with coreg at times.  I have advised compliance and will increase coreg to 12.5mg  BID.  ICD interrogation is personally reviewed at length.  He is programmed today DDD as he has converted from AF to sinus.  He had a single VF zone at 180 bpm.  I have increased this to 214 bpm to hopefully minimize inappropriate shocks.  Ultimately, he may benefit from AV nodal ablation.  I will defer this decision to outpatient EP team.  He has previously followed with Dr Gary Kirk but has recently moved to [facility] and would like to have follow-up with Dr  Lovena Le there.  No driving until cleared by Dr Lovena Le.  OK to discharge to home at this time.  Co Sign: Thompson Grayer, MD 12/26/2017 9:31 PM

## 2017-12-26 NOTE — ED Provider Notes (Signed)
Cardiology evaluated the patient and they reprogrammed his pacemaker.  We will increase his Coreg dose to 12.5 mg twice a day.  Has cardiology follow-up and was discharged from ED in good condition.   Lennice Sites, DO 12/26/17 1756

## 2017-12-26 NOTE — Discharge Instructions (Signed)
Please be sure to follow-up with your cardiologist, or return here for any concerning changes in your condition.  You will be called by one of Dr. Lovena Le (cardiologist/defibrillator specialist) for follow up to be seen in the next 1-2 weeks.  As discussed will arrange for him to follow going forward given [place] is closer for you If you are not called by the end of this week to schedule, please call the office.

## 2017-12-26 NOTE — ED Triage Notes (Signed)
Pt here from grocery store loading up \\his  car when he was shocked twice by his device , pt received Zofran for nausea and 324mg   Diona Fanti

## 2017-12-26 NOTE — ED Notes (Signed)
Taxi called to be transferred to  [facility]

## 2017-12-26 NOTE — ED Provider Notes (Addendum)
[facility] EMERGENCY DEPARTMENT Provider Note   CSN: [id number] Arrival date & time: 12/26/17  1001     History   Chief Complaint Chief Complaint  Patient presents with  . AICD Problem    HPI Gary Kirk is a 52 y.o. male.  HPI Patient presents after his implanted cardiac defibrillator fired at least once today. He notes a history of multiple medical issues including COPD, has had defibrillator placed several years ago. He has had prior shock, though none in at least 4 years. Today, he felt mildly generally unwell, but not acutely ill.  He notes some mild upper abdominal discomfort, mild headache, without diffuse abdominal pain, without nausea, vomiting, diarrhea, fever, chills.  He he sustained what seemed to be 2 independent shocks, separated by a few moments.  On since that time he has had soreness in his chest, but has no new dyspnea, or other chest pain. No recent medication change, diet change, activity change.   Past Medical History:  Diagnosis Date  . AICD (automatic cardioverter/defibrillator) present    a. 10/2013 s/p MDT [id number] BiV ICD (ser# [id number] H).  . COPD (chronic obstructive pulmonary disease) ([place])    a. Quit smoking in 2015.  Marland Kitchen HFrEF (heart failure with reduced ejection fraction) ([place])    a. 02/2012 Echo: EF "depressed";  b. 03/2013 Echo: Ef 15%, mod to sev LV dil w/ anteroseptal AK and sev glob HK. Dilated RV w/ reduced fxn. Mild to mod RAE, Sev LAE. Mild TR; c. 11/2013 Echo: EF 10-15%, redcued RV fxn, mild BAE, mild TR.  Marland Kitchen History of ileus   . Hypertension   . Hypokalemia   . Hypothyroidism   . Major depression   . NICM (nonischemic cardiomyopathy) ([place])    a. 02/2012 Echo: depressed EF; b. 02/2012 MV: EF 38%, small fixed apical defect, no ischemia; c. 03/2013 Echo: EF 15%; d. 08/2013 MV: apical ant, septal inf fixed defects, no ischemia; e. 10/2013 s/p MDT [id number] BiV ICD; d. 11/2013 Cath: results not avail, reportedly nl cors; e.  09/2014 MV: Fixed inf defect consistent w/ diaphragmatic atten. No ischemia. EF 28%.  . Permanent atrial fibrillation    a. CHA2DS2VASc = 2-->Eliquis.  . Sleep apnea    a. Doesn't tolerate CPAP.    Patient Active Problem List   Diagnosis Date Noted  . Morbid obesity ([place]) 05/23/2017  . ICD (implantable cardioverter-defibrillator) in place 05/23/2017  . Acute on chronic combined systolic and diastolic CHF (congestive heart failure) ([place])   . Low TSH level   . Chest pain 05/09/2016  . Pneumonia 05/09/2016  . Hypothyroidism 05/09/2016  . Nausea 05/09/2016  . Hypokalemia 05/09/2016  . Ileus ([place]) 05/09/2016  . Non-ischemic cardiomyopathy ([place])   . Prolonged Q-T interval on ECG   . Creatinine elevation   . Severe episode of recurrent major depressive disorder, without psychotic features ([place])   . Severe recurrent major depression without psychotic features ([place]) 05/24/2015  . MDD (major depressive disorder) 05/24/2015  . MDD (major depressive disorder), recurrent episode, severe ([place]) 05/24/2015  . Epigastric pain 03/19/2014  . Right upper quadrant abdominal pain 03/19/2014  . ICD (implantable cardioverter-defibrillator) discharge 12/01/2013  . CKD (chronic kidney disease), stage II 09/26/2013  . LBBB (left bundle branch block) 09/26/2013  . Medical non-compliance 09/26/2013  . 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 Polysubstance abuse ([place]) 09/26/2013  . Respiratory failure with hypoxia ([place]) 04/04/2013  . Fatigue 04/03/2013  . Palliative care encounter 04/03/2013  . Shock  liver 04/03/2013  . Abdominal pain 04/01/2013  . Chronic obstructive pulmonary disease ([place]) 04/01/2013  . Gout 04/01/2013  . Hypertension 04/01/2013  . Chronic systolic congestive heart failure ([place]) 04/01/2013  . Marijuana use 04/01/2013  . Obstructive sleep apnea on CPAP 04/01/2013  . Persistent atrial fibrillation 04/01/2013  . Tobacco abuse 04/01/2013    Past Surgical History:    Procedure Laterality Date  . CARDIAC DEFIBRILLATOR PLACEMENT    . PACEMAKER IMPLANT          Home Medications    Prior to Admission medications   Medication Sig Start Date End Date Taking? Authorizing Provider  apixaban (ELIQUIS) 5 MG TABS tablet Take 1 tablet (5 mg total) by mouth 2 (two) times daily. 06/12/17  Yes Deboraha Sprang, MD  carvedilol (COREG) 6.25 MG tablet Take 1 tablet (6.25 mg total) by mouth 2 (two) times daily with a meal. 06/12/17  Yes Deboraha Sprang, MD  gabapentin (NEURONTIN) 300 MG capsule Take 300 mg by mouth 2 (two) times daily.   Yes [provider]  levothyroxine (SYNTHROID) 112 MCG tablet Take 1 tablet (112 mcg total) by mouth daily before breakfast. 11/13/17  Yes Noemi Chapel, MD  PROAIR HFA 108 [phone] Base) MCG/ACT inhaler Inhale 2 puffs into the lungs every 4 (four) hours as needed for wheezing or shortness of breath.  10/19/17  Yes [provider]  spironolactone (ALDACTONE) 25 MG tablet Take 1 tablet (25 mg total) by mouth daily. 07/10/17 12/26/17 Yes Theora Gianotti, NP  diclofenac sodium (VOLTAREN) 1 % GEL Apply 4 g topically 4 (four) times daily. Patient not taking: Reported on 12/26/2017 11/13/17   Noemi Chapel, MD  losartan (COZAAR) 25 MG tablet Take 1 tablet (25 mg total) by mouth daily. 06/12/17 11/13/17  Deboraha Sprang, MD    Family History Family History  Problem Relation Age of Onset  . COPD Mother        decsd 2008  . Hypertension Mother   . Atrial fibrillation Mother   . Heart failure Mother   . Cancer Father        unknown cancer, died when patient was 74  . Lung cancer Brother   . Atrial fibrillation Brother     Social History Social History   Tobacco Use  . Smoking status: Former Smoker    Types: Cigarettes    Last attempt to quit: 04/29/2014    Years since quitting: 3.6  . Smokeless tobacco: Never Used  Substance Use Topics  . Alcohol use: No  . Drug use: Yes    Types: Marijuana    Comment: used  today 05/23/2017 at 6AM     Allergies   Aripiprazole; Tramadol; and Ibuprofen   Review of Systems Review of Systems  Constitutional:       Per HPI, otherwise negative  HENT:       Per HPI, otherwise negative  Respiratory:       Per HPI, otherwise negative  Cardiovascular:       Per HPI, otherwise negative  Gastrointestinal: Negative for vomiting.  Endocrine:       Negative aside from HPI  Genitourinary:       Neg aside from HPI   Musculoskeletal:       Per HPI, otherwise negative  Skin: Negative.   Neurological: Negative for syncope.     Physical Exam Updated Vital Signs BP 114/80   Pulse 84   Temp 98.1 F (36.7 C) (Oral)   Resp Marland Kitchen)  22   SpO2 98%   Physical Exam  Constitutional: He is oriented to person, place, and time. He appears well-developed. No distress.  HENT:  Head: Normocephalic and atraumatic.  Eyes: Conjunctivae and EOM are normal.  Cardiovascular: Normal rate and regular rhythm.  Pulmonary/Chest: Effort normal. No stridor. No respiratory distress.  Abdominal: He exhibits no distension.  Musculoskeletal: He exhibits no edema.  Neurological: He is alert and oriented to person, place, and time.  Skin: Skin is warm and dry.  Psychiatric: He has a normal mood and affect.  Nursing note and vitals reviewed.    ED Treatments / Results  Labs (all labs ordered are listed, but only abnormal results are displayed) Labs Reviewed  COMPREHENSIVE METABOLIC PANEL - Abnormal; Notable for the following components:      Result Value   CO2 18 (*)    All other components within normal limits  CBC WITH DIFFERENTIAL/PLATELET - Abnormal; Notable for the following components:   WBC 11.4 (*)    Neutro Abs 8.6 (*)    All other components within normal limits  TROPONIN I  BRAIN NATRIURETIC PEPTIDE    EKG EKG Interpretation  Date/Time:  Wednesday December 26 2017 10:07:09 EDT Ventricular Rate:  86 PR Interval:    QRS Duration: 153 QT Interval:  423 QTC  Calculation: 503 R Axis:   -110 Text Interpretation:  Sinus rhythm Right bundle branch block Anterolateral infarct, age indeterminate No significant change since last tracing Abnormal ekg Confirmed by Carmin Muskrat [phone]) on 12/26/2017 10:12:25 AM Also confirmed by Carmin Muskrat ([id number]), editor [place], [place] ([id number])  on 12/26/2017 12:04:54 PM   Radiology No results found.  Procedures Procedures (including critical care time)  Medications Ordered in ED Medications  acetaminophen (TYLENOL) tablet 500 mg (500 mg Oral Given 12/26/17 1057)  ondansetron (ZOFRAN) injection 4 mg (4 mg Intravenous Given 12/26/17 1057)  ketorolac (TORADOL) 15 MG/ML injection 15 mg (15 mg Intravenous Given 12/26/17 1145)     Initial Impression / Assessment and Plan / ED Course  I have reviewed the triage vital signs and the nursing notes.  Pertinent labs & imaging results that were available during my care of the patient were reviewed by me and considered in my medical decision making (see chart for details).    After the initial evaluation with the patient's defibrillator interrogated. I discussed findings with the company representative. Pacemaker/fibrillar fired twice, and has abnormally low battery.  Update: Patient in similar condition, no additional complaints.  3:50 PM I reviewed all findings again, and discussed again with her cardiology colleagues was seen and evaluated the patient. Patient will require reprogramming of his defibrillator prior to discharge, and is currently awaiting this process. However, his other findings are reassuring, no evidence for ongoing ischemia, no other sustained pain or complaints, and with suspicion for his arrhythmia contributing to today's episode, but these otherwise reassuring findings, he we should be appropriate for discharge following reprogramming of his device.   Final Clinical Impressions(s) / ED Diagnoses  Atrial fibrillation Discharge of  implanted cardiac device  Carmin Muskrat, MD 12/26/17 1551    Carmin Muskrat, MD 12/26/17 [phone]

## 2018-01-16 ENCOUNTER — Ambulatory Visit (INDEPENDENT_AMBULATORY_CARE_PROVIDER_SITE_OTHER): Payer: Medicaid Other | Admitting: Internal Medicine

## 2018-01-16 ENCOUNTER — Encounter: Payer: Self-pay | Admitting: Internal Medicine

## 2018-01-16 VITALS — BP 132/76 | HR 77 | Ht 72.0 in | Wt 317.0 lb

## 2018-01-16 DIAGNOSIS — I5022 Chronic systolic (congestive) heart failure: Secondary | ICD-10-CM | POA: Diagnosis not present

## 2018-01-16 DIAGNOSIS — I4819 Other persistent atrial fibrillation: Secondary | ICD-10-CM | POA: Diagnosis not present

## 2018-01-16 DIAGNOSIS — I428 Other cardiomyopathies: Secondary | ICD-10-CM | POA: Diagnosis not present

## 2018-01-16 DIAGNOSIS — I447 Left bundle-branch block, unspecified: Secondary | ICD-10-CM

## 2018-01-16 LAB — CUP PACEART INCLINIC DEVICE CHECK
Battery Remaining Longevity: 10 mo
Brady Statistic AP VS Percent: 0.05 %
Brady Statistic AS VS Percent: 0.06 %
Brady Statistic RA Percent Paced: 86.72 %
HighPow Impedance: 86 Ohm
Implantable Lead Implant Date: 20150904
Implantable Lead Implant Date: 20150904
Implantable Lead Location: 753858
Implantable Lead Model: 5076
Implantable Pulse Generator Implant Date: 20150904
Lead Channel Impedance Value: 399 Ohm
Lead Channel Impedance Value: 779 Ohm
Lead Channel Impedance Value: 798 Ohm
Lead Channel Impedance Value: 969 Ohm
Lead Channel Pacing Threshold Amplitude: 1.25 V
Lead Channel Pacing Threshold Pulse Width: 0.4 ms
Lead Channel Pacing Threshold Pulse Width: 0.4 ms
Lead Channel Pacing Threshold Pulse Width: 0.4 ms
Lead Channel Sensing Intrinsic Amplitude: 4.375 mV
Lead Channel Sensing Intrinsic Amplitude: 5.875 mV
Lead Channel Setting Pacing Amplitude: 1.5 V
Lead Channel Setting Pacing Pulse Width: 0.4 ms
Lead Channel Setting Sensing Sensitivity: 0.3 mV
MDC IDC LEAD IMPLANT DT: 20150904
MDC IDC LEAD LOCATION: 753859
MDC IDC LEAD LOCATION: 753860
MDC IDC MSMT BATTERY VOLTAGE: 2.89 V
MDC IDC MSMT LEADCHNL LV IMPEDANCE VALUE: 494 Ohm
MDC IDC MSMT LEADCHNL LV IMPEDANCE VALUE: 551 Ohm
MDC IDC MSMT LEADCHNL LV IMPEDANCE VALUE: 627 Ohm
MDC IDC MSMT LEADCHNL LV IMPEDANCE VALUE: 703 Ohm
MDC IDC MSMT LEADCHNL LV IMPEDANCE VALUE: 798 Ohm
MDC IDC MSMT LEADCHNL LV IMPEDANCE VALUE: 836 Ohm
MDC IDC MSMT LEADCHNL LV IMPEDANCE VALUE: 931 Ohm
MDC IDC MSMT LEADCHNL RA PACING THRESHOLD AMPLITUDE: 0.625 V
MDC IDC MSMT LEADCHNL RA SENSING INTR AMPL: 4.5 mV
MDC IDC MSMT LEADCHNL RV IMPEDANCE VALUE: 551 Ohm
MDC IDC MSMT LEADCHNL RV IMPEDANCE VALUE: 684 Ohm
MDC IDC MSMT LEADCHNL RV PACING THRESHOLD AMPLITUDE: 0.5 V
MDC IDC MSMT LEADCHNL RV SENSING INTR AMPL: 3.875 mV
MDC IDC SESS DTM: 20191120114529
MDC IDC SET LEADCHNL LV PACING AMPLITUDE: 2.5 V
MDC IDC SET LEADCHNL RV PACING AMPLITUDE: 1.5 V
MDC IDC SET LEADCHNL RV PACING PULSEWIDTH: 0.4 ms
MDC IDC STAT BRADY AP VP PERCENT: 88.7 %
MDC IDC STAT BRADY AS VP PERCENT: 11.19 %
MDC IDC STAT BRADY RV PERCENT PACED: 99.78 %

## 2018-01-16 NOTE — Progress Notes (Signed)
HPI Mr. Bartosiewicz returns today for followup. He is a pleasant 52 yo man with chronic systolic heart failure, s/p ICD insertion. He admits to some dietary indiscretion. He has missed his meds occaisionally. No chest pain or sob. No edema. He admits to some dietary indiscretion Allergies  Allergen Reactions  . Aripiprazole Other (See Comments) and Anaphylaxis    Had negative side effects (on him) Had negative side effects (on him) Had negative side effects (on him)  . Tramadol Shortness Of Breath and Nausea Only    And, flu-like symptoms Pt states he has "flu type symptoms" after receiving tramadol And, flu-like symptoms And, flu-like symptoms Pt states he has "flu type symptoms" after receiving tramadol  . Ibuprofen Other (See Comments)    Spits up blood "Bleeding ulcers" per pt Spits up blood Spits up blood "Bleeding ulcers" per pt     Current Outpatient Medications  Medication Sig Dispense Refill  . apixaban (ELIQUIS) 5 MG TABS tablet Take 1 tablet (5 mg total) by mouth 2 (two) times daily. 180 tablet 3  . carvedilol (COREG) 12.5 MG tablet Take 1 tablet (12.5 mg total) by mouth 2 (two) times daily with a meal. 60 tablet 6  . diclofenac sodium (VOLTAREN) 1 % GEL Apply 4 g topically 4 (four) times daily. 100 g 0  . gabapentin (NEURONTIN) 300 MG capsule Take 300 mg by mouth 2 (two) times daily.    Marland Kitchen levothyroxine (SYNTHROID) 112 MCG tablet Take 1 tablet (112 mcg total) by mouth daily before breakfast. 30 tablet 1  . losartan (COZAAR) 25 MG tablet Take 1 tablet (25 mg total) by mouth daily. 90 tablet 3  . PROAIR HFA 108 (90 Base) MCG/ACT inhaler Inhale 2 puffs into the lungs every 4 (four) hours as needed for wheezing or shortness of breath.   3  . spironolactone (ALDACTONE) 25 MG tablet Take 1 tablet (25 mg total) by mouth daily. 90 tablet 3   No current facility-administered medications for this visit.      Past Medical History:  Diagnosis Date  . AICD (automatic  cardioverter/defibrillator) present    a. 10/2013 s/p MDT [id number] BiV ICD (ser# [id number]).  . COPD (chronic obstructive pulmonary disease) ([place])    a. Quit smoking in 2015.  Marland Kitchen HFrEF (heart failure with reduced ejection fraction) ([place])    a. 02/2012 Echo: EF "depressed";  b. 03/2013 Echo: Ef 15%, mod to sev LV dil w/ anteroseptal AK and sev glob HK. Dilated RV w/ reduced fxn. Mild to mod RAE, Sev LAE. Mild TR; c. 11/2013 Echo: EF 10-15%, redcued RV fxn, mild BAE, mild TR.  Marland Kitchen History of ileus   . Hypertension   . Hypokalemia   . Hypothyroidism   . Major depression   . NICM (nonischemic cardiomyopathy) ([place])    a. 02/2012 Echo: depressed EF; b. 02/2012 MV: EF 38%, small fixed apical defect, no ischemia; c. 03/2013 Echo: EF 15%; d. 08/2013 MV: apical ant, septal inf fixed defects, no ischemia; e. 10/2013 s/p MDT [id number] BiV ICD; d. 11/2013 Cath: results not avail, reportedly nl cors; e. 09/2014 MV: Fixed inf defect consistent w/ diaphragmatic atten. No ischemia. EF 28%.  . Permanent atrial fibrillation    a. CHA2DS2VASc = 2-->Eliquis.  . Sleep apnea    a. Doesn't tolerate CPAP.    ROS:   All systems reviewed and negative except as noted in the HPI.   Past Surgical History:  Procedure Laterality Date  . CARDIAC DEFIBRILLATOR  PLACEMENT    . PACEMAKER IMPLANT       Family History  Problem Relation Age of Onset  . COPD Mother        decsd 2008  . Hypertension Mother   . Atrial fibrillation Mother   . Heart failure Mother   . Cancer Father        unknown cancer, died when patient was 43  . Lung cancer Brother   . Atrial fibrillation Brother      Social History   Socioeconomic History  . Marital status: Legally Separated    Spouse name: Not on file  . Number of children: Not on file  . Years of education: Not on file  . Highest education level: Not on file  Occupational History  . Not on file  Social Needs  . Financial resource strain: Not on file  . Food insecurity:     Worry: Not on file    Inability: Not on file  . Transportation needs:    Medical: Not on file    Non-medical: Not on file  Tobacco Use  . Smoking status: Former Smoker    Types: Cigarettes    Last attempt to quit: 04/29/2014    Years since quitting: 3.7  . Smokeless tobacco: Never Used  Substance and Sexual Activity  . Alcohol use: No  . Drug use: Yes    Types: Marijuana    Comment: used today 05/23/2017 at 6AM  . Sexual activity: Not on file  Lifestyle  . Physical activity:    Days per week: Not on file    Minutes per session: Not on file  . Stress: Not on file  Relationships  . Social connections:    Talks on phone: Not on file    Gets together: Not on file    Attends religious service: Not on file    Active member of club or organization: Not on file    Attends meetings of clubs or organizations: Not on file    Relationship status: Not on file  . Intimate partner violence:    Fear of current or ex partner: Not on file    Emotionally abused: Not on file    Physically abused: Not on file    Forced sexual activity: Not on file  Other Topics Concern  . Not on file  Social History Narrative  . Not on file     BP 132/76 (BP Location: Left Arm)   Pulse 77   Ht 6' (1.829 m)   Wt (!) 317 lb (143.8 kg)   SpO2 97%   BMI 42.99 kg/m   Physical Exam:  obese appearing middle aged man, NAD HEENT: Unremarkable Neck:  No JVD, no thyromegally Lymphatics:  No adenopathy Back:  No CVA tenderness Lungs:  Clear with no wheezes HEART:  Regular rate rhythm, no murmurs, no rubs, no clicks Abd:  soft, positive bowel sounds, no organomegally, no rebound, no guarding Ext:  2 plus pulses, no edema, no cyanosis, no clubbing Skin:  No rashes no nodules Neuro:  CN II through XII intact, motor grossly intact   DEVICE  Normal device function.  See PaceArt for details.   Assess/Plan: 1. Chronic systolic heart failure - his symptoms are class 2. I have asked that he reduce his salt  intake and to not miss any of his meds. 2. Atrial fib with a RVR - he remains in NSR after receiving ICD shocks for rapid afib that fell in the VF zone. He  will continue his blood thinner. He does not feel any different. 3. Obesity - I encouraged the patient to lose weight. 4. ICD - his medtronic DDD ICD is working normally. He has less than a year of battery longevity.   Mikle Bosworth.D.

## 2018-01-16 NOTE — Patient Instructions (Signed)
Medication Instructions:  Your physician recommends that you continue on your current medications as directed. Please refer to the Current Medication list given to you today.  If you need a refill on your cardiac medications before your next appointment, please call your pharmacy.   Lab work: NONE  If you have labs (blood work) drawn today and your tests are completely normal, you will receive your results only by: Marland Kitchen MyChart Message (if you have MyChart) OR . A paper copy in the mail If you have any lab test that is abnormal or we need to change your treatment, we will call you to review the results.  Testing/Procedures: NONE   Follow-Up: At [facility], you and your health needs are our priority.  As part of our continuing mission to provide you with exceptional heart care, we have created designated Provider Care Teams.  These Care Teams include your primary Cardiologist (physician) and Advanced Practice Providers (APPs -  Physician Assistants and Nurse Practitioners) who all work together to provide you with the care you need, when you need it. You will need a follow up appointment in 9 months.  Please call our office 2 months in advance to schedule this appointment.  You may see Cristopher Peru, MD or one of the following Advanced Practice Providers on your designated Care Team:   Chanetta Marshall, NP . Tommye Standard, PA-C  Any Other Special Instructions Will Be Listed Below (If Applicable). Thank you for choosing [facility]!

## 2018-02-09 ENCOUNTER — Other Ambulatory Visit: Payer: Self-pay | Admitting: Emergency Medicine

## 2018-02-17 LAB — CUP PACEART REMOTE DEVICE CHECK
Battery Remaining Longevity: 13 mo
Battery Voltage: 2.88 V
Brady Statistic AP VP Percent: 0 %
Brady Statistic AP VS Percent: 0 %
Brady Statistic AS VP Percent: 91.67 %
Brady Statistic AS VS Percent: 8.33 %
Brady Statistic RV Percent Paced: 92.88 %
HighPow Impedance: 95 Ohm
Implantable Lead Implant Date: 20150904
Implantable Lead Location: 753860
Implantable Lead Model: 5076
Implantable Lead Model: 6935
Lead Channel Impedance Value: 1007 Ohm
Lead Channel Impedance Value: 418 Ohm
Lead Channel Impedance Value: 551 Ohm
Lead Channel Impedance Value: 570 Ohm
Lead Channel Impedance Value: 589 Ohm
Lead Channel Impedance Value: 646 Ohm
Lead Channel Impedance Value: 703 Ohm
Lead Channel Impedance Value: 798 Ohm
Lead Channel Impedance Value: 855 Ohm
Lead Channel Impedance Value: 855 Ohm
Lead Channel Pacing Threshold Amplitude: 0.5 V
Lead Channel Pacing Threshold Amplitude: 1.75 V
Lead Channel Pacing Threshold Pulse Width: 0.4 ms
Lead Channel Setting Pacing Amplitude: 2.5 V
Lead Channel Setting Pacing Pulse Width: 0.4 ms
MDC IDC LEAD IMPLANT DT: 20150904
MDC IDC LEAD IMPLANT DT: 20150904
MDC IDC LEAD LOCATION: 753858
MDC IDC LEAD LOCATION: 753859
MDC IDC MSMT LEADCHNL LV IMPEDANCE VALUE: 760 Ohm
MDC IDC MSMT LEADCHNL LV IMPEDANCE VALUE: 836 Ohm
MDC IDC MSMT LEADCHNL LV IMPEDANCE VALUE: 988 Ohm
MDC IDC MSMT LEADCHNL LV PACING THRESHOLD PULSEWIDTH: 0.4 ms
MDC IDC MSMT LEADCHNL RA SENSING INTR AMPL: 1.5 mV
MDC IDC MSMT LEADCHNL RA SENSING INTR AMPL: 1.5 mV
MDC IDC MSMT LEADCHNL RV SENSING INTR AMPL: 6.25 mV
MDC IDC MSMT LEADCHNL RV SENSING INTR AMPL: 6.25 mV
MDC IDC PG IMPLANT DT: 20150904
MDC IDC SESS DTM: 20191022183049
MDC IDC SET LEADCHNL RV PACING AMPLITUDE: 1.5 V
MDC IDC SET LEADCHNL RV PACING PULSEWIDTH: 0.4 ms
MDC IDC SET LEADCHNL RV SENSING SENSITIVITY: 0.3 mV
MDC IDC STAT BRADY RA PERCENT PACED: 0 %

## 2018-03-19 ENCOUNTER — Ambulatory Visit (INDEPENDENT_AMBULATORY_CARE_PROVIDER_SITE_OTHER): Payer: Medicaid Other

## 2018-03-19 DIAGNOSIS — I428 Other cardiomyopathies: Secondary | ICD-10-CM

## 2018-03-21 ENCOUNTER — Emergency Department (HOSPITAL_COMMUNITY): Payer: Medicaid Other

## 2018-03-21 ENCOUNTER — Other Ambulatory Visit: Payer: Self-pay

## 2018-03-21 ENCOUNTER — Encounter (HOSPITAL_COMMUNITY): Payer: Self-pay | Admitting: Emergency Medicine

## 2018-03-21 ENCOUNTER — Emergency Department (HOSPITAL_COMMUNITY)
Admission: EM | Admit: 2018-03-21 | Discharge: 2018-03-21 | Disposition: A | Discharge status: Home / Self Care | Payer: Medicaid Other | Attending: Emergency Medicine | Admitting: Emergency Medicine

## 2018-03-21 DIAGNOSIS — Z7901 Long term (current) use of anticoagulants: Secondary | ICD-10-CM | POA: Diagnosis not present

## 2018-03-21 DIAGNOSIS — I5022 Chronic systolic (congestive) heart failure: Secondary | ICD-10-CM | POA: Diagnosis not present

## 2018-03-21 DIAGNOSIS — I5043 Acute on chronic combined systolic (congestive) and diastolic (congestive) heart failure: Secondary | ICD-10-CM | POA: Diagnosis not present

## 2018-03-21 DIAGNOSIS — I2510 Atherosclerotic heart disease of native coronary artery without angina pectoris: Secondary | ICD-10-CM | POA: Diagnosis not present

## 2018-03-21 DIAGNOSIS — R112 Nausea with vomiting, unspecified: Secondary | ICD-10-CM | POA: Insufficient documentation

## 2018-03-21 DIAGNOSIS — R1084 Generalized abdominal pain: Secondary | ICD-10-CM | POA: Insufficient documentation

## 2018-03-21 DIAGNOSIS — Z87891 Personal history of nicotine dependence: Secondary | ICD-10-CM | POA: Insufficient documentation

## 2018-03-21 DIAGNOSIS — J449 Chronic obstructive pulmonary disease, unspecified: Secondary | ICD-10-CM | POA: Diagnosis not present

## 2018-03-21 DIAGNOSIS — I110 Hypertensive heart disease with heart failure: Secondary | ICD-10-CM | POA: Diagnosis not present

## 2018-03-21 DIAGNOSIS — I130 Hypertensive heart and chronic kidney disease with heart failure and stage 1 through stage 4 chronic kidney disease, or unspecified chronic kidney disease: Secondary | ICD-10-CM | POA: Diagnosis not present

## 2018-03-21 DIAGNOSIS — N182 Chronic kidney disease, stage 2 (mild): Secondary | ICD-10-CM | POA: Insufficient documentation

## 2018-03-21 DIAGNOSIS — E039 Hypothyroidism, unspecified: Secondary | ICD-10-CM | POA: Insufficient documentation

## 2018-03-21 DIAGNOSIS — Z79899 Other long term (current) drug therapy: Secondary | ICD-10-CM | POA: Diagnosis not present

## 2018-03-21 DIAGNOSIS — R109 Unspecified abdominal pain: Secondary | ICD-10-CM | POA: Diagnosis present

## 2018-03-21 DIAGNOSIS — Z950 Presence of cardiac pacemaker: Secondary | ICD-10-CM | POA: Diagnosis not present

## 2018-03-21 LAB — URINALYSIS, ROUTINE W REFLEX MICROSCOPIC
BILIRUBIN URINE: NEGATIVE
Bacteria, UA: NONE SEEN
GLUCOSE, UA: NEGATIVE mg/dL
Ketones, ur: NEGATIVE mg/dL
Leukocytes, UA: NEGATIVE
Nitrite: NEGATIVE
PH: 5 (ref 5.0–8.0)
Protein, ur: NEGATIVE mg/dL
Specific Gravity, Urine: 1.021 (ref 1.005–1.030)

## 2018-03-21 LAB — LIPASE, BLOOD: Lipase: 30 U/L (ref 11–51)

## 2018-03-21 LAB — COMPREHENSIVE METABOLIC PANEL
ALBUMIN: 4 g/dL (ref 3.5–5.0)
ALK PHOS: 56 U/L (ref 38–126)
ALT: 17 U/L (ref 0–44)
AST: 19 U/L (ref 15–41)
Anion gap: 9 (ref 5–15)
BILIRUBIN TOTAL: 1.3 mg/dL — AB (ref 0.3–1.2)
BUN: 11 mg/dL (ref 6–20)
CALCIUM: 9 mg/dL (ref 8.9–10.3)
CO2: 24 mmol/L (ref 22–32)
Chloride: 104 mmol/L (ref 98–111)
Creatinine, Ser: 0.97 mg/dL (ref 0.61–1.24)
GFR calc Af Amer: >60 mL/min (ref >60)
GLUCOSE: 96 mg/dL (ref 70–99)
Potassium: 3.3 mmol/L — ABNORMAL LOW (ref 3.5–5.1)
Sodium: 137 mmol/L (ref 135–145)
TOTAL PROTEIN: 7.3 g/dL (ref 6.5–8.1)

## 2018-03-21 LAB — CBC
HCT: 41 % (ref 39.0–52.0)
Hemoglobin: 13.5 g/dL (ref 13.0–17.0)
MCH: 31.7 pg (ref 26.0–34.0)
MCHC: 32.9 g/dL (ref 30.0–36.0)
MCV: 96.2 fL (ref 80.0–100.0)
PLATELETS: 156 10*3/uL (ref 150–400)
RBC: 4.26 MIL/uL (ref 4.22–5.81)
RDW: 13 % (ref 11.5–15.5)
WBC: 9.1 10*3/uL (ref 4.0–10.5)
nRBC: 0 % (ref 0.0–0.2)

## 2018-03-21 LAB — CUP PACEART REMOTE DEVICE CHECK
Date Time Interrogation Session: 20200123125900
Implantable Lead Implant Date: 20150904
Implantable Lead Implant Date: 20150904
Implantable Lead Location: 753858
Implantable Lead Model: 5076
Implantable Pulse Generator Implant Date: 20150904
MDC IDC LEAD IMPLANT DT: 20150904
MDC IDC LEAD LOCATION: 753859
MDC IDC LEAD LOCATION: 753860

## 2018-03-21 MED ORDER — DICYCLOMINE HCL 20 MG PO TABS: 20.0000 mg | ORAL_TABLET | Freq: Two times a day (BID) | ORAL | 0 refills | Status: DC | PRN

## 2018-03-21 MED ORDER — SODIUM CHLORIDE 0.9 % IV BOLUS
500.0000 mL | Freq: Once | INTRAVENOUS | Status: AC
  Administered 2018-03-21: 500 mL via INTRAVENOUS

## 2018-03-21 MED ORDER — IOPAMIDOL (ISOVUE-300) INJECTION 61%
100.0000 mL | Freq: Once | INTRAVENOUS | Status: AC | PRN
  Administered 2018-03-21: 100 mL via INTRAVENOUS

## 2018-03-21 MED ORDER — ONDANSETRON HCL 4 MG PO TABS: 4.0000 mg | ORAL_TABLET | Freq: Four times a day (QID) | ORAL | 0 refills | Status: DC | PRN

## 2018-03-21 MED ORDER — MORPHINE SULFATE (PF) 4 MG/ML IV SOLN
4.0000 mg | Freq: Once | INTRAVENOUS | Status: AC
  Administered 2018-03-21: 4 mg via INTRAVENOUS
  Filled 2018-03-21: qty 1

## 2018-03-21 MED ORDER — ONDANSETRON HCL 4 MG/2ML IJ SOLN
4.0000 mg | Freq: Once | INTRAMUSCULAR | Status: AC
  Administered 2018-03-21: 4 mg via INTRAVENOUS
  Filled 2018-03-21: qty 2

## 2018-03-21 NOTE — ED Triage Notes (Signed)
Pt c/o RT sided abdominal pain that radiates into groin and lower back with n/v/d "for a few weeks." States he saw PCP a week ago and she recommended for him to come to ED, but pt thought pain would go away so he waited.

## 2018-03-21 NOTE — ED Provider Notes (Signed)
[facility] EMERGENCY DEPARTMENT Provider Note   CSN: [id number] Arrival date & time: 03/21/18  1624     History   Chief Complaint Chief Complaint  Patient presents with  . Abdominal Pain    HPI Gary Kirk is a 53 y.o. male.  HPI Patient presents with several weeks of right-sided abdominal pain which radiates to the lower abdomen into the right flank.  He states is associated with nausea and multiple episodes of vomiting.  States he passed a small amount of stool yesterday.  No blood in the stool.  No fever or chills.  No dysuria, hematuria, frequency or urgency. Past Medical History:  Diagnosis Date  . AICD (automatic cardioverter/defibrillator) present    a. 10/2013 s/p MDT [id number] BiV ICD (ser# [id number]).  . COPD (chronic obstructive pulmonary disease) ([place])    a. Quit smoking in 2015.  Marland Kitchen HFrEF (heart failure with reduced ejection fraction) ([place])    a. 02/2012 Echo: EF "depressed";  b. 03/2013 Echo: Ef 15%, mod to sev LV dil w/ anteroseptal AK and sev glob HK. Dilated RV w/ reduced fxn. Mild to mod RAE, Sev LAE. Mild TR; c. 11/2013 Echo: EF 10-15%, redcued RV fxn, mild BAE, mild TR.  Marland Kitchen History of ileus   . Hypertension   . Hypokalemia   . Hypothyroidism   . Major depression   . NICM (nonischemic cardiomyopathy) ([place])    a. 02/2012 Echo: depressed EF; b. 02/2012 MV: EF 38%, small fixed apical defect, no ischemia; c. 03/2013 Echo: EF 15%; d. 08/2013 MV: apical ant, septal inf fixed defects, no ischemia; e. 10/2013 s/p MDT [id number] BiV ICD; d. 11/2013 Cath: results not avail, reportedly nl cors; e. 09/2014 MV: Fixed inf defect consistent w/ diaphragmatic atten. No ischemia. EF 28%.  . Permanent atrial fibrillation    a. CHA2DS2VASc = 2-->Eliquis.  . Sleep apnea    a. Doesn't tolerate CPAP.    Patient Active Problem List   Diagnosis Date Noted  . Chronic systolic dysfunction of left ventricle   . Morbid obesity ([place]) 05/23/2017  . ICD (implantable  cardioverter-defibrillator) in place 05/23/2017  . Acute on chronic combined systolic and diastolic CHF (congestive heart failure) ([place])   . Atrial fibrillation with RVR ([place])   . Low TSH level   . Chest pain 05/09/2016  . Pneumonia 05/09/2016  . Hypothyroidism 05/09/2016  . Nausea 05/09/2016  . Hypokalemia 05/09/2016  . Ileus ([place]) 05/09/2016  . Non-ischemic cardiomyopathy ([place])   . Prolonged Q-T interval on ECG   . Creatinine elevation   . Severe episode of recurrent major depressive disorder, without psychotic features ([place])   . Severe recurrent major depression without psychotic features ([place]) 05/24/2015  . MDD (major depressive disorder) 05/24/2015  . MDD (major depressive disorder), recurrent episode, severe ([place]) 05/24/2015  . Epigastric pain 03/19/2014  . Right upper quadrant abdominal pain 03/19/2014  . AICD discharge 12/01/2013  . CKD (chronic kidney disease), stage II 09/26/2013  . LBBB (left bundle branch block) 09/26/2013  . Medical non-compliance 09/26/2013  . 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 Polysubstance abuse ([place]) 09/26/2013  . Respiratory failure with hypoxia ([place]) 04/04/2013  . Fatigue 04/03/2013  . Palliative care encounter 04/03/2013  . Shock liver 04/03/2013  . Abdominal pain 04/01/2013  . Chronic obstructive pulmonary disease ([place]) 04/01/2013  . Gout 04/01/2013  . Hypertension 04/01/2013  . Chronic systolic congestive heart failure ([place]) 04/01/2013  . Marijuana use 04/01/2013  . Obstructive sleep apnea on CPAP 04/01/2013  .  Persistent atrial fibrillation 04/01/2013  . Tobacco abuse 04/01/2013    Past Surgical History:  Procedure Laterality Date  . CARDIAC DEFIBRILLATOR PLACEMENT    . PACEMAKER IMPLANT          Home Medications    Prior to Admission medications   Medication Sig Start Date End Date Taking? Authorizing Provider  apixaban (ELIQUIS) 5 MG TABS tablet Take 1 tablet (5 mg total) by mouth 2 (two) times daily.  06/12/17   Deboraha Sprang, MD  carvedilol (COREG) 12.5 MG tablet Take 1 tablet (12.5 mg total) by mouth 2 (two) times daily with a meal. 12/26/17   Baldwin Jamaica, PA-C  diclofenac sodium (VOLTAREN) 1 % GEL Apply 4 g topically 4 (four) times daily. 11/13/17   Noemi Chapel, MD  dicyclomine (BENTYL) 20 MG tablet Take 1 tablet (20 mg total) by mouth 2 (two) times daily as needed for spasms. 03/21/18   Julianne Rice, MD  gabapentin (NEURONTIN) 300 MG capsule Take 300 mg by mouth 2 (two) times daily.    [provider]  levothyroxine (SYNTHROID) 112 MCG tablet Take 1 tablet (112 mcg total) by mouth daily before breakfast. 11/13/17   Noemi Chapel, MD  losartan (COZAAR) 25 MG tablet Take 1 tablet (25 mg total) by mouth daily. 06/12/17 01/16/18  Deboraha Sprang, MD  ondansetron (ZOFRAN) 4 MG tablet Take 1 tablet (4 mg total) by mouth every 6 (six) hours as needed for nausea or vomiting. 03/21/18   Julianne Rice, MD  PROAIR HFA 108 [phone] Base) MCG/ACT inhaler Inhale 2 puffs into the lungs every 4 (four) hours as needed for wheezing or shortness of breath.  10/19/17   [provider]  spironolactone (ALDACTONE) 25 MG tablet Take 1 tablet (25 mg total) by mouth daily. 07/10/17 01/16/18  Theora Gianotti, NP    Family History Family History  Problem Relation Age of Onset  . COPD Mother        decsd 2008  . Hypertension Mother   . Atrial fibrillation Mother   . Heart failure Mother   . Cancer Father        unknown cancer, died when patient was 21  . Lung cancer Brother   . Atrial fibrillation Brother     Social History Social History   Tobacco Use  . Smoking status: Former Smoker    Types: Cigarettes    Last attempt to quit: 04/29/2014    Years since quitting: 3.8  . Smokeless tobacco: Never Used  Substance Use Topics  . Alcohol use: No  . Drug use: Yes    Types: Marijuana    Comment: last use this am      Allergies   Aripiprazole; Tramadol; and  Ibuprofen   Review of Systems Review of Systems  Constitutional: Negative for chills and fever.  Respiratory: Negative for cough and shortness of breath.   Cardiovascular: Negative for chest pain.  Gastrointestinal: Positive for abdominal pain, constipation, nausea and vomiting. Negative for blood in stool.  Genitourinary: Positive for flank pain. Negative for dysuria, frequency, hematuria, penile swelling and testicular pain.  Musculoskeletal: Positive for back pain and myalgias. Negative for neck pain and neck stiffness.  Skin: Negative for rash and wound.  Neurological: Negative for dizziness, weakness, light-headedness, numbness and headaches.  All other systems reviewed and are negative.    Physical Exam Updated Vital Signs BP (!) 145/73   Pulse 70   Temp 98.1 F (36.7 C) (Oral)   Resp 16  Ht 6' (1.829 m)   Wt (!) 140.6 kg   SpO2 100%   BMI 42.04 kg/m   Physical Exam Vitals signs and nursing note reviewed.  Constitutional:      Appearance: Normal appearance. He is well-developed.  HENT:     Head: Normocephalic and atraumatic.     Mouth/Throat:     Mouth: Mucous membranes are moist.  Eyes:     Pupils: Pupils are equal, round, and reactive to light.  Neck:     Musculoskeletal: Normal range of motion and neck supple. No neck rigidity or muscular tenderness.  Cardiovascular:     Rate and Rhythm: Normal rate and regular rhythm.  Pulmonary:     Effort: Pulmonary effort is normal. No respiratory distress.     Breath sounds: Normal breath sounds. No stridor. No wheezing, rhonchi or rales.  Abdominal:     General: Bowel sounds are normal. There is distension.     Palpations: Abdomen is soft.     Tenderness: There is abdominal tenderness. There is no guarding or rebound.     Comments: Diffuse abdominal tenderness appears to be most pronounced in the right upper and right lower quadrants.  Genitourinary:    Penis: Normal.      Scrotum/Testes: Normal.     Comments: No  inguinal hernias. Musculoskeletal: Normal range of motion.        General: Tenderness present.     Comments: Mild right CVA tenderness to percussion.  No lower extremity swelling or asymmetry.  Distal pulses intact.  Lymphadenopathy:     Cervical: No cervical adenopathy.  Skin:    General: Skin is warm and dry.     Capillary Refill: Capillary refill takes less than 2 seconds.     Findings: No erythema or rash.  Neurological:     General: No focal deficit present.     Mental Status: He is alert and oriented to person, place, and time.  Psychiatric:        Mood and Affect: Mood normal.        Behavior: Behavior normal.      ED Treatments / Results  Labs (all labs ordered are listed, but only abnormal results are displayed) Labs Reviewed  COMPREHENSIVE METABOLIC PANEL - Abnormal; Notable for the following components:      Result Value   Potassium 3.3 (*)    Total Bilirubin 1.3 (*)    All other components within normal limits  URINALYSIS, ROUTINE W REFLEX MICROSCOPIC - Abnormal; Notable for the following components:   Hgb urine dipstick MODERATE (*)    All other components within normal limits  LIPASE, BLOOD  CBC    EKG None  Radiology Ct Abdomen Pelvis W Contrast  Result Date: 03/21/2018 CLINICAL DATA:  RIGHT-sided abdominal pain pain for 1 week. EXAM: CT ABDOMEN AND PELVIS WITH CONTRAST TECHNIQUE: Multidetector CT imaging of the abdomen and pelvis was performed using the standard protocol following bolus administration of intravenous contrast. CONTRAST:  142mL ISOVUE-300 IOPAMIDOL (ISOVUE-300) INJECTION 61% COMPARISON:  CT 05/30/2016 FINDINGS: Lower chest: Lung bases are clear. Hepatobiliary: No focal hepatic lesion. No biliary duct dilatation. Gallbladder is normal. Common bile duct is normal. Pancreas: Pancreas is normal. No ductal dilatation. No pancreatic inflammation. Spleen: Normal spleen Adrenals/urinary tract: Adrenal glands and kidneys are normal. The ureters and  bladder normal. Stomach/Bowel: Stomach, small bowel, appendix, and cecum are normal. The colon and rectosigmoid colon are normal. Vascular/Lymphatic: Abdominal aorta is normal caliber. No periportal or retroperitoneal adenopathy. No pelvic  adenopathy. Reproductive: Prostate normal Other: No inguinal hernia or ventral hernia. Musculoskeletal: No aggressive osseous lesion. IMPRESSION: 1. No acute abdominal or pelvic findings. 2. Normal appendix. 3. No obstructive uropathy. 4.  Aortic Atherosclerosis (ICD10-I70.0). Electronically Signed   By: Suzy Bouchard M.D.   On: 03/21/2018 20:19    Procedures Procedures (including critical care time)  Medications Ordered in ED Medications  morphine 4 MG/ML injection 4 mg (4 mg Intravenous Given 03/21/18 1901)  ondansetron (ZOFRAN) injection 4 mg (4 mg Intravenous Given 03/21/18 1901)  sodium chloride 0.9 % bolus 500 mL (0 mLs Intravenous Stopped 03/21/18 2143)  iopamidol (ISOVUE-300) 61 % injection 100 mL (100 mLs Intravenous Contrast Given 03/21/18 1956)     Initial Impression / Assessment and Plan / ED Course  I have reviewed the triage vital signs and the nursing notes.  Pertinent labs & imaging results that were available during my care of the patient were reviewed by me and considered in my medical decision making (see chart for details).     Abdominal exam is benign.  CT without any acute findings.  Advised to follow-up with gastroneurology.  Strict return precautions given.  Final Clinical Impressions(s) / ED Diagnoses   Final diagnoses:  Generalized abdominal pain  Non-intractable vomiting with nausea, unspecified vomiting type    ED Discharge Orders         Ordered    dicyclomine (BENTYL) 20 MG tablet  2 times daily PRN     03/21/18 2141    ondansetron (ZOFRAN) 4 MG tablet  Every 6 hours PRN     03/21/18 2141           Julianne Rice, MD 03/22/18 1623

## 2018-03-22 ENCOUNTER — Encounter: Payer: Self-pay | Admitting: Cardiology

## 2018-03-22 NOTE — Progress Notes (Signed)
Remote ICD transmission.   

## 2018-06-08 IMAGING — CT CT ABD-PELV W/ CM
2 of 5 series · 16 of 46 positions shown, 18 images · IV contrast (iopamidol)
Comparison: None.

CLINICAL DATA: Nausea, vomiting, diarrhea for 4 days, constipation
for 2 weeks, right lower quadrant pain

EXAM:
CT ABDOMEN AND PELVIS WITH CONTRAST
TECHNIQUE: Multidetector CT imaging of the abdomen and pelvis was performed
using the standard protocol following bolus administration of
intravenous contrast.
CONTRAST:  125mL NM76F0-0TT IOPAMIDOL (NM76F0-0TT) INJECTION 61%

[Series 2: axial st · axial · 0.98mm/px · z∈[-543,-118]mm · 13 of 95 slices shown, 15 images]
[im 5/95  soft-tissue]
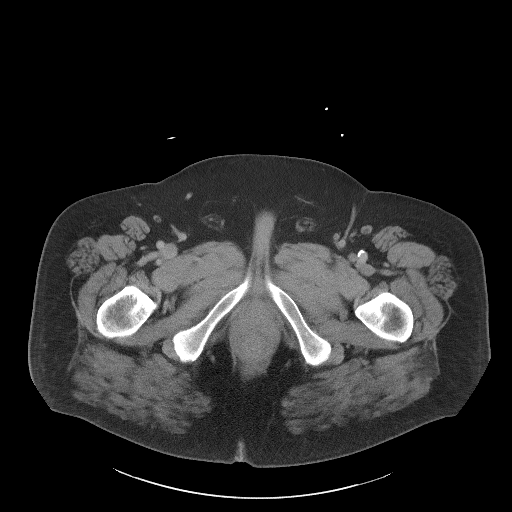
[im 5/95  bone]
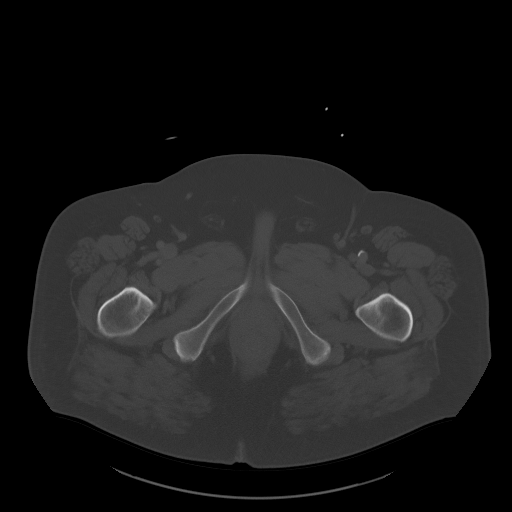
[im 15/95  soft-tissue]
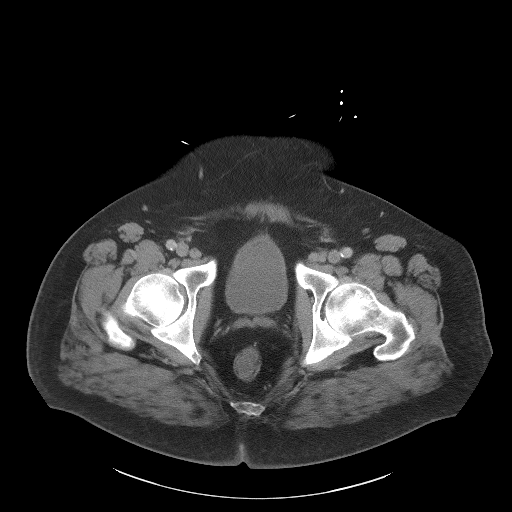
[im 20/95  soft-tissue]
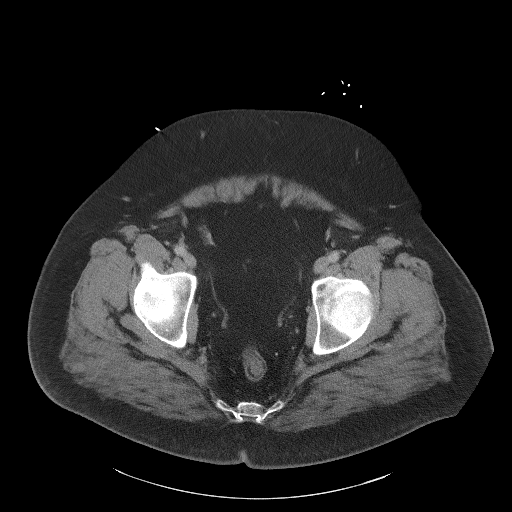
[im 25/95  soft-tissue]
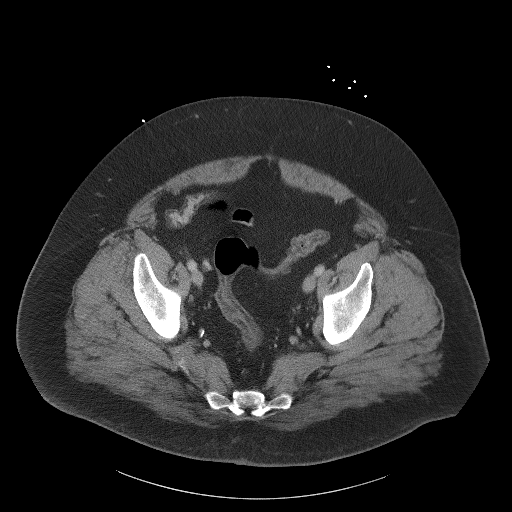
[im 35/95  soft-tissue]
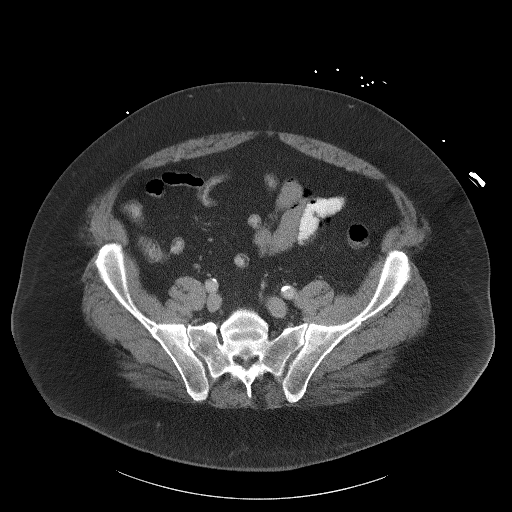
[im 40/95  soft-tissue]
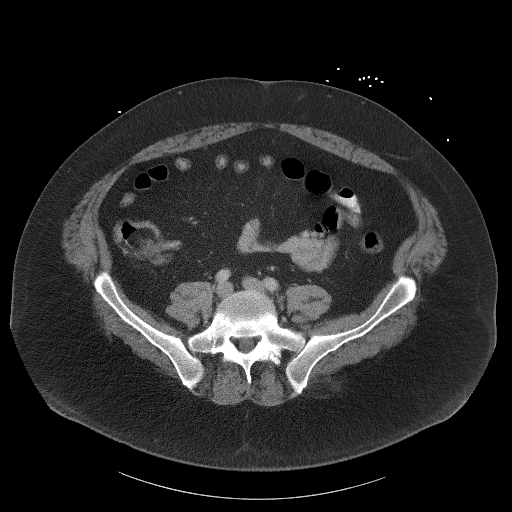
[im 50/95  soft-tissue]
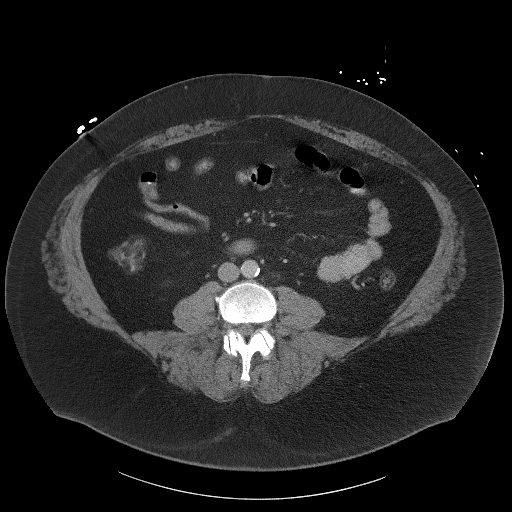
[im 55/95  soft-tissue]
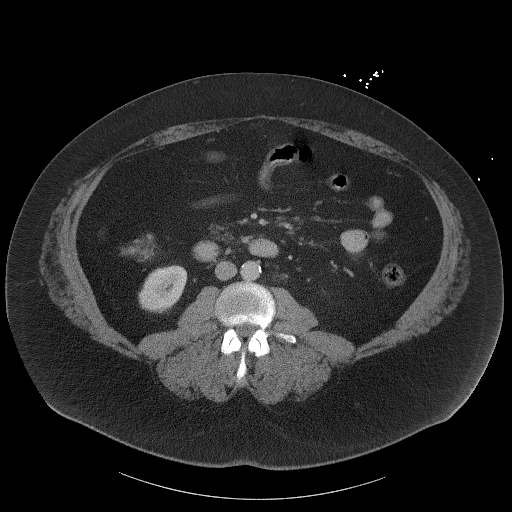
[im 60/95  soft-tissue]
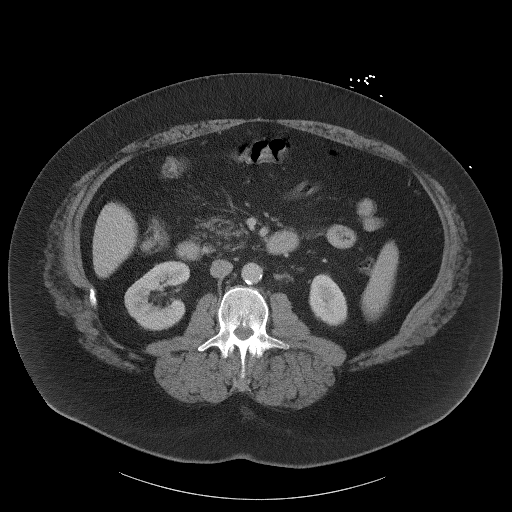
[im 60/95  bone]
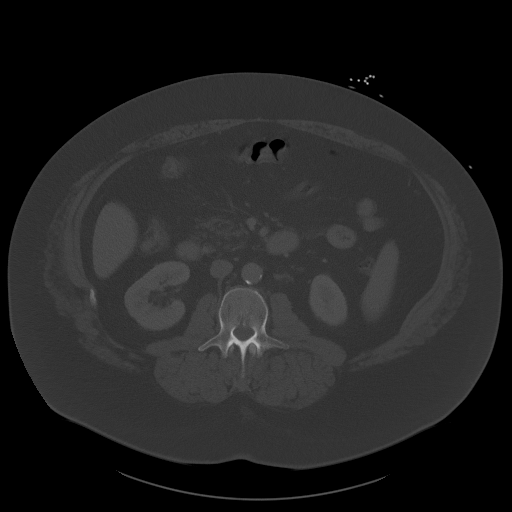
[im 70/95  soft-tissue]
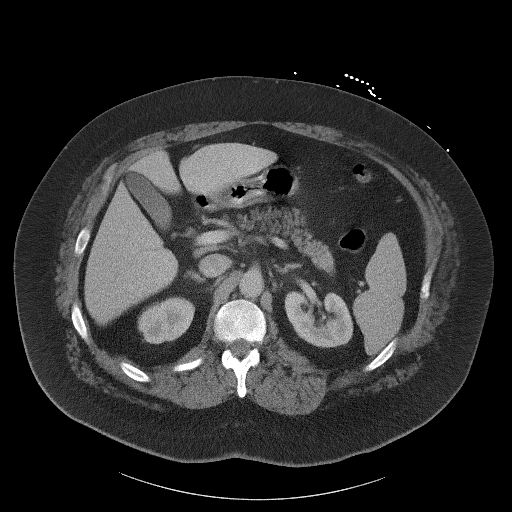
[im 75/95  soft-tissue]
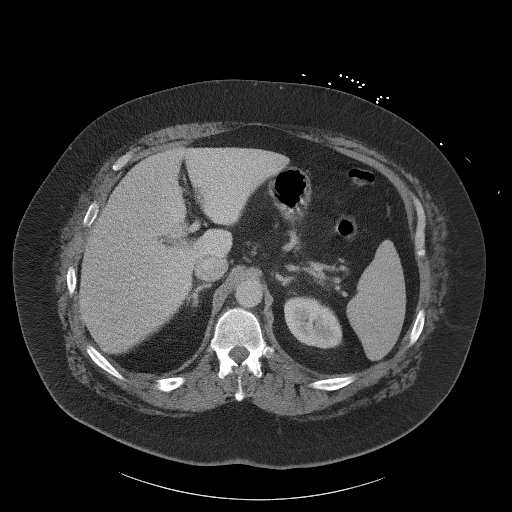
[im 80/95  soft-tissue]
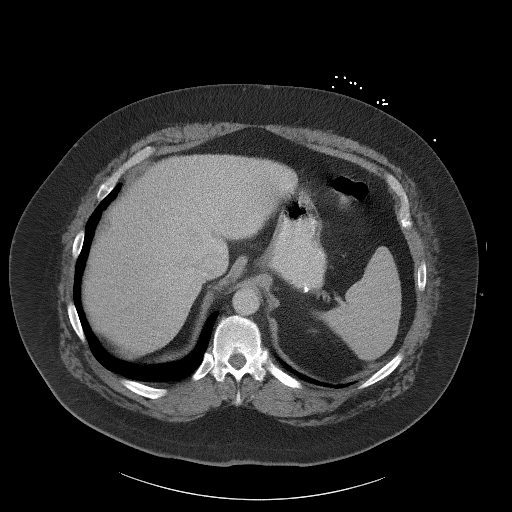
[im 90/95  soft-tissue]
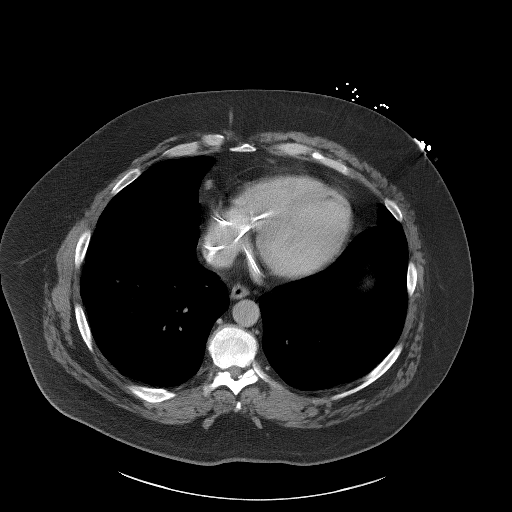

[Series 5: coronal st · coronal · 0.90mm/px · 3 of 122 slices shown]
[im 41/122  soft-tissue]
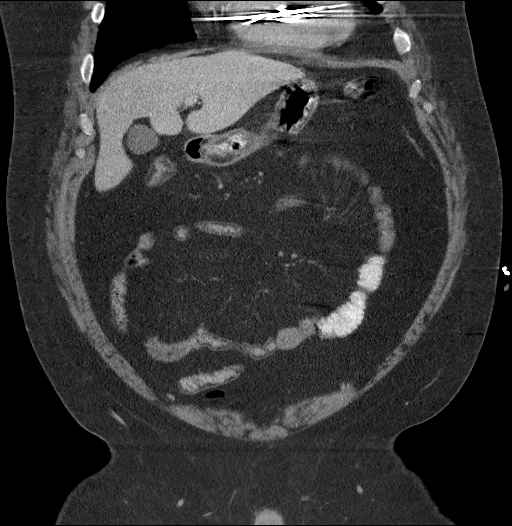
[im 54/122  soft-tissue]
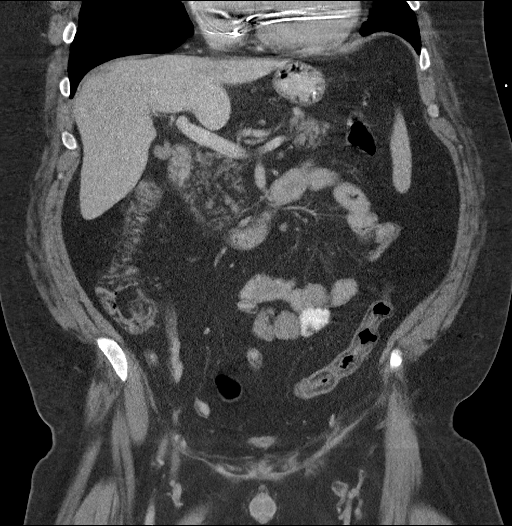
[im 68/122  soft-tissue]
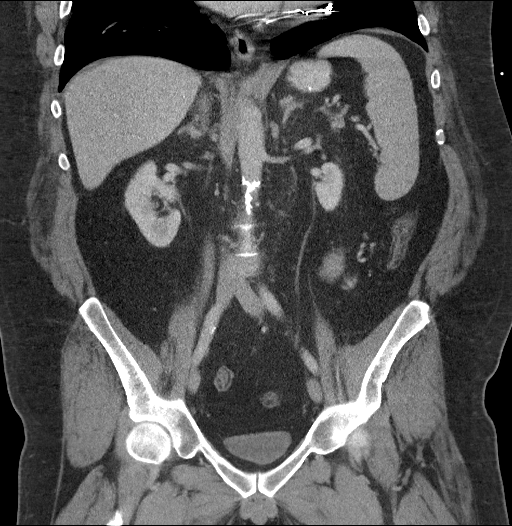

[16 of 46 positions shown; findings below may reference images not displayed]

FINDINGS: Lower chest: Lung bases shows small patchy infiltrate in right
middle lobe suspicious for infection/pneumonitis. Partially
visualized cardiac pacemaker leads.

Hepatobiliary: Enhanced liver shows no focal abnormality. No
calcified gallstones are noted within gallbladder.

Pancreas: The enhanced pancreas shows moderate fatty replacement
especially in the in the head and body of the pancreas. No focal
pancreatic mass. No pancreatic ductal dilatation. No definite
evidence of acute pancreatitis or peripancreatic fluid.

Spleen: Enhanced spleen with normal appearance.

Adrenals/Urinary Tract: No adrenal gland mass. Enhanced kidneys are
symmetrical in size. No hydronephrosis or hydroureter. No
nephrolithiasis. Delayed renal images shows bilateral renal
symmetrical excretion. Bilateral visualized proximal ureter is
unremarkable.

Stomach/Bowel: No small bowel obstruction. No thickened or dilated
small bowel loops. The terminal ileum is unremarkable. No pericecal
inflammation. Normal appendix is clearly identified retrocecal
position axial image 64.

Limited assessment of the colon which is empty collapsed. Only small
amount of gas noted in mid sigmoid colon. No evidence of colitis or
diverticulitis.

Vascular/Lymphatic: Atherosclerotic calcifications of abdominal
aorta and iliac arteries. No aortic aneurysm. No adenopathy.

Reproductive: Prostate gland and seminal vesicles are unremarkable.
The urinary bladder is unremarkable.

Other: No inguinal adenopathy. Small nonspecific bilateral inguinal
lymph nodes are noted not pathologic by size criteria. There is no
ascites or free abdominal air.

Musculoskeletal: No destructive bony lesions are noted. Mild
degenerative changes lower thoracic spine. Mild degenerative changes
bilateral SI joints.
IMPRESSION: 1. There is partially visualized patchy infiltrate in right middle
lobe anterior and laterally. This is highly suspicious for atypical
infection/ pneumonitis. Clinical correlation is necessary. Follow-up
to resolution is recommended.
2. There is moderate fatty replacement of the pancreas especially in
the region of body and head of the pancreas. No definite focal
pancreatic mass. No pancreatic ductal dilatation. No definite
evidence of acute pancreatitis.
3. No calcified gallstones are noted within gallbladder. No
nephrolithiasis. No hydronephrosis or hydroureter.
4. Normal appendix. No pericecal inflammation. No small bowel
obstruction. No evidence of colitis or diverticulitis. Limited
assessment of the colon which is empty partially collapsed.
5. No ascites or free abdominal air.

## 2018-06-08 IMAGING — DX DG CHEST 2V
2 series · 2 of 2 positions shown · non-contrast
Comparison: 04/29/2015

CLINICAL DATA: Increasing shortness of breath, vomiting for 4 days

EXAM:
CHEST  2 VIEW

[chest pa]
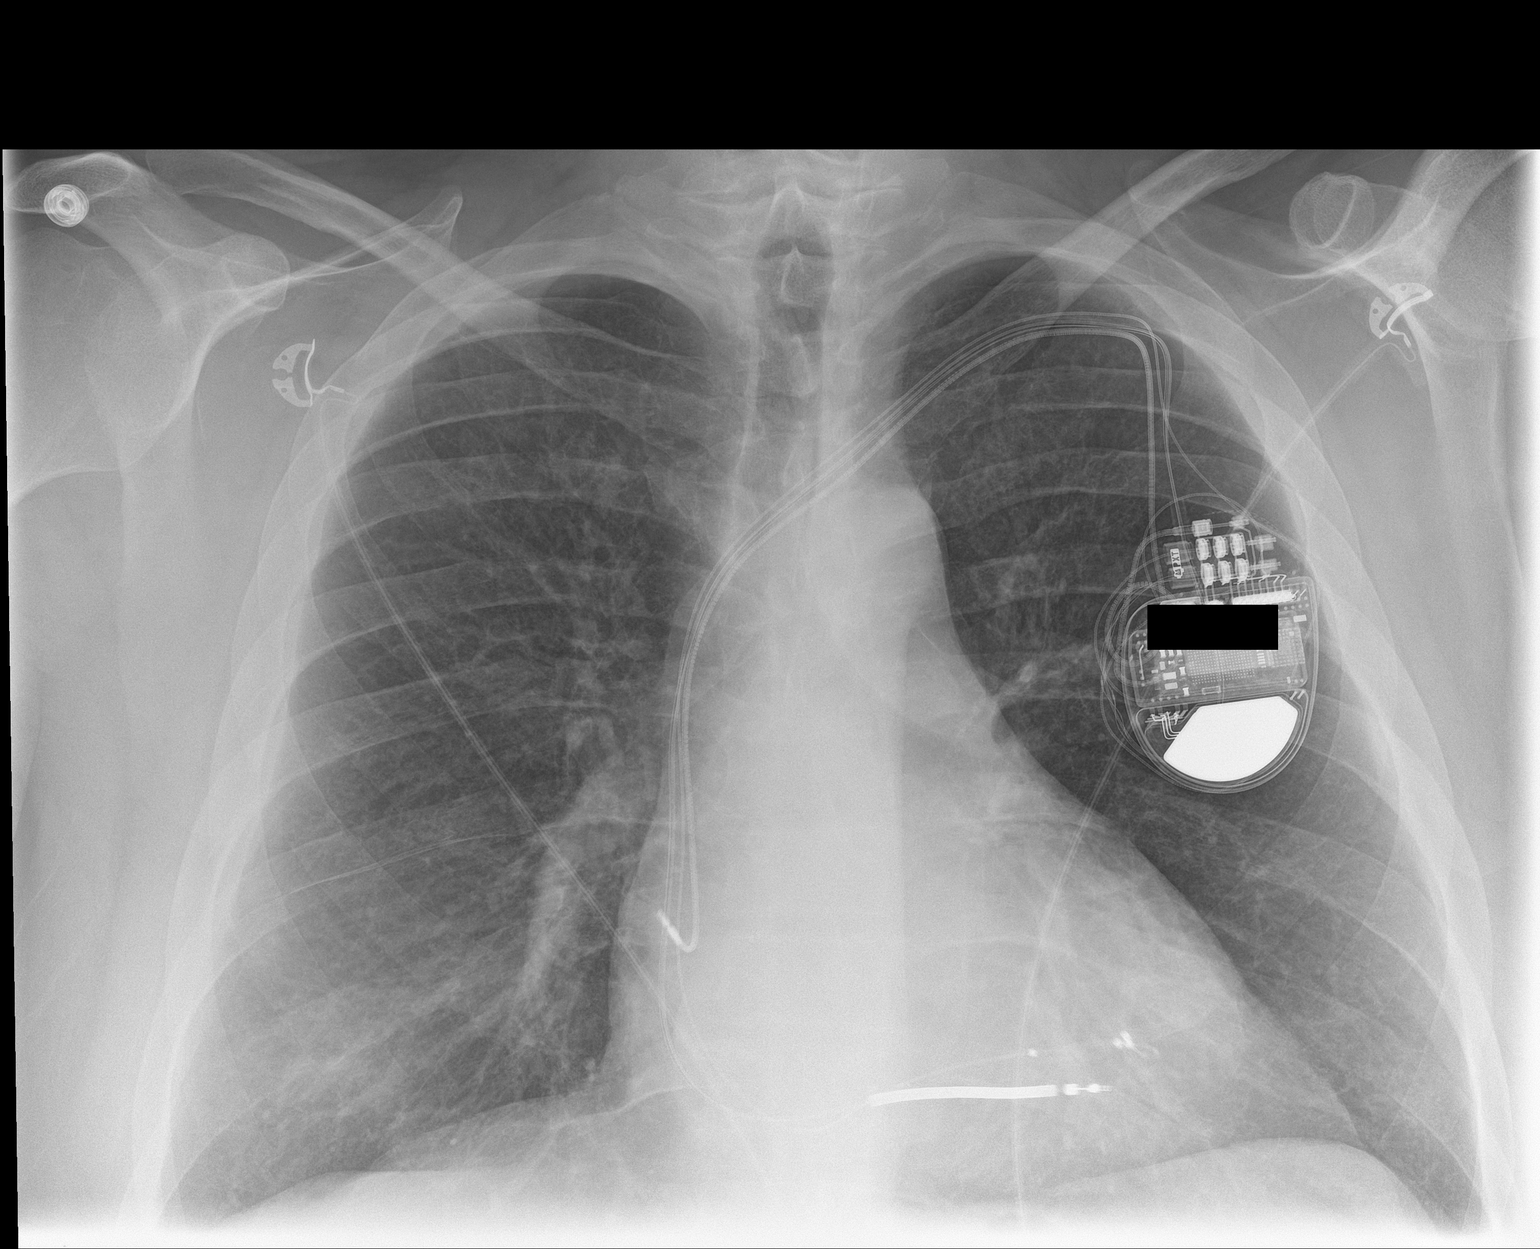

[chest lat]
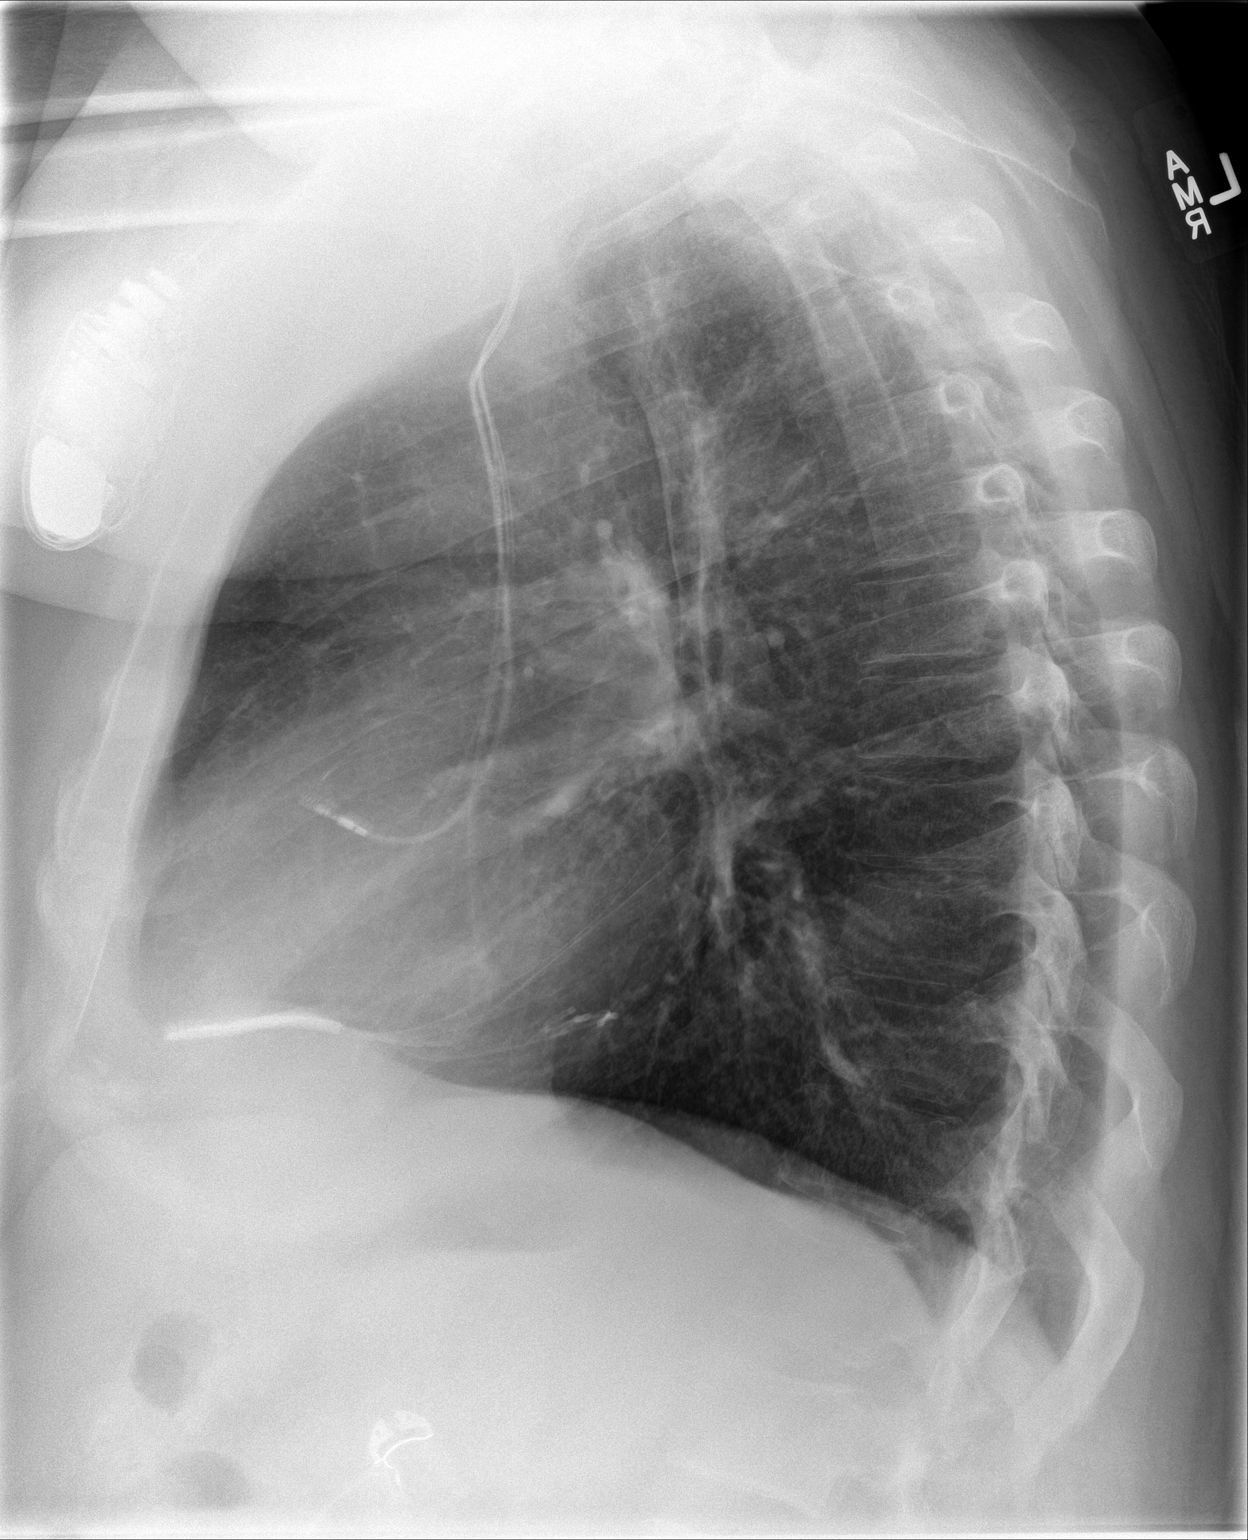

[2 of 2 positions shown; findings below may reference images not displayed]

FINDINGS: Left AICD remains in place, unchanged. Heart is borderline in size.
Lungs are clear. No effusions or acute bony abnormality.
IMPRESSION: Borderline heart size.  No active disease.

## 2018-06-18 ENCOUNTER — Ambulatory Visit (INDEPENDENT_AMBULATORY_CARE_PROVIDER_SITE_OTHER): Payer: Medicaid Other | Admitting: *Deleted

## 2018-06-18 ENCOUNTER — Other Ambulatory Visit: Payer: Self-pay

## 2018-06-18 DIAGNOSIS — I5022 Chronic systolic (congestive) heart failure: Secondary | ICD-10-CM | POA: Diagnosis not present

## 2018-06-18 DIAGNOSIS — I428 Other cardiomyopathies: Secondary | ICD-10-CM

## 2018-06-19 ENCOUNTER — Telehealth: Payer: Self-pay

## 2018-06-19 LAB — CUP PACEART REMOTE DEVICE CHECK
Battery Remaining Longevity: 10 mo
Battery Voltage: 2.87 V
Brady Statistic AP VP Percent: 79.74 %
Brady Statistic AP VS Percent: 0.06 %
Brady Statistic AS VP Percent: 20.15 %
Brady Statistic AS VS Percent: 0.06 %
Brady Statistic RA Percent Paced: 79.63 %
Brady Statistic RV Percent Paced: 99.63 %
Date Time Interrogation Session: 20200422124437
HighPow Impedance: 99 Ohm
Implantable Lead Implant Date: 20150904
Implantable Lead Implant Date: 20150904
Implantable Lead Implant Date: 20150904
Implantable Lead Location: 753858
Implantable Lead Location: 753859
Implantable Lead Location: 753860
Implantable Lead Model: 4298
Implantable Lead Model: 5076
Implantable Lead Model: 6935
Implantable Pulse Generator Implant Date: 20150904
Lead Channel Impedance Value: 399 Ohm
Lead Channel Impedance Value: 475 Ohm
Lead Channel Impedance Value: 494 Ohm
Lead Channel Impedance Value: 570 Ohm
Lead Channel Impedance Value: 570 Ohm
Lead Channel Impedance Value: 627 Ohm
Lead Channel Impedance Value: 684 Ohm
Lead Channel Impedance Value: 722 Ohm
Lead Channel Impedance Value: 760 Ohm
Lead Channel Impedance Value: 779 Ohm
Lead Channel Impedance Value: 798 Ohm
Lead Channel Impedance Value: 855 Ohm
Lead Channel Impedance Value: 912 Ohm
Lead Channel Pacing Threshold Amplitude: 0.5 V
Lead Channel Pacing Threshold Amplitude: 0.75 V
Lead Channel Pacing Threshold Amplitude: 1.5 V
Lead Channel Pacing Threshold Pulse Width: 0.4 ms
Lead Channel Pacing Threshold Pulse Width: 0.4 ms
Lead Channel Pacing Threshold Pulse Width: 0.4 ms
Lead Channel Sensing Intrinsic Amplitude: 3.75 mV
Lead Channel Sensing Intrinsic Amplitude: 3.75 mV
Lead Channel Sensing Intrinsic Amplitude: 5.875 mV
Lead Channel Sensing Intrinsic Amplitude: 5.875 mV
Lead Channel Setting Pacing Amplitude: 1.5 V
Lead Channel Setting Pacing Amplitude: 1.5 V
Lead Channel Setting Pacing Amplitude: 2.5 V
Lead Channel Setting Pacing Pulse Width: 0.4 ms
Lead Channel Setting Pacing Pulse Width: 0.4 ms
Lead Channel Setting Sensing Sensitivity: 0.3 mV

## 2018-06-19 NOTE — Telephone Encounter (Signed)
Unable to speak  with patient to remind of missed remote transmission 

## 2018-06-25 NOTE — Progress Notes (Signed)
Remote ICD transmission.   

## 2018-08-08 ENCOUNTER — Telehealth: Payer: Self-pay

## 2018-08-08 NOTE — Telephone Encounter (Signed)
Virtual Visit Pre-Appointment Phone Call  "H[place]hel, I am calling you today to discuss your upcoming appointment. We are currently trying to limit exposure to the virus that causes COVID-19 by seeing patients at home rather than in the office."  1. "What is the BEST phone number to call the day of the visit?" - include this in appointment notes  2. "Do you have or have access to (through a family member/friend) a smartphone with video capability that we can use for your visit?" a. If yes - list this number in appt notes as "cell" (if different from BEST phone #) and list the appointment type as a VIDEO visit in appointment notes b. If no - list the appointment type as a PHONE visit in appointment notes  3. Confirm consent - "In the setting of the current Covid19 crisis, you are scheduled for a video visit with your provider on 08/27/2018 at 9:20AM.  Just as we do with many in-office visits, in order for you to participate in this visit, we must obtain consent.  If you'd like, I can send this to your mychart (if signed up) or email for you to review.  Otherwise, I can obtain your verbal consent now.  All virtual visits are billed to your insurance company just like a normal visit would be.  By agreeing to a virtual visit, we'd like you to understand that the technology does not allow for your provider to perform an examination, and thus may limit your provider's ability to fully assess your condition. If your provider identifies any concerns that need to be evaluated in person, we will make arrangements to do so.  Finally, though the technology is pretty good, we cannot assure that it will always work on either your or our end, and in the setting of a video visit, we may have to convert it to a phone-only visit.  In either situation, we cannot ensure that we have a secure connection.  Are you willing to proceed?" STAFF: Did the patient verbally acknowledge consent to telehealth visit? Document YES/NO  here: YES  4. Advise patient to be prepared - "Two hours prior to your appointment, go ahead and check your blood pressure, pulse, oxygen saturation, and your weight (if you have the equipment to check those) and write them all down. When your visit starts, your provider will ask you for this information. If you have an Apple Watch or Kardia device, please plan to have heart rate information ready on the day of your appointment. Please have a pen and paper handy nearby the day of the visit as well."  5. Give patient instructions for MyChart download to smartphone OR Doximity/Doxy.me as below if video visit (depending on what platform provider is using)  6. Inform patient they will receive a phone call 15 minutes prior to their appointment time (may be from unknown caller ID) so they should be prepared to answer    TELEPHONE CALL NOTE  Gary Kirk has been deemed a candidate for a follow-up tele-health visit to limit community exposure during the Covid-19 pandemic. I spoke with the patient via phone to ensure availability of phone/video source, confirm preferred email & phone number, and discuss instructions and expectations.  I reminded JAZZ ROGALA to be prepared with any vital sign and/or heart rhythm information that could potentially be obtained via home monitoring, at the time of his visit. I reminded MONTEE TALLMAN to expect a phone call prior to his visit.  Rene Paci McClain 08/08/2018 11:46 AM   INSTRUCTIONS FOR DOWNLOADING THE MYCHART APP TO SMARTPHONE  - The patient must first make sure to have activated MyChart and know their login information - If Apple, go to [employer] and type in MyChart in the search bar and download the app. If Android, ask patient to go to [employer] and type in [place] in the search bar and download the app. The app is free but as with any other app downloads, their phone may require them to verify saved payment information or  Apple/Android password.  - The patient will need to then log into the app with their MyChart username and password, and select [facility] as their healthcare provider to link the account. When it is time for your visit, go to the MyChart app, find appointments, and click Begin Video Visit. Be sure to Select Allow for your device to access the Microphone and Camera for your visit. You will then be connected, and your provider will be with you shortly.  **If they have any issues connecting, or need assistance please contact MyChart service desk [phone])**  **If using a computer, in order to ensure the best quality for their visit they will need to use either of the following Internet Browsers: [employer], or Google Chrome**  IF USING DOXIMITY or DOXY.ME - The patient will receive a link just prior to their visit by text.     FULL LENGTH CONSENT FOR TELE-HEALTH VISIT   I hereby voluntarily request, consent and authorize [facility] and its employed or contracted physicians, physician assistants, nurse practitioners or other licensed health care professionals (the Practitioner), to provide me with telemedicine health care services (the "Services") as deemed necessary by the treating Practitioner. I acknowledge and consent to receive the Services by the Practitioner via telemedicine. I understand that the telemedicine visit will involve communicating with the Practitioner through live audiovisual communication technology and the disclosure of certain medical information by electronic transmission. I acknowledge that I have been given the opportunity to request an in-person assessment or other available alternative prior to the telemedicine visit and am voluntarily participating in the telemedicine visit.  I understand that I have the right to withhold or withdraw my consent to the use of telemedicine in the course of my care at any time, without affecting my right to future care  or treatment, and that the Practitioner or I may terminate the telemedicine visit at any time. I understand that I have the right to inspect all information obtained and/or recorded in the course of the telemedicine visit and may receive copies of available information for a reasonable fee.  I understand that some of the potential risks of receiving the Services via telemedicine include:  Marland Kitchen Delay or interruption in medical evaluation due to technological equipment failure or disruption; . Information transmitted may not be sufficient (e.g. poor resolution of images) to allow for appropriate medical decision making by the Practitioner; and/or  . In rare instances, security protocols could fail, causing a breach of personal health information.  Furthermore, I acknowledge that it is my responsibility to provide information about my medical history, conditions and care that is complete and accurate to the best of my ability. I acknowledge that Practitioner's advice, recommendations, and/or decision may be based on factors not within their control, such as incomplete or inaccurate data provided by me or distortions of diagnostic images or specimens that may result from electronic transmissions. I understand that  the practice of medicine is not an exact science and that Practitioner makes no warranties or guarantees regarding treatment outcomes. I acknowledge that I will receive a copy of this consent concurrently upon execution via email to the email address I last provided but may also request a printed copy by calling the office of [facility].    I understand that my insurance will be billed for this visit.   I have read or had this consent read to me. . I understand the contents of this consent, which adequately explains the benefits and risks of the Services being provided via telemedicine.  . I have been provided ample opportunity to ask questions regarding this consent and the Services and have had  my questions answered to my satisfaction. . I give my informed consent for the services to be provided through the use of telemedicine in my medical care  By participating in this telemedicine visit I agree to the above.

## 2018-08-27 ENCOUNTER — Telehealth: Payer: Medicaid Other | Admitting: Cardiovascular Disease

## 2018-09-01 NOTE — Progress Notes (Signed)
Virtual Visit via Telephone Note   This visit type was conducted due to national recommendations for restrictions regarding the COVID-19 Pandemic (e.g. social distancing) in an effort to limit this patient's exposure and mitigate transmission in our community.  Due to his co-morbid illnesses, this patient is at least at moderate risk for complications without adequate follow up.  This format is felt to be most appropriate for this patient at this time.  The patient did not have access to video technology/had technical difficulties with video requiring transitioning to audio format only (telephone).  All issues noted in this document were discussed and addressed.  No physical exam could be performed with this format.  Please refer to the patient's chart for his  consent to telehealth for [facility].   I connected with  Gary Kirk on 09/02/18 by a video enabled telemedicine application and verified that I am speaking with the correct person using two identifiers. I discussed the limitations of evaluation and management by telemedicine. The patient expressed understanding and agreed to proceed.   Evaluation Performed:  Follow-up visit  Date:  09/02/2018   ID:  Gary Kirk, DOB 1965-10-17, MRN [id number]  Patient Location:  [place] [address]   Provider location:   [facility], [place] office  PCP:  [name], FNP  Cardiologist:  Patsy Baltimore   Chief Complaint: Stopped 2 of his medications    History of Present Illness:    Gary Kirk is a 53 y.o. male who presents via audio/video conferencing for a telehealth visit today.   The patient does not symptoms concerning for COVID-19 infection (fever, chills, cough, or new SHORTNESS OF BREATH).   Patient has a past medical history of Nonischemic cardiomyopathy Cardiogenic shock 03/2013, shock liver, CR >3, pressors, PNA, hyponatremia, EF 15% Persistent atrial  fibrillation Smoker, COPD,quit 2015 Sleep apnea, No CPAP Chronic systolic and diastolic CHF ICD, put in sept 4th 2015 ICD shock  11/2013 Major depression Hypothyroidism Nausea and vomiting summer 2018 Hypokalemia Ileus (HCC) PermanentAtrial fibrillation with rapid ventricular response (HCC) Low TSH level Acute on chronic combined systolic and diastolic CHF (congestive heart failure) ([place]) He presents for f/u of hisnonischemic cardiomyopathy and ICD  In follow-up today reports he is not taking spironolactone or losartan Has not filled these in quite some time Was told that he needed to have lab monitoring on these medications so [place] stopped them[place] lab  Denies any leg swelling, weight gain, shortness of breath  Recent notes from EP reviewed ICD shocks for rapid afib that fell in the VF zone. He denies any tachycardia concerning for atrial fibrillation  Continues to smoke marijuana, usually 3 bowls a day.   He says this helps him with his anxiety.  Trying to deal with the hot weather bad  Trip to the emergency room earlier this year for abdominal pain, unclear etiology Records reviewed CT scan abdomen showing aortic atherosclerosis  Other past medical history reviewed Previously managed by [facility] last seen 2017 by cardiology Some social issues, unable to do ICD download or go to appointments,   previous echocardiogram with [name], results not available but reports having ejection fraction 51%  By his account , defibrillator placed 2015 in the setting of ejection fraction 15%, pneumonia, cardiogenic shock, requiring pressors, shock liver, hyponatremia, renal failure  Reports being very ill summer 2018, nausea and  anorexia lost 40 pounds  Possibly secondary to thyroid abnormality  Previous stress test September 2011   Prior CV studies:   The following studies were reviewed today:   Past  Medical History:  Diagnosis Date  . AICD (automatic cardioverter/defibrillator) present    a. 10/2013 s/p MDT [id number] BiV ICD (ser# [id number]).  . COPD (chronic obstructive pulmonary disease) ([place])    a. Quit smoking in 2015.  Marland Kitchen HFrEF (heart failure with reduced ejection fraction) ([place])    a. 02/2012 Echo: EF "depressed";  b. 03/2013 Echo: Ef 15%, mod to sev LV dil w/ anteroseptal AK and sev glob HK. Dilated RV w/ reduced fxn. Mild to mod RAE, Sev LAE. Mild TR; c. 11/2013 Echo: EF 10-15%, redcued RV fxn, mild BAE, mild TR.  Marland Kitchen History of ileus   . Hypertension   . Hypokalemia   . Hypothyroidism   . Major depression   . NICM (nonischemic cardiomyopathy) ([place])    a. 02/2012 Echo: depressed EF; b. 02/2012 MV: EF 38%, small fixed apical defect, no ischemia; c. 03/2013 Echo: EF 15%; d. 08/2013 MV: apical ant, septal inf fixed defects, no ischemia; e. 10/2013 s/p MDT [id number] BiV ICD; d. 11/2013 Cath: results not avail, reportedly nl cors; e. 09/2014 MV: Fixed inf defect consistent w/ diaphragmatic atten. No ischemia. EF 28%.  . Permanent atrial fibrillation    a. CHA2DS2VASc = 2-->Eliquis.  . Sleep apnea    a. Doesn't tolerate CPAP.   Past Surgical History:  Procedure Laterality Date  . CARDIAC DEFIBRILLATOR PLACEMENT    . PACEMAKER IMPLANT       Current Meds  Medication Sig  . apixaban (ELIQUIS) 5 MG TABS tablet Take 1 tablet (5 mg total) by mouth 2 (two) times daily.  . carvedilol (COREG) 12.5 MG tablet Take 1 tablet (12.5 mg total) by mouth 2 (two) times daily with a meal.  . gabapentin (NEURONTIN) 300 MG capsule Take 300 mg by mouth 2 (two) times daily.  Marland Kitchen levothyroxine (SYNTHROID) 112 MCG tablet Take 1 tablet (112 mcg total) by mouth daily before breakfast.  . PROAIR HFA 108 (90 Base) MCG/ACT inhaler Inhale 2 puffs into the lungs every 4 (four) hours as needed for wheezing or shortness of breath.   . [DISCONTINUED] spironolactone (ALDACTONE) 25 MG tablet Take 1 tablet (25 mg total) by mouth  daily.     Allergies:   Aripiprazole, Tramadol, and Ibuprofen   Social History   Tobacco Use  . Smoking status: Former Smoker    Types: Cigarettes    Quit date: 04/29/2014    Years since quitting: 4.3  . Smokeless tobacco: Never Used  Substance Use Topics  . Alcohol use: No  . Drug use: Yes    Types: Marijuana    Comment: last use this am      Current Outpatient Medications on File Prior to Visit  Medication Sig Dispense Refill  . apixaban (ELIQUIS) 5 MG TABS tablet Take 1 tablet (5 mg total) by mouth 2 (two) times daily. 180 tablet 3  . carvedilol (COREG) 12.5 MG tablet Take 1 tablet (12.5 mg total) by mouth 2 (two) times daily with a meal. 60 tablet 6  . gabapentin (NEURONTIN) 300 MG capsule Take 300 mg by mouth 2 (two) times daily.    Marland Kitchen levothyroxine (SYNTHROID) 112 MCG tablet Take 1 tablet (112 mcg total) by mouth daily before breakfast. 30 tablet 1  . PROAIR HFA 108 (90 Base) MCG/ACT inhaler Inhale 2 puffs into the lungs every  4 (four) hours as needed for wheezing or shortness of breath.   3   No current facility-administered medications on file prior to visit.      Family Hx: The patient's family history includes Atrial fibrillation in his brother and mother; COPD in his mother; Cancer in his father; Heart failure in his mother; Hypertension in his mother; Lung cancer in his brother.  ROS:   Please see the history of present illness.    Review of Systems  Constitutional: Negative.   HENT: Negative.   Respiratory: Negative.   Cardiovascular: Negative.   Gastrointestinal: Negative.   Musculoskeletal: Negative.   Neurological: Negative.   Psychiatric/Behavioral: Negative.   All other systems reviewed and are negative.    Labs/Other Tests and Data Reviewed:    Recent Labs: 12/26/2017: B Natriuretic Peptide 23.0 03/21/2018: ALT 17; BUN 11; Creatinine, Ser 0.97; Hemoglobin 13.5; Platelets 156; Potassium 3.3; Sodium 137   Recent Lipid Panel No results found for:  CHOL, TRIG, HDL, CHOLHDL, LDLCALC, LDLDIRECT  Wt Readings from Last 3 Encounters:  09/02/18 (!) 307 lb (139.3 kg)  03/21/18 (!) 310 lb (140.6 kg)  01/16/18 (!) 317 lb (143.8 kg)     Exam:    Vital Signs: Vital signs may also be detailed in the HPI Ht 6\' 1"  (1.854 m)   Wt (!) 307 lb (139.3 kg)   BMI 40.50 kg/m   Wt Readings from Last 3 Encounters:  09/02/18 (!) 307 lb (139.3 kg)  03/21/18 (!) 310 lb (140.6 kg)  01/16/18 (!) 317 lb (143.8 kg)   Temp Readings from Last 3 Encounters:  03/21/18 98.1 F (36.7 C) (Oral)  12/26/17 98.1 F (36.7 C) (Oral)  11/13/17 97.9 F (36.6 C) (Oral)   BP Readings from Last 3 Encounters:  03/21/18 (!) 145/73  01/16/18 132/76  12/26/17 (!) 162/84   Pulse Readings from Last 3 Encounters:  03/21/18 70  01/16/18 77  12/26/17 88    140/70, pulse 70 resp 16  Well nourished, well developed male in no acute distress. Constitutional:  oriented to person, place, and time. No distress.    ASSESSMENT & PLAN:    Chronic systolic congestive heart failure (HCC) -  Reports he is euvolemic, not on Lasix In fact he stopped his spironolactone and losartan  Non-ischemic cardiomyopathy ([place]) - Prior echocardiogram from primary care has been requested, performed several years ago We have recommended repeat echocardiogram through our system so we can see images Recommended he restart losartan 25 mg daily He does not want spironolactone given need to monitor lab work Also recommend we check thyroid on next lab work  Persistent atrial fibrillation -  On Eliquis, carvedilol for rate and rhythm control  Essential hypertension -  Blood pressure typically well controlled, he will monitor through pharmacy visits  Marijuana abuse - 3 times a day but does not smoke regular cigarettes  ICD (implantable cardioverter-defibrillator) in place -  Followed by EP   COVID-19 Education: The signs and symptoms of COVID-19 were discussed with the patient and  how to seek care for testing (follow up with PCP or arrange E-visit).  The importance of social distancing was discussed today.  Patient Risk:   After full review of this patients clinical status, I feel that they are at least moderate risk at this time.  Time:   Today, I have spent 25 minutes with the patient with telehealth technology discussing the cardiac and medical problems/diagnoses detailed above   10 min spent reviewing the chart  prior to patient visit today   Medication Adjustments/Labs and Tests Ordered: Current medicines are reviewed at length with the patient today.  Concerns regarding medicines are outlined above.   Tests Ordered: No tests ordered   Medication Changes: No changes made   Disposition: Follow-up in 6 months   Signed, Ida Rogue, MD  09/02/2018 8:10 AM    [facility] [address] [place] [address], [place], [place] [address]

## 2018-09-02 ENCOUNTER — Encounter: Payer: Self-pay | Admitting: Cardiovascular Disease

## 2018-09-02 ENCOUNTER — Telehealth: Payer: Self-pay | Admitting: *Deleted

## 2018-09-02 ENCOUNTER — Other Ambulatory Visit: Payer: Self-pay

## 2018-09-02 ENCOUNTER — Telehealth (INDEPENDENT_AMBULATORY_CARE_PROVIDER_SITE_OTHER): Payer: Medicaid Other | Admitting: Cardiovascular Disease

## 2018-09-02 VITALS — Ht 73.0 in | Wt 307.0 lb

## 2018-09-02 DIAGNOSIS — Z95810 Presence of automatic (implantable) cardiac defibrillator: Secondary | ICD-10-CM

## 2018-09-02 DIAGNOSIS — Z4502 Encounter for adjustment and management of automatic implantable cardiac defibrillator: Secondary | ICD-10-CM | POA: Diagnosis not present

## 2018-09-02 DIAGNOSIS — I10 Essential (primary) hypertension: Secondary | ICD-10-CM

## 2018-09-02 DIAGNOSIS — I5022 Chronic systolic (congestive) heart failure: Secondary | ICD-10-CM

## 2018-09-02 DIAGNOSIS — I428 Other cardiomyopathies: Secondary | ICD-10-CM

## 2018-09-02 DIAGNOSIS — I4891 Unspecified atrial fibrillation: Secondary | ICD-10-CM | POA: Diagnosis not present

## 2018-09-02 DIAGNOSIS — F1210 Cannabis abuse, uncomplicated: Secondary | ICD-10-CM

## 2018-09-02 DIAGNOSIS — I4819 Other persistent atrial fibrillation: Secondary | ICD-10-CM

## 2018-09-02 MED ORDER — LOSARTAN POTASSIUM 25 MG PO TABS: 25.0000 mg | ORAL_TABLET | Freq: Every day | ORAL | 3 refills | Status: DC

## 2018-09-02 NOTE — Telephone Encounter (Signed)
Called patient and went over AVS from virtual visit today. He verbalized understanding of the instructions as listed on the AVS. AVS mailed to patient.

## 2018-09-02 NOTE — Patient Instructions (Addendum)
Echo When he comes into the office in the fall prior to defibrillator change out Reason for study, nonischemic cardiomyopathy   Medication Instructions:  Please restart losartan 25 mg daily - sent to pharmacy.  If you need a refill on your cardiac medications before your next appointment, please call your pharmacy.    Lab work: TSH on next trip to the office - same day as echo   If you have labs (blood work) drawn today and your tests are completely normal, you will receive your results only by: Marland Kitchen MyChart Message (if you have MyChart) OR . A paper copy in the mail If you have any lab test that is abnormal or we need to change your treatment, we will call you to review the results.   Testing/Procedures: Echo as above - In the fall - around September or October.  Your physician has requested that you have an echocardiogram. Echocardiography is a painless test that uses sound waves to create images of your heart. It provides your doctor with information about the size and shape of your heart and how well your heart's chambers and valves are working. This procedure takes approximately one hour. There are no restrictions for this procedure.     Follow-Up: At [facility], you and your health needs are our priority.  As part of our continuing mission to provide you with exceptional heart care, we have created designated Provider Care Teams.  These Care Teams include your primary Cardiologist (physician) and Advanced Practice Providers (APPs -  Physician Assistants and Nurse Practitioners) who all work together to provide you with the care you need, when you need it.  . You will need a follow up appointment in 6 months .   Please call our office 2 months in advance to schedule this appointment.    . Providers on your designated Care Team:   . Murray Hodgkins, NP . Christell Faith, PA-C . Marrianne Mood, PA-C  Any Other Special Instructions Will Be Listed Below (If Applicable).  For  educational health videos Log in to : [email or web address] Or : [email or web address], password : [email or web address]

## 2018-09-17 ENCOUNTER — Ambulatory Visit (INDEPENDENT_AMBULATORY_CARE_PROVIDER_SITE_OTHER): Payer: Medicaid Other | Admitting: *Deleted

## 2018-09-17 DIAGNOSIS — I428 Other cardiomyopathies: Secondary | ICD-10-CM

## 2018-09-17 DIAGNOSIS — I5022 Chronic systolic (congestive) heart failure: Secondary | ICD-10-CM

## 2018-09-17 LAB — CUP PACEART REMOTE DEVICE CHECK
Battery Remaining Longevity: 8 mo
Battery Voltage: 2.86 V
Brady Statistic AP VP Percent: 73.7 %
Brady Statistic AP VS Percent: 0.05 %
Brady Statistic AS VP Percent: 26.15 %
Brady Statistic AS VS Percent: 0.1 %
Brady Statistic RA Percent Paced: 73.67 %
Brady Statistic RV Percent Paced: 99.72 %
Date Time Interrogation Session: 20200721083823
HighPow Impedance: 78 Ohm
Implantable Lead Implant Date: 20150904
Implantable Lead Implant Date: 20150904
Implantable Lead Implant Date: 20150904
Implantable Lead Location: 753858
Implantable Lead Location: 753859
Implantable Lead Location: 753860
Implantable Lead Model: 4298
Implantable Lead Model: 5076
Implantable Lead Model: 6935
Implantable Pulse Generator Implant Date: 20150904
Lead Channel Impedance Value: 361 Ohm
Lead Channel Impedance Value: 475 Ohm
Lead Channel Impedance Value: 475 Ohm
Lead Channel Impedance Value: 551 Ohm
Lead Channel Impedance Value: 551 Ohm
Lead Channel Impedance Value: 627 Ohm
Lead Channel Impedance Value: 627 Ohm
Lead Channel Impedance Value: 703 Ohm
Lead Channel Impedance Value: 703 Ohm
Lead Channel Impedance Value: 760 Ohm
Lead Channel Impedance Value: 760 Ohm
Lead Channel Impedance Value: 798 Ohm
Lead Channel Impedance Value: 855 Ohm
Lead Channel Pacing Threshold Amplitude: 0.5 V
Lead Channel Pacing Threshold Amplitude: 0.875 V
Lead Channel Pacing Threshold Amplitude: 1.375 V
Lead Channel Pacing Threshold Pulse Width: 0.4 ms
Lead Channel Pacing Threshold Pulse Width: 0.4 ms
Lead Channel Pacing Threshold Pulse Width: 0.4 ms
Lead Channel Sensing Intrinsic Amplitude: 4 mV
Lead Channel Sensing Intrinsic Amplitude: 4 mV
Lead Channel Sensing Intrinsic Amplitude: 4.5 mV
Lead Channel Sensing Intrinsic Amplitude: 4.5 mV
Lead Channel Setting Pacing Amplitude: 1.5 V
Lead Channel Setting Pacing Amplitude: 1.75 V
Lead Channel Setting Pacing Amplitude: 2.5 V
Lead Channel Setting Pacing Pulse Width: 0.4 ms
Lead Channel Setting Pacing Pulse Width: 0.4 ms
Lead Channel Setting Sensing Sensitivity: 0.3 mV

## 2018-10-01 ENCOUNTER — Telehealth: Payer: Self-pay

## 2018-10-01 NOTE — Telephone Encounter (Signed)
Alert received for 7 months to ERI. Pt aware. Will schedule monthly battery checks.

## 2018-10-02 ENCOUNTER — Encounter: Payer: Self-pay | Admitting: Cardiology

## 2018-10-02 NOTE — Progress Notes (Signed)
Remote ICD transmission.   

## 2018-11-01 ENCOUNTER — Emergency Department (HOSPITAL_COMMUNITY)
Admission: EM | Admit: 2018-11-01 | Discharge: 2018-11-01 | Disposition: A | Discharge status: Home / Self Care | Payer: Medicaid Other | Attending: Emergency Medicine | Admitting: Emergency Medicine

## 2018-11-01 ENCOUNTER — Encounter (HOSPITAL_COMMUNITY): Payer: Self-pay

## 2018-11-01 ENCOUNTER — Ambulatory Visit (INDEPENDENT_AMBULATORY_CARE_PROVIDER_SITE_OTHER): Payer: Medicaid Other | Admitting: *Deleted

## 2018-11-01 ENCOUNTER — Other Ambulatory Visit: Payer: Self-pay

## 2018-11-01 ENCOUNTER — Emergency Department (HOSPITAL_COMMUNITY): Payer: Medicaid Other

## 2018-11-01 DIAGNOSIS — I428 Other cardiomyopathies: Secondary | ICD-10-CM

## 2018-11-01 DIAGNOSIS — Z7901 Long term (current) use of anticoagulants: Secondary | ICD-10-CM | POA: Diagnosis not present

## 2018-11-01 DIAGNOSIS — Z23 Encounter for immunization: Secondary | ICD-10-CM | POA: Insufficient documentation

## 2018-11-01 DIAGNOSIS — I10 Essential (primary) hypertension: Secondary | ICD-10-CM | POA: Diagnosis not present

## 2018-11-01 DIAGNOSIS — Z79899 Other long term (current) drug therapy: Secondary | ICD-10-CM | POA: Insufficient documentation

## 2018-11-01 DIAGNOSIS — Y999 Unspecified external cause status: Secondary | ICD-10-CM | POA: Insufficient documentation

## 2018-11-01 DIAGNOSIS — E039 Hypothyroidism, unspecified: Secondary | ICD-10-CM | POA: Insufficient documentation

## 2018-11-01 DIAGNOSIS — Y939 Activity, unspecified: Secondary | ICD-10-CM | POA: Insufficient documentation

## 2018-11-01 DIAGNOSIS — S2241XA Multiple fractures of ribs, right side, initial encounter for closed fracture: Secondary | ICD-10-CM | POA: Diagnosis not present

## 2018-11-01 DIAGNOSIS — M545 Low back pain: Secondary | ICD-10-CM | POA: Diagnosis not present

## 2018-11-01 DIAGNOSIS — R0789 Other chest pain: Secondary | ICD-10-CM | POA: Insufficient documentation

## 2018-11-01 DIAGNOSIS — Y9289 Other specified places as the place of occurrence of the external cause: Secondary | ICD-10-CM | POA: Insufficient documentation

## 2018-11-01 DIAGNOSIS — Z87891 Personal history of nicotine dependence: Secondary | ICD-10-CM | POA: Diagnosis not present

## 2018-11-01 DIAGNOSIS — S299XXA Unspecified injury of thorax, initial encounter: Secondary | ICD-10-CM | POA: Diagnosis present

## 2018-11-01 LAB — CUP PACEART REMOTE DEVICE CHECK
Battery Remaining Longevity: 7 mo
Battery Voltage: 2.84 V
Brady Statistic AP VP Percent: 70.17 %
Brady Statistic AP VS Percent: 0.05 %
Brady Statistic AS VP Percent: 29.66 %
Brady Statistic AS VS Percent: 0.12 %
Brady Statistic RA Percent Paced: 70.18 %
Brady Statistic RV Percent Paced: 99.76 %
Date Time Interrogation Session: 20200904072221
HighPow Impedance: 82 Ohm
Implantable Lead Implant Date: 20150904
Implantable Lead Implant Date: 20150904
Implantable Lead Implant Date: 20150904
Implantable Lead Location: 753858
Implantable Lead Location: 753859
Implantable Lead Location: 753860
Implantable Lead Model: 4298
Implantable Lead Model: 5076
Implantable Lead Model: 6935
Implantable Pulse Generator Implant Date: 20150904
Lead Channel Impedance Value: 361 Ohm
Lead Channel Impedance Value: 399 Ohm
Lead Channel Impedance Value: 418 Ohm
Lead Channel Impedance Value: 570 Ohm
Lead Channel Impedance Value: 589 Ohm
Lead Channel Impedance Value: 646 Ohm
Lead Channel Impedance Value: 646 Ohm
Lead Channel Impedance Value: 646 Ohm
Lead Channel Impedance Value: 703 Ohm
Lead Channel Impedance Value: 722 Ohm
Lead Channel Impedance Value: 779 Ohm
Lead Channel Impedance Value: 798 Ohm
Lead Channel Impedance Value: 912 Ohm
Lead Channel Pacing Threshold Amplitude: 0.5 V
Lead Channel Pacing Threshold Amplitude: 1 V
Lead Channel Pacing Threshold Amplitude: 1.125 V
Lead Channel Pacing Threshold Pulse Width: 0.4 ms
Lead Channel Pacing Threshold Pulse Width: 0.4 ms
Lead Channel Pacing Threshold Pulse Width: 0.4 ms
Lead Channel Sensing Intrinsic Amplitude: 3.875 mV
Lead Channel Sensing Intrinsic Amplitude: 3.875 mV
Lead Channel Sensing Intrinsic Amplitude: 4.25 mV
Lead Channel Sensing Intrinsic Amplitude: 4.25 mV
Lead Channel Setting Pacing Amplitude: 1.5 V
Lead Channel Setting Pacing Amplitude: 2 V
Lead Channel Setting Pacing Amplitude: 2.25 V
Lead Channel Setting Pacing Pulse Width: 0.4 ms
Lead Channel Setting Pacing Pulse Width: 0.4 ms
Lead Channel Setting Sensing Sensitivity: 0.3 mV

## 2018-11-01 LAB — BASIC METABOLIC PANEL
Anion gap: 10 (ref 5–15)
BUN: 12 mg/dL (ref 6–20)
CO2: 25 mmol/L (ref 22–32)
Calcium: 9.2 mg/dL (ref 8.9–10.3)
Chloride: 100 mmol/L (ref 98–111)
Creatinine, Ser: 1.03 mg/dL (ref 0.61–1.24)
GFR calc Af Amer: >60 mL/min (ref >60)
GFR calc non Af Amer: >60 mL/min (ref >60)
Glucose, Bld: 121 mg/dL — ABNORMAL HIGH (ref 70–99)
Potassium: 3.1 mmol/L — ABNORMAL LOW (ref 3.5–5.1)
Sodium: 135 mmol/L (ref 135–145)

## 2018-11-01 LAB — CBC WITH DIFFERENTIAL/PLATELET
Abs Immature Granulocytes: 0.06 10*3/uL (ref 0.00–0.07)
Basophils Absolute: 0.1 10*3/uL (ref 0.0–0.1)
Basophils Relative: 0 %
Eosinophils Absolute: 0.1 10*3/uL (ref 0.0–0.5)
Eosinophils Relative: 1 %
HCT: 43.4 % (ref 39.0–52.0)
Hemoglobin: 14.3 g/dL (ref 13.0–17.0)
Immature Granulocytes: 1 %
Lymphocytes Relative: 9 %
Lymphs Abs: 1.2 10*3/uL (ref 0.7–4.0)
MCH: 31.6 pg (ref 26.0–34.0)
MCHC: 32.9 g/dL (ref 30.0–36.0)
MCV: 95.8 fL (ref 80.0–100.0)
Monocytes Absolute: 0.8 10*3/uL (ref 0.1–1.0)
Monocytes Relative: 6 %
Neutro Abs: 10.5 10*3/uL — ABNORMAL HIGH (ref 1.7–7.7)
Neutrophils Relative %: 83 %
Platelets: 177 10*3/uL (ref 150–400)
RBC: 4.53 MIL/uL (ref 4.22–5.81)
RDW: 13 % (ref 11.5–15.5)
WBC: 12.7 10*3/uL — ABNORMAL HIGH (ref 4.0–10.5)
nRBC: 0 % (ref 0.0–0.2)

## 2018-11-01 MED ORDER — ONDANSETRON 4 MG PO TBDP
4.0000 mg | ORAL_TABLET | Freq: Once | ORAL | Status: AC
  Administered 2018-11-01: 4 mg via ORAL
  Filled 2018-11-01: qty 1

## 2018-11-01 MED ORDER — IOHEXOL 300 MG/ML  SOLN
100.0000 mL | Freq: Once | INTRAMUSCULAR | Status: AC | PRN
  Administered 2018-11-01: 100 mL via INTRAVENOUS

## 2018-11-01 MED ORDER — HYDROCODONE-ACETAMINOPHEN 5-325 MG PO TABS: 1.0000 | ORAL_TABLET | Freq: Three times a day (TID) | ORAL | 0 refills | Status: DC | PRN

## 2018-11-01 MED ORDER — FENTANYL CITRATE (PF) 100 MCG/2ML IJ SOLN
100.0000 ug | Freq: Once | INTRAMUSCULAR | Status: AC
  Administered 2018-11-01: 100 ug via INTRAVENOUS
  Filled 2018-11-01: qty 2

## 2018-11-01 MED ORDER — POTASSIUM CHLORIDE CRYS ER 20 MEQ PO TBCR
20.0000 meq | EXTENDED_RELEASE_TABLET | Freq: Once | ORAL | Status: AC
  Administered 2018-11-01: 20 meq via ORAL
  Filled 2018-11-01: qty 1

## 2018-11-01 MED ORDER — TETANUS-DIPHTH-ACELL PERTUSSIS 5-2.5-18.5 LF-MCG/0.5 IM SUSP
0.5000 mL | Freq: Once | INTRAMUSCULAR | Status: AC
  Administered 2018-11-01: 0.5 mL via INTRAMUSCULAR
  Filled 2018-11-01: qty 0.5

## 2018-11-01 MED ORDER — HYDROCODONE-ACETAMINOPHEN 5-325 MG PO TABS
2.0000 | ORAL_TABLET | Freq: Once | ORAL | Status: AC
  Administered 2018-11-01: 2 via ORAL
  Filled 2018-11-01: qty 2

## 2018-11-01 MED ORDER — HYDROCODONE-ACETAMINOPHEN 5-325 MG PO TABS
1.0000 | ORAL_TABLET | Freq: Once | ORAL | Status: AC
  Administered 2018-11-01: 1 via ORAL
  Filled 2018-11-01: qty 1

## 2018-11-01 NOTE — Discharge Instructions (Addendum)
Your imaging today showed fractures of the fourth through ninth ribs.  Otherwise no other injury.  Your CT scan did show some abnormal liver surface that could indicate cirrhosis versus fatty liver disease.  Please follow-up with your primary care doctor regarding these findings.  As we discussed, all you declined head imaging.  Please return emergency department immediately if you have vomiting, vision changes, difficulty moving her arms or legs or any other worsening or concerning symptoms.  You can take Tylenol or Ibuprofen as directed for pain. You can alternate Tylenol and Ibuprofen every 4 hours. If you take Tylenol at 1pm, then you can take Ibuprofen at 5pm. Then you can take Tylenol again at 9pm.   Take pain medications as directed for break through pain. Do not drive or operate machinery while taking this medication.   As we discussed, use incentive spirometer as directed.  Follow-up with your primary care doctor in the next 3 to 4 days.  Return the emergency department for any worsening pain, trouble breathing, fevers or any other worsening or concerning symptoms.
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
## 2018-11-01 NOTE — ED Notes (Signed)
Patient in CT

## 2018-11-01 NOTE — ED Notes (Signed)
Wounds to R arm cleaned, debris removed. No acute lacerations noted.

## 2018-11-13 ENCOUNTER — Encounter: Payer: Self-pay | Admitting: Cardiology

## 2018-11-13 NOTE — Progress Notes (Signed)
Remote ICD transmission.   

## 2018-11-19 ENCOUNTER — Other Ambulatory Visit: Payer: Medicaid Other

## 2018-12-01 LAB — CUP PACEART REMOTE DEVICE CHECK
Battery Remaining Longevity: 6 mo
Battery Voltage: 2.84 V
Brady Statistic AP VP Percent: 79.18 %
Brady Statistic AP VS Percent: 0.05 %
Brady Statistic AS VP Percent: 20.63 %
Brady Statistic AS VS Percent: 0.13 %
Brady Statistic RA Percent Paced: 78.99 %
Brady Statistic RV Percent Paced: 99.48 %
Date Time Interrogation Session: 20201003175623
HighPow Impedance: 76 Ohm
Implantable Lead Implant Date: 20150904
Implantable Lead Implant Date: 20150904
Implantable Lead Implant Date: 20150904
Implantable Lead Location: 753858
Implantable Lead Location: 753859
Implantable Lead Location: 753860
Implantable Lead Model: 4298
Implantable Lead Model: 5076
Implantable Lead Model: 6935
Implantable Pulse Generator Implant Date: 20150904
Lead Channel Impedance Value: 342 Ohm
Lead Channel Impedance Value: 418 Ohm
Lead Channel Impedance Value: 437 Ohm
Lead Channel Impedance Value: 494 Ohm
Lead Channel Impedance Value: 513 Ohm
Lead Channel Impedance Value: 589 Ohm
Lead Channel Impedance Value: 627 Ohm
Lead Channel Impedance Value: 646 Ohm
Lead Channel Impedance Value: 684 Ohm
Lead Channel Impedance Value: 684 Ohm
Lead Channel Impedance Value: 722 Ohm
Lead Channel Impedance Value: 798 Ohm
Lead Channel Impedance Value: 874 Ohm
Lead Channel Pacing Threshold Amplitude: 0.5 V
Lead Channel Pacing Threshold Amplitude: 0.875 V
Lead Channel Pacing Threshold Amplitude: 1 V
Lead Channel Pacing Threshold Pulse Width: 0.4 ms
Lead Channel Pacing Threshold Pulse Width: 0.4 ms
Lead Channel Pacing Threshold Pulse Width: 0.4 ms
Lead Channel Sensing Intrinsic Amplitude: 3.75 mV
Lead Channel Sensing Intrinsic Amplitude: 3.75 mV
Lead Channel Sensing Intrinsic Amplitude: 3.875 mV
Lead Channel Sensing Intrinsic Amplitude: 3.875 mV
Lead Channel Setting Pacing Amplitude: 1.5 V
Lead Channel Setting Pacing Amplitude: 1.75 V
Lead Channel Setting Pacing Amplitude: 2 V
Lead Channel Setting Pacing Pulse Width: 0.4 ms
Lead Channel Setting Pacing Pulse Width: 0.4 ms
Lead Channel Setting Sensing Sensitivity: 0.3 mV

## 2018-12-02 ENCOUNTER — Ambulatory Visit (INDEPENDENT_AMBULATORY_CARE_PROVIDER_SITE_OTHER): Payer: Medicaid Other | Admitting: *Deleted

## 2018-12-02 DIAGNOSIS — I447 Left bundle-branch block, unspecified: Secondary | ICD-10-CM

## 2018-12-02 DIAGNOSIS — I4891 Unspecified atrial fibrillation: Secondary | ICD-10-CM

## 2018-12-11 NOTE — Progress Notes (Signed)
Remote ICD transmission.   

## 2019-01-02 ENCOUNTER — Ambulatory Visit (INDEPENDENT_AMBULATORY_CARE_PROVIDER_SITE_OTHER): Payer: Medicaid Other | Admitting: *Deleted

## 2019-01-02 DIAGNOSIS — I428 Other cardiomyopathies: Secondary | ICD-10-CM

## 2019-01-02 DIAGNOSIS — I4891 Unspecified atrial fibrillation: Secondary | ICD-10-CM

## 2019-01-02 LAB — CUP PACEART REMOTE DEVICE CHECK
Battery Remaining Longevity: 6 mo
Battery Voltage: 2.8 V
Brady Statistic AP VP Percent: 69.48 %
Brady Statistic AP VS Percent: 0.05 %
Brady Statistic AS VP Percent: 30.37 %
Brady Statistic AS VS Percent: 0.1 %
Brady Statistic RA Percent Paced: 69.34 %
Brady Statistic RV Percent Paced: 99.54 %
Date Time Interrogation Session: 20201105114306
HighPow Impedance: 88 Ohm
Implantable Lead Implant Date: 20150904
Implantable Lead Implant Date: 20150904
Implantable Lead Implant Date: 20150904
Implantable Lead Location: 753858
Implantable Lead Location: 753859
Implantable Lead Location: 753860
Implantable Lead Model: 4298
Implantable Lead Model: 5076
Implantable Lead Model: 6935
Implantable Pulse Generator Implant Date: 20150904
Lead Channel Impedance Value: 399 Ohm
Lead Channel Impedance Value: 475 Ohm
Lead Channel Impedance Value: 475 Ohm
Lead Channel Impedance Value: 513 Ohm
Lead Channel Impedance Value: 570 Ohm
Lead Channel Impedance Value: 646 Ohm
Lead Channel Impedance Value: 684 Ohm
Lead Channel Impedance Value: 703 Ohm
Lead Channel Impedance Value: 703 Ohm
Lead Channel Impedance Value: 779 Ohm
Lead Channel Impedance Value: 779 Ohm
Lead Channel Impedance Value: 836 Ohm
Lead Channel Impedance Value: 874 Ohm
Lead Channel Pacing Threshold Amplitude: 0.375 V
Lead Channel Pacing Threshold Amplitude: 0.75 V
Lead Channel Pacing Threshold Amplitude: 1.125 V
Lead Channel Pacing Threshold Pulse Width: 0.4 ms
Lead Channel Pacing Threshold Pulse Width: 0.4 ms
Lead Channel Pacing Threshold Pulse Width: 0.4 ms
Lead Channel Sensing Intrinsic Amplitude: 4 mV
Lead Channel Sensing Intrinsic Amplitude: 4.375 mV
Lead Channel Setting Pacing Amplitude: 1.5 V
Lead Channel Setting Pacing Amplitude: 1.5 V
Lead Channel Setting Pacing Amplitude: 2.25 V
Lead Channel Setting Pacing Pulse Width: 0.4 ms
Lead Channel Setting Pacing Pulse Width: 0.4 ms
Lead Channel Setting Sensing Sensitivity: 0.3 mV

## 2019-01-16 NOTE — Progress Notes (Signed)
Remote ICD transmission.   

## 2019-02-03 ENCOUNTER — Ambulatory Visit (INDEPENDENT_AMBULATORY_CARE_PROVIDER_SITE_OTHER): Payer: Medicaid Other | Admitting: *Deleted

## 2019-02-03 DIAGNOSIS — I428 Other cardiomyopathies: Secondary | ICD-10-CM

## 2019-02-04 ENCOUNTER — Telehealth: Payer: Self-pay | Admitting: Emergency Medicine

## 2019-02-04 LAB — CUP PACEART REMOTE DEVICE CHECK
Date Time Interrogation Session: 20201207165334
Implantable Lead Implant Date: 20150904
Implantable Lead Implant Date: 20150904
Implantable Lead Implant Date: 20150904
Implantable Lead Location: 753858
Implantable Lead Location: 753859
Implantable Lead Location: 753860
Implantable Lead Model: 4298
Implantable Lead Model: 5076
Implantable Lead Model: 6935
Implantable Pulse Generator Implant Date: 20150904

## 2019-02-04 NOTE — Telephone Encounter (Signed)
Notified patient that battery at [facility] and that Dr Olin Pia scheduler will be calling with appointment to discuss generator change.

## 2019-02-10 NOTE — Telephone Encounter (Signed)
Noted  

## 2019-02-13 NOTE — Progress Notes (Deleted)
Cardiology Office Note Date:  02/13/2019  Patient ID:  Gary Kirk, DOB August 08, 1965, MRN [id number] PCP:  [name], FNP  Cardiologist:  Dr. Rockey Situ Electrophysiologist: Dr. Caryl Comes >>> moved to [facility] >> Dr. Lovena Le  ***refresh   Chief Complaint: *** device at ERI  History of Present Illness: Gary Kirk is a 53 y.o. male with history of NICM w/ICD, HTN, hypothyroidism, OSA (intolerant of CPAP), COPD, depression, regular marijuana use (daily) and AFib    12/26/2017 shocked by his device, no pre shock symptoms, was rushing at the store, he was shocked twice, felt some what clammy and sick to his stomach after the 2nd shock but well otherwise. EP saw him, shocks were inappropriate for rapid Afib. Post shock he was in SR, his base rate decreased to allow SR, his coreg increased, He had a single VF zone at 180 bpm. Dr. Rayann Heman increased this to 214 bpm to hopefully minimize inappropriate shocks.  Felt ultimately, he may benefit from AV nodal ablation, but deferred this decision to outpatient EP team.  He saw Dr. Josefine Class nove 2019, he was maintaining SR, no changes were made, noting about a year left to ERI.  He comes in today having now reached ERI.  *** symptoms *** rhythm? AF burden *** % BV *** volume status *** bleeding, Eliquis *** risks, benefits, hold eliquis *** meds, CM *** update echo??   Device information: MDT CRT-D, implanted 10/31/13    Past Medical History:  Diagnosis Date  . AICD (automatic cardioverter/defibrillator) present    a. 10/2013 s/p MDT [id number] BiV ICD (ser# [id number]).  . COPD (chronic obstructive pulmonary disease) ([place])    a. Quit smoking in 2015.  Marland Kitchen HFrEF (heart failure with reduced ejection fraction) ([place])    a. 02/2012 Echo: EF "depressed";  b. 03/2013 Echo: Ef 15%, mod to sev LV dil w/ anteroseptal AK and sev glob HK. Dilated RV w/ reduced fxn. Mild to mod RAE, Sev LAE. Mild TR; c. 11/2013 Echo: EF 10-15%, redcued RV fxn,  mild BAE, mild TR.  Marland Kitchen History of ileus   . Hypertension   . Hypokalemia   . Hypothyroidism   . Major depression   . NICM (nonischemic cardiomyopathy) ([place])    a. 02/2012 Echo: depressed EF; b. 02/2012 MV: EF 38%, small fixed apical defect, no ischemia; c. 03/2013 Echo: EF 15%; d. 08/2013 MV: apical ant, septal inf fixed defects, no ischemia; e. 10/2013 s/p MDT [id number] BiV ICD; d. 11/2013 Cath: results not avail, reportedly nl cors; e. 09/2014 MV: Fixed inf defect consistent w/ diaphragmatic atten. No ischemia. EF 28%.  . Permanent atrial fibrillation    a. CHA2DS2VASc = 2-->Eliquis.  . Sleep apnea    a. Doesn't tolerate CPAP.    Past Surgical History:  Procedure Laterality Date  . CARDIAC DEFIBRILLATOR PLACEMENT    . PACEMAKER IMPLANT      Current Outpatient Medications  Medication Sig Dispense Refill  . apixaban (ELIQUIS) 5 MG TABS tablet Take 1 tablet (5 mg total) by mouth 2 (two) times daily. 180 tablet 3  . carvedilol (COREG) 12.5 MG tablet Take 1 tablet (12.5 mg total) by mouth 2 (two) times daily with a meal. 60 tablet 6  . gabapentin (NEURONTIN) 300 MG capsule Take 300 mg by mouth 2 (two) times daily.    Marland Kitchen HYDROcodone-acetaminophen (NORCO/VICODIN) 5-325 MG tablet Take 1-2 tablets by mouth every 8 (eight) hours as needed. 10 tablet 0  . levothyroxine (SYNTHROID) 112 MCG tablet Take  1 tablet (112 mcg total) by mouth daily before breakfast. 30 tablet 1  . losartan (COZAAR) 25 MG tablet Take 1 tablet (25 mg total) by mouth daily. 90 tablet 3  . PROAIR HFA 108 (90 Base) MCG/ACT inhaler Inhale 2 puffs into the lungs every 4 (four) hours as needed for wheezing or shortness of breath.   3   No current facility-administered medications for this visit.    Allergies:   Aripiprazole, Tramadol, and Ibuprofen   Social History:  The patient  reports that he quit smoking about 4 years ago. His smoking use included cigarettes. He has never used smokeless tobacco. He reports current drug use. Drug:  Marijuana. He reports that he does not drink alcohol.   Family History:  The patient's family history includes Atrial fibrillation in his brother and mother; COPD in his mother; Cancer in his father; Heart failure in his mother; Hypertension in his mother; Lung cancer in his brother.  ROS:  Please see the history of present illness.  All other systems are reviewed and otherwise negative.   PHYSICAL EXAM: *** VS:  There were no vitals taken for this visit. BMI: There is no height or weight on file to calculate BMI. Well nourished, well developed, in no acute distress  HEENT: normocephalic, atraumatic  Neck: no JVD, carotid bruits or masses Cardiac:  *** RRR; no significant murmurs, no rubs, or gallops Lungs:  *** CTA b/l, no wheezing, rhonchi or rales  Abd: soft, nontender MS: no deformity or *** atrophy Ext: *** no edema  Skin: warm and dry, no rash Neuro:  No gross deficits appreciated Psych: euthymic mood, full affect  *** ICD site is stable, no tethering or discomfort   EKG:  Done today and reviewed by myself shows ***  ICD interrogation done today and reviewed by myself: ***   12/02/13: TTE IMPRESSIONS  Left ventricular dilatation. Left ventricular wall thickness mildly increased. Global left ventricular hypokinesis. Severely reduced  global left ventricular systolic function. Severely abnormal left ventricular ejection fraction estimated at 10-15%.  Catheter/pacemaker wire in the right ventricular cavity.  Mild right ventricular dilatation. Reduced right ventricular global systolic function.  Catheter/pacemaker wire in the right atrial cavity.  Mild right atrial dilatation. Area 19cm2.  Mild left atrial dilatation.  Normal valves.  Mild tricuspid regurgitation. Estimated PASP 19mmHg.  Normal pericardium. No pericardial effusion.  Normal size aortic root and proximal ascending aorta. Dilated IVC with decreased respiratory variation.  FINDINGS  Left Ventricle Left  ventricular dilatation. Left ventricular wall thickness mildly increased. Global left ventricular  hypokinesis. Severely reduced global left ventricular systolic function. Severely abnormal left ventricular  ejection fraction estimated at 10-15%. Septal E/E' ratio is 14.1 indicating normal filling pressure.  Right Ventricle Catheter/pacemaker wire in the right ventricular cavity.  Mild right ventricular dilatation. Reduced right ventricular global systolic function.  Right Atrium Catheter/pacemaker wire in the right atrial cavity.  Mild right atrial dilatation. Area 19cm2  Left Atrium Mild left atrial dilatation. Area 23cm2  Mitral Valve Normal function and mobility of the mitral valve leaflets. Trace mitral regurgitation.  Aortic Valve Structurally normal trileaflet aortic valve. No aortic regurgitation.  Tricuspid Valve Structurally normal tricuspid valve. Mild tricuspid regurgitation. Estimated PASP 63mmHg.  Pulmonic Valve Structurally normal pulmonic valve.  Pericardium Normal pericardium. No pericardial effusion.  Vessels Normal size aortic root and proximal ascending aorta. Dilated IVC with decreased respiratory variation   Recent Labs: 03/21/2018: ALT 17 11/01/2018: BUN 12; Creatinine, Ser 1.03; Hemoglobin 14.3; Platelets 177;  Potassium 3.1; Sodium 135  No results found for requested labs within last 8760 hours.   CrCl cannot be calculated (Patient's most recent lab result is older than the maximum 21 days allowed.).   Wt Readings from Last 3 Encounters:  11/01/18 (!) 302 lb (137 kg)  09/02/18 (!) 307 lb (139.3 kg)  03/21/18 (!) 310 lb (140.6 kg)     Other studies reviewed: Additional studies/records reviewed today include: summarized above  ASSESSMENT AND PLAN:  1. ICD     ***  2. NICM 3. Chronic CHF (systolic)     ***  4. Persistent AFib     CHA2DS2Vasc is 2, on Eliquis, *** appropriately dosed     *** % burden   5. HTN     ***     Disposition: F/u with  ***  Current medicines are reviewed at length with the patient today.  The patient did not have any concerns regarding medicines.***  Signed, Tommye Standard, PA-C 02/13/2019 5:26 AM     [facility] [address] [place] [address] [phone] (office)  [phone] (fax)

## 2019-02-14 ENCOUNTER — Telehealth: Payer: Self-pay

## 2019-02-14 ENCOUNTER — Encounter: Payer: Medicaid Other | Admitting: Physician Assistant

## 2019-02-14 NOTE — Telephone Encounter (Signed)
Pt states he missed his ride. I rescheduled the pt for 12/31 at 11:45 am.

## 2019-02-25 NOTE — Progress Notes (Signed)
Cardiology Office Note Date:  02/27/2019  Patient ID:  Gary Kirk, DOB Apr 24, 1965, MRN [id number] PCP:  [name], FNP  Cardiologist:  Dr. Rockey Situ Electrophysiologist: Dr. Caryl Comes      Chief Complaint: device at ERI  History of Present Illness: Gary Kirk is a 53 y.o. male with history of NICM w/ICD, HTN, hypothyroidism, OSA (intolerant of CPAP), COPD, depression, regular marijuana use (daily) and AFib    12/26/2017 shocked by his device, no pre shock symptoms, was rushing at the store, he was shocked twice, felt some what clammy and sick to his stomach after the 2nd shock but well otherwise. EP saw him, shocks were inappropriate for rapid Afib. Post shock he was in SR, his base rate decreased to allow SR, his coreg increased, He had a single VF zone at 180 bpm. Dr. Rayann Heman increased this to 214 bpm to hopefully minimize inappropriate shocks.  Felt ultimately, he may benefit from AV nodal ablation, but deferred this decision to outpatient EP team.  He saw Dr. Josefine Class nov 2019, he was maintaining SR, no changes were made, noting about a year left to ERI.  He comes in today having now scheduled as an ERI visit  He is doing quite well.  He denies any kind of CP, palpitations or cardiac awareness, no rest SOB, no symptoms of PND or orthopnea, he has some degree of DOE at baseline, but not new, not escalating.  In comparrison to 2015 when his device implanted, he feels much better.   No further shocks, no dizzy spells, no near syncope or syncope  He reports compliance withhis medicines, no  Bleeding or signs of bleeding  He had last seen Dr. Lovena Le given [facility] was closer for him at that time, though now, [facility] is convenient and he would like to have his visits here again with Dr. Caryl Comes.   Device information: MDT CRT-D, implanted 10/31/13    Past Medical History:  Diagnosis Date  . AICD (automatic cardioverter/defibrillator) present    a. 10/2013 s/p MDT  [id number] BiV ICD (ser# [id number]).  . COPD (chronic obstructive pulmonary disease) ([place])    a. Quit smoking in 2015.  Marland Kitchen HFrEF (heart failure with reduced ejection fraction) ([place])    a. 02/2012 Echo: EF "depressed";  b. 03/2013 Echo: Ef 15%, mod to sev LV dil w/ anteroseptal AK and sev glob HK. Dilated RV w/ reduced fxn. Mild to mod RAE, Sev LAE. Mild TR; c. 11/2013 Echo: EF 10-15%, redcued RV fxn, mild BAE, mild TR.  Marland Kitchen History of ileus   . Hypertension   . Hypokalemia   . Hypothyroidism   . Major depression   . NICM (nonischemic cardiomyopathy) ([place])    a. 02/2012 Echo: depressed EF; b. 02/2012 MV: EF 38%, small fixed apical defect, no ischemia; c. 03/2013 Echo: EF 15%; d. 08/2013 MV: apical ant, septal inf fixed defects, no ischemia; e. 10/2013 s/p MDT [id number] BiV ICD; d. 11/2013 Cath: results not avail, reportedly nl cors; e. 09/2014 MV: Fixed inf defect consistent w/ diaphragmatic atten. No ischemia. EF 28%.  . Permanent atrial fibrillation    a. CHA2DS2VASc = 2-->Eliquis.  . Sleep apnea    a. Doesn't tolerate CPAP.    Past Surgical History:  Procedure Laterality Date  . CARDIAC DEFIBRILLATOR PLACEMENT    . PACEMAKER IMPLANT      Current Outpatient Medications  Medication Sig Dispense Refill  . apixaban (ELIQUIS) 5 MG TABS tablet Take 1 tablet (5 mg total)  by mouth 2 (two) times daily. 180 tablet 3  . carvedilol (COREG) 12.5 MG tablet Take 1 tablet (12.5 mg total) by mouth 2 (two) times daily with a meal. 60 tablet 6  . gabapentin (NEURONTIN) 300 MG capsule Take 300 mg by mouth 2 (two) times daily.    Marland Kitchen levothyroxine (SYNTHROID) 112 MCG tablet Take 1 tablet (112 mcg total) by mouth daily before breakfast. 30 tablet 1  . losartan (COZAAR) 25 MG tablet Take 1 tablet (25 mg total) by mouth daily. 90 tablet 3  . PROAIR HFA 108 (90 Base) MCG/ACT inhaler Inhale 2 puffs into the lungs every 4 (four) hours as needed for wheezing or shortness of breath.   3  . SPIRIVA HANDIHALER 18 MCG inhalation  capsule 1 capsule daily.    . SYMBICORT 160-4.5 MCG/ACT inhaler Inhale 2 puffs into the lungs 2 (two) times daily.     No current facility-administered medications for this visit.    Allergies:   Aripiprazole, Tramadol, and Ibuprofen   Social History:  The patient  reports that he quit smoking about 4 years ago. His smoking use included cigarettes. He has never used smokeless tobacco. He reports current drug use. Drug: Marijuana. He reports that he does not drink alcohol.   Family History:  The patient's family history includes Atrial fibrillation in his brother and mother; COPD in his mother; Cancer in his father; Heart failure in his mother; Hypertension in his mother; Lung cancer in his brother.  ROS:  Please see the history of present illness.  All other systems are reviewed and otherwise negative.   PHYSICAL EXAM:  VS:  BP (!) 144/76   Pulse 73   Ht 6\' 1"  (1.854 m)   Wt (!) 308 lb (139.7 kg)   BMI 40.64 kg/m  BMI: Body mass index is 40.64 kg/m. Well nourished, well developed, in no acute distress  HEENT: normocephalic, atraumatic  Neck: no JVD, carotid bruits or masses Cardiac:  RRR; no significant murmurs, no rubs, or gallops Lungs:  CTA b/l, no wheezing, rhonchi or rales  Abd: soft, nontender, obese MS: no deformity or atrophy Ext: no edema  Skin: warm and dry, no rash Neuro:  No gross deficits appreciated Psych: euthymic mood, full affect  ICD site is stable, no tethering or discomfort   EKG:  Done today and reviewed by myself shows SR, Vp aced, couple AV paced beats  ICD interrogation done today and reviewed by myself:  Battery estimate is 3 months to ERI Lead measurements are good No arrhythmias 99.6%VP   12/02/13: TTE IMPRESSIONS  Left ventricular dilatation. Left ventricular wall thickness mildly increased. Global left ventricular hypokinesis. Severely reduced  global left ventricular systolic function. Severely abnormal left ventricular ejection fraction  estimated at 10-15%.  Catheter/pacemaker wire in the right ventricular cavity.  Mild right ventricular dilatation. Reduced right ventricular global systolic function.  Catheter/pacemaker wire in the right atrial cavity.  Mild right atrial dilatation. Area 19cm2.  Mild left atrial dilatation.  Normal valves.  Mild tricuspid regurgitation. Estimated PASP 11mmHg.  Normal pericardium. No pericardial effusion.  Normal size aortic root and proximal ascending aorta. Dilated IVC with decreased respiratory variation.  FINDINGS  Left Ventricle Left ventricular dilatation. Left ventricular wall thickness mildly increased. Global left ventricular  hypokinesis. Severely reduced global left ventricular systolic function. Severely abnormal left ventricular  ejection fraction estimated at 10-15%. Septal E/E' ratio is 14.1 indicating normal filling pressure.  Right Ventricle Catheter/pacemaker wire in the right ventricular cavity.  Mild right ventricular dilatation. Reduced right ventricular global systolic function.  Right Atrium Catheter/pacemaker wire in the right atrial cavity.  Mild right atrial dilatation. Area 19cm2  Left Atrium Mild left atrial dilatation. Area 23cm2  Mitral Valve Normal function and mobility of the mitral valve leaflets. Trace mitral regurgitation.  Aortic Valve Structurally normal trileaflet aortic valve. No aortic regurgitation.  Tricuspid Valve Structurally normal tricuspid valve. Mild tricuspid regurgitation. Estimated PASP 65mmHg.  Pulmonic Valve Structurally normal pulmonic valve.  Pericardium Normal pericardium. No pericardial effusion.  Vessels Normal size aortic root and proximal ascending aorta. Dilated IVC with decreased respiratory variation   Recent Labs: 03/21/2018: ALT 17 11/01/2018: BUN 12; Creatinine, Ser 1.03; Hemoglobin 14.3; Platelets 177; Potassium 3.1; Sodium 135  No results found for requested labs within last 8760 hours.   CrCl cannot be calculated  (Patient's most recent lab result is older than the maximum 21 days allowed.).   Wt Readings from Last 3 Encounters:  02/27/19 (!) 308 lb (139.7 kg)  11/01/18 (!) 302 lb (137 kg)  09/02/18 (!) 307 lb (139.3 kg)     Other studies reviewed: Additional studies/records reviewed today include: summarized above  ASSESSMENT AND PLAN:  1. ICD     Intact function     He has not reached RRT yet  We did discussed the gen change procedure today though, the potential risks and benefits.  He is agreeable to proceed, once his device battery has reached RRT indicator  2. NICM 3. Chronic CHF (systolic)     No symptoms or exam findings to suggest folume OL     OptiVol looks good     On BB ARB  He feels much better then bqack ini 2015 Some baseline DOE perhaps more to do with body habitus? Given he is nearing RRT, discussed updating his echo to better discuss device at gen change, that being said given his young age, may be inclined to keep CRT-D.  Pending his echo, we will discuss further at his f/u visit prior to his gen change.  4. Persistent AFib     CHA2DS2Vasc is 2, on Eliquis,  appropriately dosed     0% burden   5. HTN     No changes today     Disposition: F/u with monthly battery remote checks and will do a virtual visit in 3 mo, discuss his echo, generator change/device recommendations/decisions.  Current medicines are reviewed at length with the patient today.  The patient did not have any concerns regarding medicines.  Venetia Night, PA-C 02/27/2019 12:20 PM     [address] [place] [address] [phone] (office)  [phone] (fax)

## 2019-02-27 ENCOUNTER — Ambulatory Visit (INDEPENDENT_AMBULATORY_CARE_PROVIDER_SITE_OTHER): Payer: Medicaid Other | Admitting: Physician Assistant

## 2019-02-27 ENCOUNTER — Other Ambulatory Visit: Payer: Self-pay

## 2019-02-27 VITALS — BP 144/76 | HR 73 | Ht 73.0 in | Wt 308.0 lb

## 2019-02-27 DIAGNOSIS — I5022 Chronic systolic (congestive) heart failure: Secondary | ICD-10-CM | POA: Diagnosis not present

## 2019-02-27 DIAGNOSIS — I4891 Unspecified atrial fibrillation: Secondary | ICD-10-CM

## 2019-02-27 DIAGNOSIS — I480 Paroxysmal atrial fibrillation: Secondary | ICD-10-CM

## 2019-02-27 DIAGNOSIS — I428 Other cardiomyopathies: Secondary | ICD-10-CM | POA: Diagnosis not present

## 2019-02-27 DIAGNOSIS — Z95810 Presence of automatic (implantable) cardiac defibrillator: Secondary | ICD-10-CM

## 2019-02-27 DIAGNOSIS — I10 Essential (primary) hypertension: Secondary | ICD-10-CM

## 2019-02-27 NOTE — Patient Instructions (Signed)
Medication Instructions:  Your physician recommends that you continue on your current medications as directed. Please refer to the Current Medication list given to you today.  *If you need a refill on your cardiac medications before your next appointment, please call your pharmacy*  Lab Work:  [place]   If you have labs (blood work) drawn today and your tests are completely normal, you will receive your results only by: Marland Kitchen MyChart Message (if you have MyChart) OR . A paper copy in the mail If you have any lab test that is abnormal or we need to change your treatment, we will call you to review the results.  Testing/Procedures: Your physician has requested that you have an echocardiogram. Echocardiography is a painless test that uses sound waves to create images of your heart. It provides your doctor with information about the size and shape of your heart and how well your heart's chambers and valves are working. This procedure takes approximately one hour. There are no restrictions for this procedure.   Follow-Up: At [facility], you and your health needs are our priority.  As part of our continuing mission to provide you with exceptional heart care, we have created designated Provider Care Teams.  These Care Teams include your primary Cardiologist (physician) and Advanced Practice Providers (APPs -  Physician Assistants and Nurse Practitioners) who all work together to provide you with the care you need, when you need it.  Your next appointment:   3 month(s)  The format for your next appointment:   In Person  Provider:   You may see Cristopher Peru, MD or one of the following Advanced Practice Providers on your designated Care Team:    Chanetta Marshall, NP  Tommye Standard, PA-C  Legrand Como "[place]da [place], [place]   Other Instructions

## 2019-03-02 NOTE — Progress Notes (Signed)
ICD remote 

## 2019-03-02 NOTE — Addendum Note (Signed)
Addended by: Oralia Rud on: 03/02/2019 07:41 PM   Modules accepted: Level of Service

## 2019-03-06 ENCOUNTER — Ambulatory Visit (INDEPENDENT_AMBULATORY_CARE_PROVIDER_SITE_OTHER): Payer: Medicaid Other | Admitting: *Deleted

## 2019-03-06 DIAGNOSIS — I4891 Unspecified atrial fibrillation: Secondary | ICD-10-CM | POA: Diagnosis not present

## 2019-03-06 LAB — CUP PACEART REMOTE DEVICE CHECK
Battery Remaining Longevity: 3 mo
Battery Voltage: 2.81 V
Brady Statistic AP VP Percent: 72.28 %
Brady Statistic AP VS Percent: 0.06 %
Brady Statistic AS VP Percent: 27.6 %
Brady Statistic AS VS Percent: 0.07 %
Brady Statistic RA Percent Paced: 72.07 %
Brady Statistic RV Percent Paced: 99.42 %
Date Time Interrogation Session: 20210107022604
HighPow Impedance: 79 Ohm
Implantable Lead Implant Date: 20150904
Implantable Lead Implant Date: 20150904
Implantable Lead Implant Date: 20150904
Implantable Lead Location: 753858
Implantable Lead Location: 753859
Implantable Lead Location: 753860
Implantable Lead Model: 4298
Implantable Lead Model: 5076
Implantable Lead Model: 6935
Implantable Pulse Generator Implant Date: 20150904
Lead Channel Impedance Value: 342 Ohm
Lead Channel Impedance Value: 399 Ohm
Lead Channel Impedance Value: 475 Ohm
Lead Channel Impedance Value: 513 Ohm
Lead Channel Impedance Value: 551 Ohm
Lead Channel Impedance Value: 589 Ohm
Lead Channel Impedance Value: 627 Ohm
Lead Channel Impedance Value: 627 Ohm
Lead Channel Impedance Value: 703 Ohm
Lead Channel Impedance Value: 722 Ohm
Lead Channel Impedance Value: 760 Ohm
Lead Channel Impedance Value: 760 Ohm
Lead Channel Impedance Value: 874 Ohm
Lead Channel Pacing Threshold Amplitude: 0.5 V
Lead Channel Pacing Threshold Amplitude: 0.625 V
Lead Channel Pacing Threshold Amplitude: 0.875 V
Lead Channel Pacing Threshold Pulse Width: 0.4 ms
Lead Channel Pacing Threshold Pulse Width: 0.4 ms
Lead Channel Pacing Threshold Pulse Width: 0.4 ms
Lead Channel Sensing Intrinsic Amplitude: 4.375 mV
Lead Channel Sensing Intrinsic Amplitude: 4.375 mV
Lead Channel Sensing Intrinsic Amplitude: 4.625 mV
Lead Channel Sensing Intrinsic Amplitude: 4.625 mV
Lead Channel Setting Pacing Amplitude: 1.5 V
Lead Channel Setting Pacing Amplitude: 1.5 V
Lead Channel Setting Pacing Amplitude: 2 V
Lead Channel Setting Pacing Pulse Width: 0.4 ms
Lead Channel Setting Pacing Pulse Width: 0.4 ms
Lead Channel Setting Sensing Sensitivity: 0.3 mV

## 2019-03-13 ENCOUNTER — Ambulatory Visit (HOSPITAL_COMMUNITY): Payer: Medicaid Other | Attending: Cardiology

## 2019-03-13 ENCOUNTER — Other Ambulatory Visit: Payer: Self-pay

## 2019-03-13 DIAGNOSIS — I4891 Unspecified atrial fibrillation: Secondary | ICD-10-CM | POA: Diagnosis present

## 2019-04-07 ENCOUNTER — Ambulatory Visit (INDEPENDENT_AMBULATORY_CARE_PROVIDER_SITE_OTHER): Payer: Medicaid Other | Admitting: *Deleted

## 2019-04-07 DIAGNOSIS — I4891 Unspecified atrial fibrillation: Secondary | ICD-10-CM

## 2019-04-07 LAB — CUP PACEART REMOTE DEVICE CHECK
Battery Remaining Longevity: 2 mo
Battery Voltage: 2.78 V
Brady Statistic AP VP Percent: 70.13 %
Brady Statistic AP VS Percent: 0.05 %
Brady Statistic AS VP Percent: 29.52 %
Brady Statistic AS VS Percent: 0.3 %
Brady Statistic RA Percent Paced: 70.01 %
Brady Statistic RV Percent Paced: 99.33 %
Date Time Interrogation Session: 20210208022604
HighPow Impedance: 84 Ohm
Implantable Lead Implant Date: 20150904
Implantable Lead Implant Date: 20150904
Implantable Lead Implant Date: 20150904
Implantable Lead Location: 753858
Implantable Lead Location: 753859
Implantable Lead Location: 753860
Implantable Lead Model: 4298
Implantable Lead Model: 5076
Implantable Lead Model: 6935
Implantable Pulse Generator Implant Date: 20150904
Lead Channel Impedance Value: 399 Ohm
Lead Channel Impedance Value: 475 Ohm
Lead Channel Impedance Value: 494 Ohm
Lead Channel Impedance Value: 551 Ohm
Lead Channel Impedance Value: 570 Ohm
Lead Channel Impedance Value: 627 Ohm
Lead Channel Impedance Value: 684 Ohm
Lead Channel Impedance Value: 684 Ohm
Lead Channel Impedance Value: 722 Ohm
Lead Channel Impedance Value: 760 Ohm
Lead Channel Impedance Value: 760 Ohm
Lead Channel Impedance Value: 836 Ohm
Lead Channel Impedance Value: 912 Ohm
Lead Channel Pacing Threshold Amplitude: 0.375 V
Lead Channel Pacing Threshold Amplitude: 0.625 V
Lead Channel Pacing Threshold Amplitude: 0.875 V
Lead Channel Pacing Threshold Pulse Width: 0.4 ms
Lead Channel Pacing Threshold Pulse Width: 0.4 ms
Lead Channel Pacing Threshold Pulse Width: 0.4 ms
Lead Channel Sensing Intrinsic Amplitude: 4.625 mV
Lead Channel Sensing Intrinsic Amplitude: 4.625 mV
Lead Channel Sensing Intrinsic Amplitude: 6 mV
Lead Channel Sensing Intrinsic Amplitude: 6 mV
Lead Channel Setting Pacing Amplitude: 1.5 V
Lead Channel Setting Pacing Amplitude: 1.5 V
Lead Channel Setting Pacing Amplitude: 2 V
Lead Channel Setting Pacing Pulse Width: 0.4 ms
Lead Channel Setting Pacing Pulse Width: 0.4 ms
Lead Channel Setting Sensing Sensitivity: 0.3 mV

## 2019-04-08 NOTE — Progress Notes (Signed)
ICD Remote  

## 2019-05-07 ENCOUNTER — Ambulatory Visit: Payer: Medicaid Other | Admitting: Nurse Practitioner

## 2019-05-08 ENCOUNTER — Ambulatory Visit (INDEPENDENT_AMBULATORY_CARE_PROVIDER_SITE_OTHER): Payer: Medicaid Other | Admitting: *Deleted

## 2019-05-08 DIAGNOSIS — I4891 Unspecified atrial fibrillation: Secondary | ICD-10-CM | POA: Diagnosis not present

## 2019-05-08 LAB — CUP PACEART REMOTE DEVICE CHECK
Battery Remaining Longevity: 2 mo
Battery Voltage: 2.79 V
Brady Statistic AP VP Percent: 63.5 %
Brady Statistic AP VS Percent: 0.05 %
Brady Statistic AS VP Percent: 35.97 %
Brady Statistic AS VS Percent: 0.48 %
Brady Statistic RA Percent Paced: 63.25 %
Brady Statistic RV Percent Paced: 98.96 %
Date Time Interrogation Session: 20210311044224
HighPow Impedance: 85 Ohm
Implantable Lead Implant Date: 20150904
Implantable Lead Implant Date: 20150904
Implantable Lead Implant Date: 20150904
Implantable Lead Location: 753858
Implantable Lead Location: 753859
Implantable Lead Location: 753860
Implantable Lead Model: 4298
Implantable Lead Model: 5076
Implantable Lead Model: 6935
Implantable Pulse Generator Implant Date: 20150904
Lead Channel Impedance Value: 361 Ohm
Lead Channel Impedance Value: 418 Ohm
Lead Channel Impedance Value: 437 Ohm
Lead Channel Impedance Value: 494 Ohm
Lead Channel Impedance Value: 589 Ohm
Lead Channel Impedance Value: 589 Ohm
Lead Channel Impedance Value: 684 Ohm
Lead Channel Impedance Value: 684 Ohm
Lead Channel Impedance Value: 684 Ohm
Lead Channel Impedance Value: 722 Ohm
Lead Channel Impedance Value: 760 Ohm
Lead Channel Impedance Value: 779 Ohm
Lead Channel Impedance Value: 836 Ohm
Lead Channel Pacing Threshold Amplitude: 0.375 V
Lead Channel Pacing Threshold Amplitude: 0.625 V
Lead Channel Pacing Threshold Amplitude: 0.75 V
Lead Channel Pacing Threshold Pulse Width: 0.4 ms
Lead Channel Pacing Threshold Pulse Width: 0.4 ms
Lead Channel Pacing Threshold Pulse Width: 0.4 ms
Lead Channel Sensing Intrinsic Amplitude: 5.125 mV
Lead Channel Sensing Intrinsic Amplitude: 5.125 mV
Lead Channel Sensing Intrinsic Amplitude: 7 mV
Lead Channel Sensing Intrinsic Amplitude: 7 mV
Lead Channel Setting Pacing Amplitude: 1.5 V
Lead Channel Setting Pacing Amplitude: 1.5 V
Lead Channel Setting Pacing Amplitude: 1.75 V
Lead Channel Setting Pacing Pulse Width: 0.4 ms
Lead Channel Setting Pacing Pulse Width: 0.4 ms
Lead Channel Setting Sensing Sensitivity: 0.3 mV

## 2019-05-09 NOTE — Progress Notes (Signed)
ICD Remote  

## 2019-05-22 NOTE — Progress Notes (Signed)
PCP:  [name], FNP  Cardiologist:  Ida Rogue, MD  Electrophysiologist:  Dr. Caryl Comes    Chief Complaint:   device nearing ERI, follow up on echo  History of Present Illness:    Gary Gary is a 54 y.o. male with history of NICM w/ICD, HTN, hypothyroidism, OSA (intolerant of CPAP), COPD, depression, regular marijuana use (daily) and AFib   12/26/2017 shocked by his device, no pre shock symptoms, was rushing at the store, he was shocked twice, felt some what clammy and sick to his stomach after the 2nd shock but well otherwise. EP saw him, shocks were inappropriate for rapid Afib. Post shock he was in SR, his base rate decreased to allow SR, his coreg increased, He had a single VF zone at 180 bpm. Gary Gary increased this to 214 bpm to hopefully minimize inappropriate shocks. Felt ultimately, he may benefit from AV nodal ablation, but deferred this decision to outpatient EP team.  He saw Gary Gary nov 2019, he was maintaining SR, no changes were made, noting about a year left to ERI.   I saw him Dec 2020 Was scheduled as an ERI visit  He was doing quite well.  He denied any kind of CP, palpitations or cardiac awareness, no rest SOB, no symptoms of PND or orthopnea, he had some degree of DOE at baseline, but not new, not escalating.  In comparrison to 2015 when his device implanted, he felt much better.   No further shocks, no dizzy spells, no near syncope or syncope He reported compliance withhis medicines, no  Bleeding or signs of bleeding He had last seen Gary Gary given Gary Gary was closer for him at that time, though now, Gary Gary is more convenient and said he would like to have his visits here again with Dr. Caryl Comes. His device was 3 months to ERI We discussed updating his echo prior to plans for gen change to better guide device choices, plans.  Though given his young age inclined to to plan change change, keeping CRT-D.  LVEF 55-60%, no  significant VHD, RV looks OK  TODAY He continues to feel well.  No CP, palpitations or SOB No DOE No dizzy spells, near syncope or syncope. No shocks  No bleeding.  He mentions that his stool has gotten "sticky" but black, no visible blood, this has caught his attention   Device information: MDT CRT-D, implanted 10/31/13   The patient does not have symptoms concerning for COVID-19 infection (fever, chills, cough, or new shortness of breath).    Past Medical History:  Diagnosis Date  . AICD (automatic cardioverter/defibrillator) present    a. 10/2013 s/p MDT [id number] BiV ICD (ser# [id number]).  . COPD (chronic obstructive pulmonary disease) ([place])    a. Quit smoking in 2015.  Marland Kitchen HFrEF (heart failure with reduced ejection fraction) ([place])    a. 02/2012 Echo: EF "depressed";  b. 03/2013 Echo: Ef 15%, mod to sev LV dil w/ anteroseptal AK and sev glob HK. Dilated RV w/ reduced fxn. Mild to mod RAE, Sev LAE. Mild TR; c. 11/2013 Echo: EF 10-15%, redcued RV fxn, mild BAE, mild TR.  Marland Kitchen History of ileus   . Hypertension   . Hypokalemia   . Hypothyroidism   . Major depression   . NICM (nonischemic cardiomyopathy) ([place])    a. 02/2012 Echo: depressed EF; b. 02/2012 MV: EF 38%, small fixed apical defect, no ischemia; c. 03/2013 Echo: EF 15%; d. 08/2013 MV: apical ant,  septal inf fixed defects, no ischemia; e. 10/2013 s/p MDT [id number] BiV ICD; d. 11/2013 Cath: results not avail, reportedly nl cors; e. 09/2014 MV: Fixed inf defect consistent w/ diaphragmatic atten. No ischemia. EF 28%.  . Permanent atrial fibrillation    a. CHA2DS2VASc = 2-->Gary Gary.  . Sleep apnea    a. Doesn't tolerate CPAP.   Past Surgical History:  Procedure Laterality Date  . CARDIAC DEFIBRILLATOR PLACEMENT    . PACEMAKER IMPLANT       No outpatient medications have been marked as taking for the 05/23/19 encounter (Appointment) with Baldwin Jamaica, PA-C.     Allergies:   Aripiprazole, Tramadol, and Ibuprofen   Social History    Tobacco Use  . Smoking status: Former Smoker    Types: Cigarettes    Quit date: 04/29/2014    Years since quitting: 5.0  . Smokeless tobacco: Never Used  Substance Use Topics  . Alcohol use: No  . Drug use: Yes    Types: Marijuana    Comment: last use last night     Family Hx: The patient's family history includes Atrial fibrillation in his brother and mother; COPD in his mother; Cancer in his father; Heart failure in his mother; Hypertension in his mother; Lung cancer in his brother.  ROS:   Please see the history of present illness.    All other systems reviewed and are negative.   Prior CV studies:   The following studies were reviewed today:   05/08/19: ICD transmission reviewed Battery estimate is 59mo to RRT Lead measurements are good No arrhythmias 99% BP VS events are SR with A in refractory  03/13/2019: TTE IMPRESSIONS  1. Left ventricular ejection fraction, by visual estimation, is 55 to  60%. The left ventricle has normal function. There is no left ventricular  hypertrophy.  2. The left ventricle demonstrates global hypokinesis.  3. Global right ventricle has normal systolic function.The right  ventricular size is normal. No increase in right ventricular wall  thickness.  4. Left atrial size was normal.  5. Right atrial size was normal.  6. The mitral valve is normal in structure. Trivial mitral valve  regurgitation.  7. The tricuspid valve is grossly normal.  8. The aortic valve is tricuspid. Aortic valve regurgitation is not  visualized. No evidence of aortic valve sclerosis or stenosis.  9. The pulmonic valve was not well visualized. Pulmonic valve  regurgitation is not visualized.  10. Aortic dilatation noted.  11. There is mild dilatation of the ascending aorta measuring 39 mm.  12. A pacer wire is visualized in the RA and RV.  13. The inferior vena cava is normal in size with greater than 50%  respiratory variability, suggesting right  atrial pressure of 3 mmHg.    12/02/13: TTE IMPRESSIONS Left ventricular dilatation. Left ventricular wall thickness mildly increased. Global left ventricular hypokinesis. Severely reduced global left ventricular systolic function. Severely abnormal left ventricular ejection fraction estimated at 10-15%. Catheter/pacemaker wire in the right ventricular cavity. Mild right ventricular dilatation. Reduced right ventricular global systolic function. Catheter/pacemaker wire in the right atrial cavity. Mild right atrial dilatation. Area 19cm2. Mild left atrial dilatation. Normal valves. Mild tricuspid regurgitation. Estimated PASP 26mmHg. Normal pericardium. No pericardial effusion. Normal size aortic root and proximal ascending aorta. Dilated IVC with decreased respiratory variation. FINDINGS Left Ventricle Left ventricular dilatation. Left ventricular wall thickness mildly increased. Global left ventricular hypokinesis. Severely reduced global left ventricular systolic function. Severely abnormal left ventricular ejection fraction estimated at  10-15%. Septal E/E' ratio is 14.1 indicating normal filling pressure. Right Ventricle Catheter/pacemaker wire in the right ventricular cavity. Mild right ventricular dilatation. Reduced right ventricular global systolic function. Right Atrium Catheter/pacemaker wire in the right atrial cavity. Mild right atrial dilatation. Area 19cm2 Left Atrium Mild left atrial dilatation. Area 23cm2 Mitral Valve Normal function and mobility of the mitral valve leaflets. Trace mitral regurgitation. Aortic Valve Structurally normal trileaflet aortic valve. No aortic regurgitation. Tricuspid Valve Structurally normal tricuspid valve. Mild tricuspid regurgitation. Estimated PASP 37mmHg. Pulmonic Valve Structurally normal pulmonic valve. Pericardium Normal pericardium. No pericardial effusion. Vessels Normal size aortic root and proximal  ascending aorta. Dilated IVC with decreased respiratory variation   Labs/Other Tests and Data Reviewed:    EKG:  Not done today   ICD interrogation done today and reviewed by myself: Battery estimate is one month to ERI Lead measurements are good No arrhythmias or therapies OptiVol is trending upwards  Recent Labs: 11/01/2018: BUN 12; Creatinine, Ser 1.03; Hemoglobin 14.3; Platelets 177; Potassium 3.1; Sodium 135   Recent Lipid Panel No results found for: CHOL, TRIG, HDL, CHOLHDL, LDLCALC, LDLDIRECT  Wt Readings from Last 3 Encounters:  02/27/19 (!) 308 lb (139.7 kg)  11/01/18 (!) 302 lb (137 kg)  09/02/18 (!) 307 lb (139.3 kg)     Objective:    Vital Signs:  There were no vitals taken for this visit.   Well nourished, well developed, in no acute distress  HEENT: normocephalic, atraumatic  Neck: no JVD, carotid bruits or masses Cardiac:  RRR; no significant murmurs, no rubs, or gallops Lungs:  CTA b/l, no wheezing, rhonchi or rales  Abd: soft, nontender, obese MS: no deformity or atrophy Ext: trace edema  Skin: warm and dry, no rash Neuro:  No gross deficits appreciated Psych: euthymic mood, full affect  ICD site is stable, no tethering or discomfort  ASSESSMENT & PLAN:     1. ICD     Intact function     He has not reached RRT yet  We did discussed the gen change procedure today again, the potential risks and benefits.  He is agreeable to proceed, once his device battery has reached RRT indicator  He is going to give his device some thought.  He feels like he will definitely want to keep CRT pacing given he has had such great recovery of his EF, though not sure if he wants to continue with defibrillator  He has had inappropriate shock for AFib in 2019 In review of older notes 1st available note by EP, Dr. Caryl Comes mentions h/o multiple inappropriate shocks for Afib    2. NICM 3. Chronic CHF (systolic)     His LV has recovered     No symptoms or exam  findings to suggest folume OL     On BB ARB  Optivol is trending upwards, though below threshold No exam findings or symptoms to suggest volume OL Discussed importance of minimizing salt despite the improvement of his EF, needs to stay diligent on diet, weight loss, and lifestyle modifications  4. Persistent AFib     CHA2DS2Vasc is 2, on Gary Gary,  appropriately dosed     0% burden     He has noted a change in his stool, not melena, but he is concerned, will check CBC and a BMET for his Gary Gary     He is instructed to monitor.   5. HTN     High, he has not taken his medicines today  Counseled      No changes today     Medication Adjustments/Labs and Tests Ordered: Current medicines are reviewed at length with the patient today.  Concerns regarding medicines are outlined above.   Tests Ordered: No orders of the defined types were placed in this encounter.   Medication Changes: No orders of the defined types were placed in this encounter.    Signed, Baldwin Jamaica, PA-C  05/22/2019 6:28 PM    [facility]

## 2019-05-23 ENCOUNTER — Telehealth: Payer: Self-pay | Admitting: *Deleted

## 2019-05-23 ENCOUNTER — Ambulatory Visit (INDEPENDENT_AMBULATORY_CARE_PROVIDER_SITE_OTHER): Payer: Medicaid Other | Admitting: Physician Assistant

## 2019-05-23 ENCOUNTER — Other Ambulatory Visit: Payer: Self-pay

## 2019-05-23 VITALS — BP 154/76 | HR 84 | Ht 74.0 in | Wt 306.0 lb

## 2019-05-23 DIAGNOSIS — I4819 Other persistent atrial fibrillation: Secondary | ICD-10-CM | POA: Diagnosis not present

## 2019-05-23 DIAGNOSIS — I5022 Chronic systolic (congestive) heart failure: Secondary | ICD-10-CM

## 2019-05-23 DIAGNOSIS — Z95810 Presence of automatic (implantable) cardiac defibrillator: Secondary | ICD-10-CM | POA: Diagnosis not present

## 2019-05-23 DIAGNOSIS — I428 Other cardiomyopathies: Secondary | ICD-10-CM | POA: Diagnosis not present

## 2019-05-23 DIAGNOSIS — I10 Essential (primary) hypertension: Secondary | ICD-10-CM | POA: Diagnosis not present

## 2019-05-23 DIAGNOSIS — Z79899 Other long term (current) drug therapy: Secondary | ICD-10-CM

## 2019-05-23 NOTE — Telephone Encounter (Signed)
Lvm of trying to get in touch with patient to prep for virtual appointment today

## 2019-05-23 NOTE — Patient Instructions (Signed)
Medication Instructions:   Your physician recommends that you continue on your current medications as directed. Please refer to the Current Medication list given to you today.  *If you need a refill on your cardiac medications before your next appointment, please call your pharmacy*   Lab Work: BMET AND CBC TODAY   If you have labs (blood work) drawn today and your tests are completely normal, you will receive your results only by: Marland Kitchen MyChart Message (if you have MyChart) OR . A paper copy in the mail If you have any lab test that is abnormal or we need to change your treatment, we will call you to review the results.   Testing/Procedures: NONE ORDERED  TODAY   Follow-Up: At [facility], you and your health needs are our priority.  As part of our continuing mission to provide you with exceptional heart care, we have created designated Provider Care Teams.  These Care Teams include your primary Cardiologist (physician) and Advanced Practice Providers (APPs -  Physician Assistants and Nurse Practitioners) who all work together to provide you with the care you need, when you need it.  We recommend signing up for the patient portal called "MyChart".  Sign up information is provided on this After Visit Summary.  MyChart is used to connect with patients for Virtual Visits (Telemedicine).  Patients are able to view lab/test results, encounter notes, upcoming appointments, etc.  Non-urgent messages can be sent to your provider as well.   To learn more about what you can do with MyChart, go to [email or web address].    Your next appointment:   6 month(s)  The format for your next appointment:   In Person  Provider:   You may see Cristopher Peru, MD   or one of the following Advanced Practice Providers on your designated Care Team:    Chanetta Marshall, NP  Tommye Standard, PA-C  Legrand Como "Oda Kilts, [place]    Other Instructions

## 2019-05-24 LAB — BASIC METABOLIC PANEL
BUN/Creatinine Ratio: 10 (ref 9–20)
BUN: 10 mg/dL (ref 6–24)
CO2: 22 mmol/L (ref 20–29)
Calcium: 9.5 mg/dL (ref 8.7–10.2)
Chloride: 102 mmol/L (ref 96–106)
Creatinine, Ser: 0.98 mg/dL (ref 0.76–1.27)
GFR calc Af Amer: 101 mL/min/{1.73_m2} (ref >59)
GFR calc non Af Amer: 87 mL/min/{1.73_m2} (ref >59)
Glucose: 92 mg/dL (ref 65–99)
Potassium: 4.2 mmol/L (ref 3.5–5.2)
Sodium: 138 mmol/L (ref 134–144)

## 2019-05-24 LAB — CBC
Hematocrit: 43.1 % (ref 37.5–51.0)
Hemoglobin: 15 g/dL (ref 13.0–17.7)
MCH: 31.6 pg (ref 26.6–33.0)
MCHC: 34.8 g/dL (ref 31.5–35.7)
MCV: 91 fL (ref 79–97)
Platelets: 187 10*3/uL (ref 150–450)
RBC: 4.75 x10E6/uL (ref 4.14–5.80)
RDW: 13.1 % (ref 11.6–15.4)
WBC: 9.6 10*3/uL (ref 3.4–10.8)

## 2019-05-28 ENCOUNTER — Encounter: Payer: Medicaid Other | Admitting: Student

## 2019-06-09 ENCOUNTER — Ambulatory Visit (INDEPENDENT_AMBULATORY_CARE_PROVIDER_SITE_OTHER): Payer: Medicaid Other | Admitting: *Deleted

## 2019-06-09 DIAGNOSIS — I4891 Unspecified atrial fibrillation: Secondary | ICD-10-CM

## 2019-06-11 ENCOUNTER — Telehealth: Payer: Self-pay

## 2019-06-11 LAB — CUP PACEART REMOTE DEVICE CHECK
Battery Remaining Longevity: 2 mo
Battery Voltage: 2.77 V
Brady Statistic AP VP Percent: 64.68 %
Brady Statistic AP VS Percent: 0.05 %
Brady Statistic AS VP Percent: 35.11 %
Brady Statistic AS VS Percent: 0.16 %
Brady Statistic RA Percent Paced: 64.62 %
Brady Statistic RV Percent Paced: 99.57 %
Date Time Interrogation Session: 20210413180050
HighPow Impedance: 80 Ohm
Implantable Lead Implant Date: 20150904
Implantable Lead Implant Date: 20150904
Implantable Lead Implant Date: 20150904
Implantable Lead Location: 753858
Implantable Lead Location: 753859
Implantable Lead Location: 753860
Implantable Lead Model: 4298
Implantable Lead Model: 5076
Implantable Lead Model: 6935
Implantable Pulse Generator Implant Date: 20150904
Lead Channel Impedance Value: 361 Ohm
Lead Channel Impedance Value: 437 Ohm
Lead Channel Impedance Value: 437 Ohm
Lead Channel Impedance Value: 513 Ohm
Lead Channel Impedance Value: 513 Ohm
Lead Channel Impedance Value: 589 Ohm
Lead Channel Impedance Value: 627 Ohm
Lead Channel Impedance Value: 646 Ohm
Lead Channel Impedance Value: 684 Ohm
Lead Channel Impedance Value: 722 Ohm
Lead Channel Impedance Value: 760 Ohm
Lead Channel Impedance Value: 779 Ohm
Lead Channel Impedance Value: 836 Ohm
Lead Channel Pacing Threshold Amplitude: 0.375 V
Lead Channel Pacing Threshold Amplitude: 0.75 V
Lead Channel Pacing Threshold Amplitude: 0.75 V
Lead Channel Pacing Threshold Pulse Width: 0.4 ms
Lead Channel Pacing Threshold Pulse Width: 0.4 ms
Lead Channel Pacing Threshold Pulse Width: 0.4 ms
Lead Channel Sensing Intrinsic Amplitude: 4.5 mV
Lead Channel Sensing Intrinsic Amplitude: 4.5 mV
Lead Channel Sensing Intrinsic Amplitude: 5.375 mV
Lead Channel Sensing Intrinsic Amplitude: 5.375 mV
Lead Channel Setting Pacing Amplitude: 1.5 V
Lead Channel Setting Pacing Amplitude: 1.5 V
Lead Channel Setting Pacing Amplitude: 1.75 V
Lead Channel Setting Pacing Pulse Width: 0.4 ms
Lead Channel Setting Pacing Pulse Width: 0.4 ms
Lead Channel Setting Sensing Sensitivity: 0.3 mV

## 2019-06-11 NOTE — Telephone Encounter (Signed)
Noted  

## 2019-06-11 NOTE — Telephone Encounter (Signed)
Received [facility] alert for battery 1 month remaining on ERI/optivol. Patient denies any complaints including chest pain, shortness of breath or swelling in his legs. States he feels better than he has in years. Patient is non-compliant with medications including Coreg and losartan. Patient does report of taking Eliquis. Takes inhalers prn. States he hasn't felt like he has needed them lately.   Will route to Dr. Caryl Comes for review.   BATTERY ERI- Patient informed and agreeable. Will send message to scheduler to call patient, patient advised.    ---------------------------------------------------------------------------------------------------------------------------  OPTIVOL

## 2019-07-03 ENCOUNTER — Telehealth: Payer: Self-pay

## 2019-07-03 ENCOUNTER — Telehealth (INDEPENDENT_AMBULATORY_CARE_PROVIDER_SITE_OTHER): Payer: Medicaid Other | Admitting: Internal Medicine

## 2019-07-03 ENCOUNTER — Other Ambulatory Visit: Payer: Self-pay

## 2019-07-03 VITALS — Ht 73.0 in | Wt 306.0 lb

## 2019-07-03 DIAGNOSIS — I428 Other cardiomyopathies: Secondary | ICD-10-CM | POA: Diagnosis not present

## 2019-07-03 DIAGNOSIS — I5022 Chronic systolic (congestive) heart failure: Secondary | ICD-10-CM | POA: Diagnosis not present

## 2019-07-03 DIAGNOSIS — I4819 Other persistent atrial fibrillation: Secondary | ICD-10-CM | POA: Diagnosis not present

## 2019-07-03 NOTE — Progress Notes (Signed)
Electrophysiology TeleHealth Note   Due to national recommendations of social distancing due to COVID 19, an audio/video telehealth visit is felt to be most appropriate for this patient at this time.  See MyChart message from today for the patient's consent to telehealth for [facility].   Date:  07/03/2019   ID:  Gary Kirk, DOB 1965-12-05, MRN [id number]  Location: patient's home  Provider location: [address] [place]  Evaluation Performed: Follow-up visit  PCP:  [name], FNP  Cardiologist:    TG Electrophysiologist:  SK   Chief Complaint:  ICD nearing ERI and   History of Present Illness:    Gary Kirk is a 54 y.o. male who presents via audio/video conferencing for a telehealth visit today.  Since last being seen in our clinic for NICM and hx of cardiogenic schock 2015  s/p CRT- ICD implant Medtronic [id number] which was complicated by multiple shocks 2/2 Atrial fibrillation--apparently permanent.      the patient reports doing pretty well   Less dyspnea, but still with moderate exertion.  No edema no shocks of which aware  No bleeding  DATE TEST EF   2/15 Echo 10-15%   1/21 Echo  55-60 %          Date Cr K  Hgb  9/20 1.03 3.1   3//21 0.98 4.2 15    The patient denies symptoms of fevers, chills, cough, or new SOB worrisome for COVID 19. *   Past Medical History:  Diagnosis Date  . AICD (automatic cardioverter/defibrillator) present    a. 10/2013 s/p MDT [id number] BiV ICD (ser# [id number] H).  . COPD (chronic obstructive pulmonary disease) ([place])    a. Quit smoking in 2015.  Marland Kitchen HFrEF (heart failure with reduced ejection fraction) ([place])    a. 02/2012 Echo: EF "depressed";  b. 03/2013 Echo: Ef 15%, mod to sev LV dil w/ anteroseptal AK and sev glob HK. Dilated RV w/ reduced fxn. Mild to mod RAE, Sev LAE. Mild TR; c. 11/2013 Echo: EF 10-15%, redcued RV fxn, mild BAE, mild TR.  Marland Kitchen History of ileus   . Hypertension   . Hypokalemia   .  Hypothyroidism   . Major depression   . NICM (nonischemic cardiomyopathy) ([place])    a. 02/2012 Echo: depressed EF; b. 02/2012 MV: EF 38%, small fixed apical defect, no ischemia; c. 03/2013 Echo: EF 15%; d. 08/2013 MV: apical ant, septal inf fixed defects, no ischemia; e. 10/2013 s/p MDT [id number] BiV ICD; d. 11/2013 Cath: results not avail, reportedly nl cors; e. 09/2014 MV: Fixed inf defect consistent w/ diaphragmatic atten. No ischemia. EF 28%.  . Permanent atrial fibrillation    a. CHA2DS2VASc = 2-->Eliquis.  . Sleep apnea    a. Doesn't tolerate CPAP.    Past Surgical History:  Procedure Laterality Date  . CARDIAC DEFIBRILLATOR PLACEMENT    . PACEMAKER IMPLANT      Current Outpatient Medications  Medication Sig Dispense Refill  . apixaban (ELIQUIS) 5 MG TABS tablet Take 1 tablet (5 mg total) by mouth 2 (two) times daily. 180 tablet 3  . carvedilol (COREG) 12.5 MG tablet Take 1 tablet (12.5 mg total) by mouth 2 (two) times daily with a meal. 60 tablet 6  . gabapentin (NEURONTIN) 300 MG capsule Take 300 mg by mouth 3 (three) times daily.     Marland Kitchen levothyroxine (SYNTHROID) 112 MCG tablet Take 1 tablet (112 mcg total) by mouth daily before breakfast. 30  tablet 1  . losartan (COZAAR) 25 MG tablet Take 1 tablet (25 mg total) by mouth daily. 90 tablet 3  . PROAIR HFA 108 (90 Base) MCG/ACT inhaler Inhale 2 puffs into the lungs every 4 (four) hours as needed for wheezing or shortness of breath.   3  . SPIRIVA HANDIHALER 18 MCG inhalation capsule 1 capsule daily.    . SYMBICORT 160-4.5 MCG/ACT inhaler Inhale 2 puffs into the lungs 2 (two) times daily.     No current facility-administered medications for this visit.    Allergies:   Aripiprazole, Tramadol, and Ibuprofen   Social History:  The patient  reports that he quit smoking about 5 years ago. His smoking use included cigarettes. He has never used smokeless tobacco. He reports current drug use. Drug: Marijuana. He reports that he does not drink  alcohol.   Family History:  The patient's   family history includes Atrial fibrillation in his brother and mother; COPD in his mother; Cancer in his father; Heart failure in his mother; Hypertension in his mother; Lung cancer in his brother.   ROS:  Please see the history of present illness.   All other systems are personally reviewed and negative.    Exam:    Vital Signs:  Ht 6\' 1"  (1.854 m)   Wt (!) 306 lb (138.8 kg)   BMI 40.37 kg/m         Labs/Other Tests and Data Reviewed:    Recent Labs: 05/23/2019: BUN 10; Creatinine, Ser 0.98; Hemoglobin 15.0; Platelets 187; Potassium 4.2; Sodium 138   Wt Readings from Last 3 Encounters:  07/03/19 (!) 306 lb (138.8 kg)  05/23/19 (!) 306 lb (138.8 kg)  02/27/19 (!) 308 lb (139.7 kg)     Other studies personally reviewed: Additional studies/ records that were reviewed today include:  As above        Last device remote is reviewed from [facility] dated 4/21 which reveals normal device function,   arrhythmias - none   Battery approaching ERI   ASSESSMENT & PLAN:   NICM--interval normalization  CHF chronic systolic 3  ICD - CRT  Medtronic  Afib permanent  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  Discussed device replacement--high voltage vs low voltage-- he is frightened of shocks   Also noted that notwithstanding normalization of LV function we would anticipate residual scarring and ongoing increased risk of SCD-- his device has a DF4 lead so we would need to replace with HV device and then choose either to inactivate HV therapies, or we also discussed programming VF at 250 bpm up from 214 to reduce risk of inappropriate shocks  We could also review again AV ablation  Euvolemic continue current meds  On Anticoagulation;  No bleeding issues      COVID 19 screen The patient denies symptoms of COVID 19 at this time.  The importance of social distancing was discussed today.  Follow-up: 3 month post gen change  Current medicines are reviewed  at length with the patient today.   The patient does not have concerns regarding his medicines.  The following changes were made today:  none  Labs/ tests ordered today include: gen change  -- can probably schedule in June No orders of the defined types were placed in this encounter.   Future tests ( post COVID )    Patient Risk:  after full review of this patients clinical status, I feel that they are at moderate risk at this time.  Today, I have spent 13  minutes with the patient with telehealth technology discussing the above.  Signed, Virl Axe, MD  07/03/2019 4:13 PM     [place] [address] [phone] (office) [phone] (fax)

## 2019-07-03 NOTE — Telephone Encounter (Signed)
  Patient Consent for Virtual Visit         Gary Kirk has provided verbal consent on 07/03/2019 for a virtual visit (video or telephone).   CONSENT FOR VIRTUAL VISIT FOR:  Gary Kirk  By participating in this virtual visit I agree to the following:  I hereby voluntarily request, consent and authorize [facility] and its employed or contracted physicians, physician assistants, nurse practitioners or other licensed health care professionals (the Practitioner), to provide me with telemedicine health care services (the "Services") as deemed necessary by the treating Practitioner. I acknowledge and consent to receive the Services by the Practitioner via telemedicine. I understand that the telemedicine visit will involve communicating with the Practitioner through live audiovisual communication technology and the disclosure of certain medical information by electronic transmission. I acknowledge that I have been given the opportunity to request an in-person assessment or other available alternative prior to the telemedicine visit and am voluntarily participating in the telemedicine visit.  I understand that I have the right to withhold or withdraw my consent to the use of telemedicine in the course of my care at any time, without affecting my right to future care or treatment, and that the Practitioner or I may terminate the telemedicine visit at any time. I understand that I have the right to inspect all information obtained and/or recorded in the course of the telemedicine visit and may receive copies of available information for a reasonable fee.  I understand that some of the potential risks of receiving the Services via telemedicine include:  Marland Kitchen Delay or interruption in medical evaluation due to technological equipment failure or disruption; . Information transmitted may not be sufficient (e.g. poor resolution of images) to allow for appropriate medical decision making by the  Practitioner; and/or  . In rare instances, security protocols could fail, causing a breach of personal health information.  Furthermore, I acknowledge that it is my responsibility to provide information about my medical history, conditions and care that is complete and accurate to the best of my ability. I acknowledge that Practitioner's advice, recommendations, and/or decision may be based on factors not within their control, such as incomplete or inaccurate data provided by me or distortions of diagnostic images or specimens that may result from electronic transmissions. I understand that the practice of medicine is not an exact science and that Practitioner makes no warranties or guarantees regarding treatment outcomes. I acknowledge that a copy of this consent can be made available to me via my patient portal ([place]), or I can request a printed copy by calling the office of [facility].    I understand that my insurance will be billed for this visit.   I have read or had this consent read to me. . I understand the contents of this consent, which adequately explains the benefits and risks of the Services being provided via telemedicine.  . I have been provided ample opportunity to ask questions regarding this consent and the Services and have had my questions answered to my satisfaction. . I give my informed consent for the services to be provided through the use of telemedicine in my medical care

## 2019-07-03 NOTE — H&P (View-Only) (Signed)
Electrophysiology TeleHealth Note   Due to national recommendations of social distancing due to COVID 19, an audio/video telehealth visit is felt to be most appropriate for this patient at this time.  See MyChart message from today for the patient's consent to telehealth for [facility].   Date:  07/03/2019   ID:  [name], DOB 1965/08/04, MRN [id number]  Location: patient's home  Provider location: [address] [place]  Evaluation Performed: Follow-up visit  PCP:  [name], FNP  Cardiologist:    TG Electrophysiologist:  SK   Chief Complaint:  ICD nearing ERI and   History of Present Illness:    Gary Kirk is a 54 y.o. male who presents via audio/video conferencing for a telehealth visit today.  Since last being seen in our clinic for NICM and hx of cardiogenic schock 2015  s/p CRT- ICD implant Medtronic [id number] which was complicated by multiple shocks 2/2 Atrial fibrillation--apparently permanent.      the patient reports doing pretty well   Less dyspnea, but still with moderate exertion.  No edema no shocks of which aware  No bleeding  DATE TEST EF   2/15 Echo 10-15%   1/21 Echo  55-60 %          Date Cr K  Hgb  9/20 1.03 3.1   3//21 0.98 4.2 15    The patient denies symptoms of fevers, chills, cough, or new SOB worrisome for COVID 19. *   Past Medical History:  Diagnosis Date  . AICD (automatic cardioverter/defibrillator) present    a. 10/2013 s/p MDT [id number] BiV ICD (ser# [id number]).  . COPD (chronic obstructive pulmonary disease) ([place])    a. Quit smoking in 2015.  Marland Kitchen HFrEF (heart failure with reduced ejection fraction) ([place])    a. 02/2012 Echo: EF "depressed";  b. 03/2013 Echo: Ef 15%, mod to sev LV dil w/ anteroseptal AK and sev glob HK. Dilated RV w/ reduced fxn. Mild to mod RAE, Sev LAE. Mild TR; c. 11/2013 Echo: EF 10-15%, redcued RV fxn, mild BAE, mild TR.  Marland Kitchen History of ileus   . Hypertension   . Hypokalemia   .  Hypothyroidism   . Major depression   . NICM (nonischemic cardiomyopathy) ([place])    a. 02/2012 Echo: depressed EF; b. 02/2012 MV: EF 38%, small fixed apical defect, no ischemia; c. 03/2013 Echo: EF 15%; d. 08/2013 MV: apical ant, septal inf fixed defects, no ischemia; e. 10/2013 s/p MDT [id number] BiV ICD; d. 11/2013 Cath: results not avail, reportedly nl cors; e. 09/2014 MV: Fixed inf defect consistent w/ diaphragmatic atten. No ischemia. EF 28%.  . Permanent atrial fibrillation    a. CHA2DS2VASc = 2-->Eliquis.  . Sleep apnea    a. Doesn't tolerate CPAP.    Past Surgical History:  Procedure Laterality Date  . CARDIAC DEFIBRILLATOR PLACEMENT    . PACEMAKER IMPLANT      Current Outpatient Medications  Medication Sig Dispense Refill  . apixaban (ELIQUIS) 5 MG TABS tablet Take 1 tablet (5 mg total) by mouth 2 (two) times daily. 180 tablet 3  . carvedilol (COREG) 12.5 MG tablet Take 1 tablet (12.5 mg total) by mouth 2 (two) times daily with a meal. 60 tablet 6  . gabapentin (NEURONTIN) 300 MG capsule Take 300 mg by mouth 3 (three) times daily.     Marland Kitchen levothyroxine (SYNTHROID) 112 MCG tablet Take 1 tablet (112 mcg total) by mouth daily before breakfast. 30  tablet 1  . losartan (COZAAR) 25 MG tablet Take 1 tablet (25 mg total) by mouth daily. 90 tablet 3  . PROAIR HFA 108 (90 Base) MCG/ACT inhaler Inhale 2 puffs into the lungs every 4 (four) hours as needed for wheezing or shortness of breath.   3  . SPIRIVA HANDIHALER 18 MCG inhalation capsule 1 capsule daily.    . SYMBICORT 160-4.5 MCG/ACT inhaler Inhale 2 puffs into the lungs 2 (two) times daily.     No current facility-administered medications for this visit.    Allergies:   Aripiprazole, Tramadol, and Ibuprofen   Social History:  The patient  reports that he quit smoking about 5 years ago. His smoking use included cigarettes. He has never used smokeless tobacco. He reports current drug use. Drug: Marijuana. He reports that he does not drink  alcohol.   Family History:  The patient's   family history includes Atrial fibrillation in his brother and mother; COPD in his mother; Cancer in his father; Heart failure in his mother; Hypertension in his mother; Lung cancer in his brother.   ROS:  Please see the history of present illness.   All other systems are personally reviewed and negative.    Exam:    Vital Signs:  Ht 6\' 1"  (1.854 m)   Wt (!) 306 lb (138.8 kg)   BMI 40.37 kg/m         Labs/Other Tests and Data Reviewed:    Recent Labs: 05/23/2019: BUN 10; Creatinine, Ser 0.98; Hemoglobin 15.0; Platelets 187; Potassium 4.2; Sodium 138   Wt Readings from Last 3 Encounters:  07/03/19 (!) 306 lb (138.8 kg)  05/23/19 (!) 306 lb (138.8 kg)  02/27/19 (!) 308 lb (139.7 kg)     Other studies personally reviewed: Additional studies/ records that were reviewed today include:  As above        Last device remote is reviewed from [place] PDF dated 4/21 which reveals normal device function,   arrhythmias - none   Battery approaching ERI   ASSESSMENT & PLAN:   NICM--interval normalization  CHF chronic systolic 3  ICD - CRT  Medtronic  Afib permanent  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  Discussed device replacement--high voltage vs low voltage-- he is frightened of shocks   Also noted that notwithstanding normalization of LV function we would anticipate residual scarring and ongoing increased risk of SCD-- his device has a DF4 lead so we would need to replace with HV device and then choose either to inactivate HV therapies, or we also discussed programming VF at 250 bpm up from 214 to reduce risk of inappropriate shocks  We could also review again AV ablation  Euvolemic continue current meds  On Anticoagulation;  No bleeding issues      COVID 19 screen The patient denies symptoms of COVID 19 at this time.  The importance of social distancing was discussed today.  Follow-up: 3 month post gen change  Current medicines are reviewed  at length with the patient today.   The patient does not have concerns regarding his medicines.  The following changes were made today:  none  Labs/ tests ordered today include: gen change  -- can probably schedule in June No orders of the defined types were placed in this encounter.   Future tests ( post COVID )    Patient Risk:  after full review of this patients clinical status, I feel that they are at moderate risk at this time.  Today, I have spent 13  minutes with the patient with telehealth technology discussing the above.  Signed, Virl Axe, MD  07/03/2019 4:13 PM     [address] [place] [address] [phone] (office) [phone] (fax)

## 2019-07-08 DIAGNOSIS — Z01812 Encounter for preprocedural laboratory examination: Secondary | ICD-10-CM

## 2019-07-08 NOTE — Patient Instructions (Signed)
Medication Instructions:  Your physician recommends that you continue on your current medications as directed. Please refer to the Current Medication list given to you today.  Labwork: None ordered.  Testing/Procedures: CRT-D Medtronic Generator change to be scheduled  Follow-Up: You will be contacted by Dr Olin Pia scheduler for post generator change follow up.  Any Other Special Instructions Will Be Listed Below (If Applicable).  If you need a refill on your cardiac medications before your next appointment, please call your pharmacy.

## 2019-07-10 ENCOUNTER — Ambulatory Visit (INDEPENDENT_AMBULATORY_CARE_PROVIDER_SITE_OTHER): Payer: Medicaid Other | Admitting: *Deleted

## 2019-07-10 DIAGNOSIS — I428 Other cardiomyopathies: Secondary | ICD-10-CM | POA: Diagnosis not present

## 2019-07-10 LAB — CUP PACEART REMOTE DEVICE CHECK
Battery Remaining Longevity: 1 mo
Battery Voltage: 2.74 V
Brady Statistic AP VP Percent: 60.75 %
Brady Statistic AP VS Percent: 0.05 %
Brady Statistic AS VP Percent: 38.79 %
Brady Statistic AS VS Percent: 0.41 %
Brady Statistic RA Percent Paced: 60.27 %
Brady Statistic RV Percent Paced: 98.4 %
Date Time Interrogation Session: 20210513044223
HighPow Impedance: 80 Ohm
Implantable Lead Implant Date: 20150904
Implantable Lead Implant Date: 20150904
Implantable Lead Implant Date: 20150904
Implantable Lead Location: 753858
Implantable Lead Location: 753859
Implantable Lead Location: 753860
Implantable Lead Model: 4298
Implantable Lead Model: 5076
Implantable Lead Model: 6935
Implantable Pulse Generator Implant Date: 20150904
Lead Channel Impedance Value: 342 Ohm
Lead Channel Impedance Value: 437 Ohm
Lead Channel Impedance Value: 437 Ohm
Lead Channel Impedance Value: 475 Ohm
Lead Channel Impedance Value: 551 Ohm
Lead Channel Impedance Value: 570 Ohm
Lead Channel Impedance Value: 589 Ohm
Lead Channel Impedance Value: 684 Ohm
Lead Channel Impedance Value: 684 Ohm
Lead Channel Impedance Value: 684 Ohm
Lead Channel Impedance Value: 722 Ohm
Lead Channel Impedance Value: 760 Ohm
Lead Channel Impedance Value: 798 Ohm
Lead Channel Pacing Threshold Amplitude: 0.5 V
Lead Channel Pacing Threshold Amplitude: 0.75 V
Lead Channel Pacing Threshold Amplitude: 0.875 V
Lead Channel Pacing Threshold Pulse Width: 0.4 ms
Lead Channel Pacing Threshold Pulse Width: 0.4 ms
Lead Channel Pacing Threshold Pulse Width: 0.4 ms
Lead Channel Sensing Intrinsic Amplitude: 4.875 mV
Lead Channel Sensing Intrinsic Amplitude: 4.875 mV
Lead Channel Sensing Intrinsic Amplitude: 5.375 mV
Lead Channel Sensing Intrinsic Amplitude: 5.375 mV
Lead Channel Setting Pacing Amplitude: 1.5 V
Lead Channel Setting Pacing Amplitude: 1.75 V
Lead Channel Setting Pacing Amplitude: 1.75 V
Lead Channel Setting Pacing Pulse Width: 0.4 ms
Lead Channel Setting Pacing Pulse Width: 0.4 ms
Lead Channel Setting Sensing Sensitivity: 0.3 mV

## 2019-07-14 ENCOUNTER — Telehealth: Payer: Self-pay

## 2019-07-14 NOTE — Progress Notes (Signed)
Remote ICD transmission.   

## 2019-07-14 NOTE — Telephone Encounter (Signed)
Spoke with pt and reviewed device generator change instruction letter. (See Communication)  Pt verbalizes understanding and agrees with current plan.

## 2019-07-17 ENCOUNTER — Other Ambulatory Visit: Payer: Medicaid Other

## 2019-07-17 ENCOUNTER — Other Ambulatory Visit: Payer: Self-pay

## 2019-07-17 ENCOUNTER — Other Ambulatory Visit (HOSPITAL_COMMUNITY)
Admission: RE | Admit: 2019-07-17 | Discharge: 2019-07-17 | Disposition: A | Discharge status: Home / Self Care | Payer: Medicaid Other | Source: Ambulatory Visit | Attending: Internal Medicine | Admitting: Internal Medicine

## 2019-07-17 DIAGNOSIS — Z01812 Encounter for preprocedural laboratory examination: Secondary | ICD-10-CM

## 2019-07-17 DIAGNOSIS — Z20822 Contact with and (suspected) exposure to covid-19: Secondary | ICD-10-CM | POA: Insufficient documentation

## 2019-07-18 LAB — CBC
Hematocrit: 41.4 % (ref 37.5–51.0)
Hemoglobin: 14.3 g/dL (ref 13.0–17.7)
MCH: 31.5 pg (ref 26.6–33.0)
MCHC: 34.5 g/dL (ref 31.5–35.7)
MCV: 91 fL (ref 79–97)
Platelets: 176 10*3/uL (ref 150–450)
RBC: 4.54 x10E6/uL (ref 4.14–5.80)
RDW: 12.8 % (ref 11.6–15.4)
WBC: 9.1 10*3/uL (ref 3.4–10.8)

## 2019-07-18 LAB — BASIC METABOLIC PANEL
BUN/Creatinine Ratio: 9 (ref 9–20)
BUN: 10 mg/dL (ref 6–24)
CO2: 24 mmol/L (ref 20–29)
Calcium: 9.2 mg/dL (ref 8.7–10.2)
Chloride: 100 mmol/L (ref 96–106)
Creatinine, Ser: 1.1 mg/dL (ref 0.76–1.27)
GFR calc Af Amer: 88 mL/min/{1.73_m2} (ref >59)
GFR calc non Af Amer: 76 mL/min/{1.73_m2} (ref >59)
Glucose: 97 mg/dL (ref 65–99)
Potassium: 3.7 mmol/L (ref 3.5–5.2)
Sodium: 139 mmol/L (ref 134–144)

## 2019-07-18 LAB — SARS CORONAVIRUS 2 (TAT 6-24 HRS): SARS Coronavirus 2: NEGATIVE

## 2019-07-21 ENCOUNTER — Other Ambulatory Visit: Payer: Self-pay

## 2019-07-21 ENCOUNTER — Ambulatory Visit (HOSPITAL_COMMUNITY)
Admission: RE | Disposition: A | Discharge status: Home / Self Care | Payer: Self-pay | Source: Home / Self Care | Attending: Internal Medicine

## 2019-07-21 ENCOUNTER — Ambulatory Visit (HOSPITAL_COMMUNITY)
Admission: RE | Admit: 2019-07-21 | Discharge: 2019-07-21 | Disposition: A | Discharge status: Home / Self Care | Payer: Medicaid Other | Attending: Internal Medicine | Admitting: Internal Medicine

## 2019-07-21 DIAGNOSIS — I428 Other cardiomyopathies: Secondary | ICD-10-CM | POA: Diagnosis not present

## 2019-07-21 DIAGNOSIS — E039 Hypothyroidism, unspecified: Secondary | ICD-10-CM | POA: Insufficient documentation

## 2019-07-21 DIAGNOSIS — Z7951 Long term (current) use of inhaled steroids: Secondary | ICD-10-CM | POA: Diagnosis not present

## 2019-07-21 DIAGNOSIS — Z4502 Encounter for adjustment and management of automatic implantable cardiac defibrillator: Secondary | ICD-10-CM | POA: Insufficient documentation

## 2019-07-21 DIAGNOSIS — I110 Hypertensive heart disease with heart failure: Secondary | ICD-10-CM | POA: Insufficient documentation

## 2019-07-21 DIAGNOSIS — Z006 Encounter for examination for normal comparison and control in clinical research program: Secondary | ICD-10-CM | POA: Diagnosis not present

## 2019-07-21 DIAGNOSIS — I5022 Chronic systolic (congestive) heart failure: Secondary | ICD-10-CM | POA: Diagnosis not present

## 2019-07-21 DIAGNOSIS — Z79899 Other long term (current) drug therapy: Secondary | ICD-10-CM | POA: Diagnosis not present

## 2019-07-21 DIAGNOSIS — Z79890 Hormone replacement therapy (postmenopausal): Secondary | ICD-10-CM | POA: Insufficient documentation

## 2019-07-21 DIAGNOSIS — J449 Chronic obstructive pulmonary disease, unspecified: Secondary | ICD-10-CM | POA: Insufficient documentation

## 2019-07-21 DIAGNOSIS — Z885 Allergy status to narcotic agent status: Secondary | ICD-10-CM | POA: Diagnosis not present

## 2019-07-21 DIAGNOSIS — Z87891 Personal history of nicotine dependence: Secondary | ICD-10-CM | POA: Insufficient documentation

## 2019-07-21 DIAGNOSIS — G4730 Sleep apnea, unspecified: Secondary | ICD-10-CM | POA: Diagnosis not present

## 2019-07-21 DIAGNOSIS — Z886 Allergy status to analgesic agent status: Secondary | ICD-10-CM | POA: Insufficient documentation

## 2019-07-21 DIAGNOSIS — Z7901 Long term (current) use of anticoagulants: Secondary | ICD-10-CM | POA: Diagnosis not present

## 2019-07-21 DIAGNOSIS — Z8619 Personal history of other infectious and parasitic diseases: Secondary | ICD-10-CM | POA: Insufficient documentation

## 2019-07-21 DIAGNOSIS — Z8249 Family history of ischemic heart disease and other diseases of the circulatory system: Secondary | ICD-10-CM | POA: Diagnosis not present

## 2019-07-21 DIAGNOSIS — I4821 Permanent atrial fibrillation: Secondary | ICD-10-CM | POA: Insufficient documentation

## 2019-07-21 HISTORY — PX: BIV ICD GENERATOR CHANGEOUT: EP1194

## 2019-07-21 SURGERY — BIV ICD GENERATOR CHANGEOUT

## 2019-07-21 MED ORDER — CEFAZOLIN SODIUM-DEXTROSE 2-4 GM/100ML-% IV SOLN
INTRAVENOUS | Status: AC
  Filled 2019-07-21: qty 100

## 2019-07-21 MED ORDER — LIDOCAINE HCL 1 % IJ SOLN
INTRAMUSCULAR | Status: AC
  Filled 2019-07-21: qty 60

## 2019-07-21 MED ORDER — MIDAZOLAM HCL 5 MG/5ML IJ SOLN
INTRAMUSCULAR | Status: AC
  Filled 2019-07-21: qty 5

## 2019-07-21 MED ORDER — SODIUM CHLORIDE 0.9 % IV SOLN
80.0000 mg | INTRAVENOUS | Status: DC
  Filled 2019-07-21: qty 2

## 2019-07-21 MED ORDER — MIDAZOLAM HCL 5 MG/5ML IJ SOLN
INTRAMUSCULAR | Status: DC | PRN
  Administered 2019-07-21: 3 mg via INTRAVENOUS

## 2019-07-21 MED ORDER — FENTANYL CITRATE (PF) 100 MCG/2ML IJ SOLN
INTRAMUSCULAR | Status: AC
  Filled 2019-07-21: qty 2

## 2019-07-21 MED ORDER — ACETAMINOPHEN 325 MG PO TABS: 325.0000 mg | ORAL_TABLET | ORAL | Status: DC | PRN

## 2019-07-21 MED ORDER — FENTANYL CITRATE (PF) 100 MCG/2ML IJ SOLN
INTRAMUSCULAR | Status: DC | PRN
  Administered 2019-07-21: 50 ug via INTRAVENOUS

## 2019-07-21 MED ORDER — LIDOCAINE HCL (PF) 1 % IJ SOLN
INTRAMUSCULAR | Status: DC | PRN
  Administered 2019-07-21: 60 mL

## 2019-07-21 MED ORDER — SODIUM CHLORIDE 0.9 % IV SOLN: INTRAVENOUS | Status: DC

## 2019-07-21 MED ORDER — SODIUM CHLORIDE 0.9 % IV SOLN
Freq: Once | INTRAVENOUS | Status: AC
  Filled 2019-07-21: qty 2

## 2019-07-21 MED ORDER — SODIUM CHLORIDE 0.9 % IV SOLN
INTRAVENOUS | Status: AC
  Filled 2019-07-21: qty 2

## 2019-07-21 MED ORDER — CHLORHEXIDINE GLUCONATE 4 % EX LIQD
4.0000 "application " | Freq: Once | CUTANEOUS | Status: DC
  Filled 2019-07-21: qty 60

## 2019-07-21 MED ORDER — DEXTROSE 5 % IV SOLN
3.0000 g | INTRAVENOUS | Status: AC
  Administered 2019-07-21: 3 g via INTRAVENOUS
  Filled 2019-07-21: qty 3000

## 2019-07-21 SURGICAL SUPPLY — 5 items
CABLE SURGICAL S-101-97-12 (CABLE) ×1 IMPLANT
HEMOSTAT SURGICEL 2X4 FIBR (HEMOSTASIS) ×1 IMPLANT
ICD CLARIA MRI DTMA1QQ (ICD Generator) ×1 IMPLANT
PAD PRO RADIOLUCENT 2001M-C (PAD) ×1 IMPLANT
TRAY PACEMAKER INSERTION (PACKS) ×1 IMPLANT

## 2019-07-21 NOTE — Progress Notes (Signed)
Per Dr Caryl Comes may d/c at 1430

## 2019-07-21 NOTE — Interval H&P Note (Signed)
History and Physical Interval Note:  07/21/2019 10:43 AM  Gary Kirk  has presented today for surgery, with the diagnosis of ERI.  The various methods of treatment have been discussed with the patient and family. After consideration of risks, benefits and other options for treatment, the patient has consented to  Procedure(s): BIV ICD [place] (N/A) as a surgical intervention.  The patient's history has been reviewed, patient examined, no change in status, stable for surgery.  I have reviewed the patient's chart and labs.  Questions were answered to the patient's satisfaction.     Virl Axe  BP (!) 152/86   Pulse 70   Temp 97.6 F (36.4 C)   Resp 17   Ht 6\' 1"  (1.854 m)   Wt (!) 139.3 kg   SpO2 99%   BMI 40.50 kg/m  Well developed and nourished in no acute distress HENT normal Neck supple   Clear Regular rate and rhythm, no murmurs or gallops Abd-soft with active BS No Clubbing cyanosis edema Skin-warm and dry A & Oriented  Grossly normal sensory and motor function

## 2019-07-21 NOTE — Discharge Instructions (Signed)

## 2019-07-22 MED FILL — Lidocaine HCl Local Inj 1%: INTRAMUSCULAR | Qty: 60 | Status: AC

## 2019-07-22 MED FILL — Cefazolin Sodium-Dextrose IV Solution 2 GM/100ML-4%: INTRAVENOUS | Qty: 200 | Status: AC

## 2019-07-31 ENCOUNTER — Ambulatory Visit: Payer: Medicaid Other

## 2019-08-05 ENCOUNTER — Ambulatory Visit: Payer: Medicaid Other

## 2019-08-07 ENCOUNTER — Other Ambulatory Visit: Payer: Self-pay

## 2019-08-07 ENCOUNTER — Ambulatory Visit (INDEPENDENT_AMBULATORY_CARE_PROVIDER_SITE_OTHER): Payer: Medicaid Other | Admitting: Emergency Medicine

## 2019-08-07 DIAGNOSIS — I5022 Chronic systolic (congestive) heart failure: Secondary | ICD-10-CM | POA: Diagnosis not present

## 2019-08-07 DIAGNOSIS — I428 Other cardiomyopathies: Secondary | ICD-10-CM | POA: Diagnosis not present

## 2019-08-07 DIAGNOSIS — Z95810 Presence of automatic (implantable) cardiac defibrillator: Secondary | ICD-10-CM | POA: Diagnosis not present

## 2019-08-08 LAB — CUP PACEART INCLINIC DEVICE CHECK
Battery Remaining Longevity: 104 mo
Battery Voltage: 3.14 V
Brady Statistic AP VP Percent: 63.52 %
Brady Statistic AP VS Percent: 0.04 %
Brady Statistic AS VP Percent: 36.19 %
Brady Statistic AS VS Percent: 0.24 %
Brady Statistic RA Percent Paced: 63.34 %
Brady Statistic RV Percent Paced: 99.27 %
Date Time Interrogation Session: 20210610163600
HighPow Impedance: 73 Ohm
Implantable Lead Implant Date: 20150904
Implantable Lead Implant Date: 20150904
Implantable Lead Implant Date: 20150904
Implantable Lead Location: 753858
Implantable Lead Location: 753859
Implantable Lead Location: 753860
Implantable Lead Model: 4298
Implantable Lead Model: 5076
Implantable Lead Model: 6935
Implantable Pulse Generator Implant Date: 20210524
Lead Channel Impedance Value: 224.438
Lead Channel Impedance Value: 228 Ohm
Lead Channel Impedance Value: 249.375
Lead Channel Impedance Value: 289.597
Lead Channel Impedance Value: 295.556
Lead Channel Impedance Value: 399 Ohm
Lead Channel Impedance Value: 513 Ohm
Lead Channel Impedance Value: 532 Ohm
Lead Channel Impedance Value: 551 Ohm
Lead Channel Impedance Value: 589 Ohm
Lead Channel Impedance Value: 665 Ohm
Lead Channel Impedance Value: 665 Ohm
Lead Channel Impedance Value: 760 Ohm
Lead Channel Impedance Value: 817 Ohm
Lead Channel Impedance Value: 836 Ohm
Lead Channel Impedance Value: 836 Ohm
Lead Channel Impedance Value: 893 Ohm
Lead Channel Impedance Value: 988 Ohm
Lead Channel Pacing Threshold Amplitude: 0.5 V
Lead Channel Pacing Threshold Amplitude: 0.75 V
Lead Channel Pacing Threshold Amplitude: 0.75 V
Lead Channel Pacing Threshold Pulse Width: 0.4 ms
Lead Channel Pacing Threshold Pulse Width: 0.4 ms
Lead Channel Pacing Threshold Pulse Width: 0.4 ms
Lead Channel Sensing Intrinsic Amplitude: 5.625 mV
Lead Channel Sensing Intrinsic Amplitude: 7.25 mV
Lead Channel Setting Pacing Amplitude: 1.5 V
Lead Channel Setting Pacing Amplitude: 1.75 V
Lead Channel Setting Pacing Amplitude: 1.75 V
Lead Channel Setting Pacing Pulse Width: 0.4 ms
Lead Channel Setting Pacing Pulse Width: 0.4 ms
Lead Channel Setting Sensing Sensitivity: 0.3 mV

## 2019-08-08 NOTE — Progress Notes (Signed)
Wound check appointment. Dermabond removed. Wound without redness or edema. Incision edges approximated, wound well healed. Normal device function. Thresholds, sensing, and impedances consistent with implant measurements. Device programmed at chronic outputs s/p generator replacement. BiVP 99.3%. Histogram distribution appropriate for patient and level of activity. No mode switches or ventricular arrhythmias noted. Patient educated about wound care, arm mobility, lifting restrictions, shock plan, and Carelink monitor. New monitor ordered per patient request as his was lost in a recent move. Carelink and ROV with Dr. Caryl Comes in [place] on 10/21/19.

## 2019-10-21 ENCOUNTER — Encounter: Payer: Medicaid Other | Admitting: Internal Medicine

## 2019-10-23 ENCOUNTER — Other Ambulatory Visit: Payer: Self-pay

## 2019-10-23 ENCOUNTER — Encounter: Payer: Self-pay | Admitting: Emergency Medicine

## 2019-10-23 ENCOUNTER — Emergency Department
Admission: EM | Admit: 2019-10-23 | Discharge: 2019-10-25 | Disposition: A | Discharge status: Home / Self Care | Payer: Medicaid Other | Attending: Student in an Organized Health Care Education/Training Program | Admitting: Student in an Organized Health Care Education/Training Program

## 2019-10-23 ENCOUNTER — Emergency Department: Payer: Medicaid Other

## 2019-10-23 DIAGNOSIS — U071 COVID-19: Secondary | ICD-10-CM | POA: Diagnosis not present

## 2019-10-23 DIAGNOSIS — F319 Bipolar disorder, unspecified: Secondary | ICD-10-CM | POA: Diagnosis not present

## 2019-10-23 DIAGNOSIS — R45851 Suicidal ideations: Secondary | ICD-10-CM | POA: Insufficient documentation

## 2019-10-23 DIAGNOSIS — Z7951 Long term (current) use of inhaled steroids: Secondary | ICD-10-CM | POA: Insufficient documentation

## 2019-10-23 DIAGNOSIS — F4321 Adjustment disorder with depressed mood: Secondary | ICD-10-CM | POA: Diagnosis not present

## 2019-10-23 DIAGNOSIS — E039 Hypothyroidism, unspecified: Secondary | ICD-10-CM | POA: Diagnosis not present

## 2019-10-23 DIAGNOSIS — I5043 Acute on chronic combined systolic (congestive) and diastolic (congestive) heart failure: Secondary | ICD-10-CM | POA: Diagnosis not present

## 2019-10-23 DIAGNOSIS — J449 Chronic obstructive pulmonary disease, unspecified: Secondary | ICD-10-CM | POA: Diagnosis not present

## 2019-10-23 DIAGNOSIS — Z87891 Personal history of nicotine dependence: Secondary | ICD-10-CM | POA: Insufficient documentation

## 2019-10-23 DIAGNOSIS — N182 Chronic kidney disease, stage 2 (mild): Secondary | ICD-10-CM | POA: Diagnosis not present

## 2019-10-23 DIAGNOSIS — I130 Hypertensive heart and chronic kidney disease with heart failure and stage 1 through stage 4 chronic kidney disease, or unspecified chronic kidney disease: Secondary | ICD-10-CM | POA: Insufficient documentation

## 2019-10-23 DIAGNOSIS — Z79899 Other long term (current) drug therapy: Secondary | ICD-10-CM | POA: Diagnosis not present

## 2019-10-23 DIAGNOSIS — F332 Major depressive disorder, recurrent severe without psychotic features: Secondary | ICD-10-CM | POA: Diagnosis present

## 2019-10-23 LAB — BASIC METABOLIC PANEL
Anion gap: 17 — ABNORMAL HIGH (ref 5–15)
BUN: 22 mg/dL — ABNORMAL HIGH (ref 6–20)
CO2: 23 mmol/L (ref 22–32)
Calcium: 9.6 mg/dL (ref 8.9–10.3)
Chloride: 97 mmol/L — ABNORMAL LOW (ref 98–111)
Creatinine, Ser: 1.44 mg/dL — ABNORMAL HIGH (ref 0.61–1.24)
GFR calc Af Amer: >60 mL/min (ref >60)
GFR calc non Af Amer: 55 mL/min — ABNORMAL LOW (ref >60)
Glucose, Bld: 151 mg/dL — ABNORMAL HIGH (ref 70–99)
Potassium: 3.1 mmol/L — ABNORMAL LOW (ref 3.5–5.1)
Sodium: 137 mmol/L (ref 135–145)

## 2019-10-23 LAB — URINALYSIS, COMPLETE (UACMP) WITH MICROSCOPIC
Bacteria, UA: NONE SEEN
Glucose, UA: NEGATIVE mg/dL
Ketones, ur: NEGATIVE mg/dL
Leukocytes,Ua: NEGATIVE
Nitrite: NEGATIVE
Protein, ur: 100 mg/dL — AB
Specific Gravity, Urine: 1.031 — ABNORMAL HIGH (ref 1.005–1.030)
pH: 5 (ref 5.0–8.0)

## 2019-10-23 LAB — CBC
HCT: 51 % (ref 39.0–52.0)
Hemoglobin: 18.1 g/dL — ABNORMAL HIGH (ref 13.0–17.0)
MCH: 31.7 pg (ref 26.0–34.0)
MCHC: 35.5 g/dL (ref 30.0–36.0)
MCV: 89.3 fL (ref 80.0–100.0)
Platelets: 217 10*3/uL (ref 150–400)
RBC: 5.71 MIL/uL (ref 4.22–5.81)
RDW: 12.9 % (ref 11.5–15.5)
WBC: 9 10*3/uL (ref 4.0–10.5)
nRBC: 0 % (ref 0.0–0.2)

## 2019-10-23 LAB — PROTIME-INR
INR: 1 (ref 0.8–1.2)
Prothrombin Time: 12.5 seconds (ref 11.4–15.2)

## 2019-10-23 LAB — TROPONIN I (HIGH SENSITIVITY)
Troponin I (High Sensitivity): 34 ng/L — ABNORMAL HIGH (ref <18)
Troponin I (High Sensitivity): 18 ng/L — ABNORMAL HIGH (ref <18)

## 2019-10-23 LAB — SARS CORONAVIRUS 2 BY RT PCR (HOSPITAL ORDER, PERFORMED IN ~~LOC~~ HOSPITAL LAB): SARS Coronavirus 2: POSITIVE — AB

## 2019-10-23 MED ORDER — MOMETASONE FURO-FORMOTEROL FUM 200-5 MCG/ACT IN AERO
2.0000 | INHALATION_SPRAY | Freq: Two times a day (BID) | RESPIRATORY_TRACT | Status: DC
  Administered 2019-10-23 – 2019-10-25 (×4): 2 via RESPIRATORY_TRACT
  Filled 2019-10-23: qty 8.8

## 2019-10-23 MED ORDER — ALBUTEROL SULFATE HFA 108 (90 BASE) MCG/ACT IN AERS
2.0000 | INHALATION_SPRAY | RESPIRATORY_TRACT | Status: DC | PRN
  Administered 2019-10-24: 2 via RESPIRATORY_TRACT
  Filled 2019-10-23 (×2): qty 6.7

## 2019-10-23 MED ORDER — ACETAMINOPHEN 500 MG PO TABS
1000.0000 mg | ORAL_TABLET | Freq: Four times a day (QID) | ORAL | Status: DC | PRN
  Administered 2019-10-23 – 2019-10-25 (×3): 1000 mg via ORAL
  Filled 2019-10-23 (×3): qty 2

## 2019-10-23 MED ORDER — LOSARTAN POTASSIUM 50 MG PO TABS
25.0000 mg | ORAL_TABLET | Freq: Every day | ORAL | Status: DC
  Administered 2019-10-24 – 2019-10-25 (×2): 25 mg via ORAL
  Filled 2019-10-23 (×4): qty 1

## 2019-10-23 MED ORDER — TIOTROPIUM BROMIDE MONOHYDRATE 18 MCG IN CAPS
1.0000 | ORAL_CAPSULE | Freq: Every day | RESPIRATORY_TRACT | Status: DC | PRN
  Filled 2019-10-23 (×2): qty 5

## 2019-10-23 MED ORDER — LEVOTHYROXINE SODIUM 112 MCG PO TABS
112.0000 ug | ORAL_TABLET | Freq: Every day | ORAL | Status: DC
  Administered 2019-10-24 – 2019-10-25 (×2): 112 ug via ORAL
  Filled 2019-10-23 (×2): qty 1

## 2019-10-23 MED ORDER — CARVEDILOL 6.25 MG PO TABS
12.5000 mg | ORAL_TABLET | Freq: Two times a day (BID) | ORAL | Status: DC
  Administered 2019-10-23 – 2019-10-25 (×4): 12.5 mg via ORAL
  Filled 2019-10-23: qty 2
  Filled 2019-10-23: qty 1
  Filled 2019-10-23 (×2): qty 2

## 2019-10-23 MED ORDER — SODIUM CHLORIDE 0.9 % IV BOLUS
500.0000 mL | Freq: Once | INTRAVENOUS | Status: AC
  Administered 2019-10-23: 500 mL via INTRAVENOUS

## 2019-10-23 MED ORDER — ACETAMINOPHEN 325 MG PO TABS
ORAL_TABLET | ORAL | Status: AC
  Administered 2019-10-23: 650 mg via ORAL
  Filled 2019-10-23: qty 2

## 2019-10-23 MED ORDER — OLANZAPINE 5 MG PO TABS
7.5000 mg | ORAL_TABLET | Freq: Every day | ORAL | Status: DC
  Administered 2019-10-24 – 2019-10-25 (×2): 7.5 mg via ORAL
  Filled 2019-10-23 (×2): qty 2

## 2019-10-23 MED ORDER — CARVEDILOL 12.5 MG PO TABS: 12.5000 mg | ORAL_TABLET | Freq: Two times a day (BID) | ORAL | Status: DC

## 2019-10-23 MED ORDER — DULOXETINE HCL 30 MG PO CPEP
30.0000 mg | ORAL_CAPSULE | Freq: Every day | ORAL | Status: DC
  Administered 2019-10-24 – 2019-10-25 (×3): 30 mg via ORAL
  Filled 2019-10-23 (×4): qty 1

## 2019-10-23 MED ORDER — ACETAMINOPHEN 325 MG PO TABS
650.0000 mg | ORAL_TABLET | Freq: Once | ORAL | Status: AC
  Administered 2019-10-23: 650 mg via ORAL

## 2019-10-23 NOTE — ED Notes (Signed)
Supper tray was given with juice.

## 2019-10-23 NOTE — ED Notes (Signed)
Returned from xray

## 2019-10-23 NOTE — ED Notes (Signed)
Rainbow was sent to lab. 

## 2019-10-23 NOTE — ED Notes (Signed)
Report to receiving nurse, patient transferred to [facility].

## 2019-10-23 NOTE — ED Triage Notes (Addendum)
Presents by [employer] he has not eaten or had anything to drink  States he has not had food or power at his house for over 1 week  Also states he is having S/I thoughts

## 2019-10-23 NOTE — ED Notes (Signed)
Lunch tray was given with juice.

## 2019-10-23 NOTE — ED Notes (Signed)
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

## 2019-10-23 NOTE — ED Notes (Signed)
Report given to next RN, including safety level and SI risk level.

## 2019-10-23 NOTE — ED Notes (Signed)
Charge Nurse instructed that pt be confined to room till he can assign him a bed in main ED; Andedra RN confirmed that she is looking for ED bed for pt.

## 2019-10-23 NOTE — ED Notes (Signed)
Pt is clam and cooperative, in no acute distress, pt reports SI- 8 with no plans or intent. Pt denies AVH Pain report of 7 with pain management goal of 4. See MAR for intervention. Vital WNL.

## 2019-10-23 NOTE — BH Assessment (Signed)
Assessment Note  Gary Kirk is an 54 y.o. male. Per triage note: Presents by [employer] he has not eaten or had anything to drink. States he has not had food or power at his house for over 1 week  Also states he is having S/I thoughts.  Patient was resting upon this writer's arrival. The patient had poor hygiene but presented with good eye contact. The patient was oriented x4 with logical/relevant thought processes. The patient had an unremarkable speech pattern. When asked why he'd been brought to the ED, the patient stated, "I've been living in a building for last 3 years" Patient expressed that he'd become increasingly depressed and hopeless due to homeless state. The patient presented with a sullen mood and affect. Patient reported no previous psychiatric admissions. Patient admitted to regular marijuana use stating, "I'm a pothead." The patient stated, "I want to get stabilized and back with mental health." Patient reported that he has poor sleep and a fair appetite. Denies any hallucinations, suicidal/homicidal ideations, or paranoia.   Diagnosis: 296.33 Major depressive disorder, Recurrent episode, Severe  Past Medical History:  Past Medical History:  Diagnosis Date  . AICD (automatic cardioverter/defibrillator) present    a. 10/2013 s/p MDT [id number] BiV ICD (ser# [id number]).  . COPD (chronic obstructive pulmonary disease) ([place])    a. Quit smoking in 2015.  Marland Kitchen HFrEF (heart failure with reduced ejection fraction) ([place])    a. 02/2012 Echo: EF "depressed";  b. 03/2013 Echo: Ef 15%, mod to sev LV dil w/ anteroseptal AK and sev glob HK. Dilated RV w/ reduced fxn. Mild to mod RAE, Sev LAE. Mild TR; c. 11/2013 Echo: EF 10-15%, redcued RV fxn, mild BAE, mild TR.  Marland Kitchen History of ileus   . Hypertension   . Hypokalemia   . Hypothyroidism   . Major depression   . NICM (nonischemic cardiomyopathy) ([place])    a. 02/2012 Echo: depressed EF; b. 02/2012 MV: EF 38%, small fixed apical defect, no ischemia;  c. 03/2013 Echo: EF 15%; d. 08/2013 MV: apical ant, septal inf fixed defects, no ischemia; e. 10/2013 s/p MDT [id number] BiV ICD; d. 11/2013 Cath: results not avail, reportedly nl cors; e. 09/2014 MV: Fixed inf defect consistent w/ diaphragmatic atten. No ischemia. EF 28%.  . Permanent atrial fibrillation ([facility])    a. CHA2DS2VASc = 2-->Eliquis.  . Sleep apnea    a. Doesn't tolerate CPAP.    Past Surgical History:  Procedure Laterality Date  . BIV ICD GENERATOR CHANGEOUT N/A 07/21/2019   Procedure: BIV ICD GENERATOR CHANGEOUT;  Surgeon: Deboraha Sprang, MD;  Location: [facility];  Service: Cardiovascular;  Laterality: N/A;  . CARDIAC DEFIBRILLATOR PLACEMENT    . PACEMAKER IMPLANT      Family History:  Family History  Problem Relation Age of Onset  . COPD Mother        decsd 2008  . Hypertension Mother   . Atrial fibrillation Mother   . Heart failure Mother   . Cancer Father        unknown cancer, died when patient was 52  . Lung cancer Brother   . Atrial fibrillation Brother     Social History:  reports that he quit smoking about 5 years ago. His smoking use included cigarettes. He has never used smokeless tobacco. He reports current drug use. Drug: Marijuana. He reports that he does not drink alcohol.  Additional Social History:  Alcohol / Drug Use Pain Medications: See PTA Prescriptions: See PTA History of  alcohol / drug use?: Yes Substance #1 Name of Substance 1: Marijuana  CIWA: CIWA-Ar BP: (!) 124/105 Pulse Rate: (!) 111 COWS:    Allergies:  Allergies  Allergen Reactions  . Aripiprazole Other (See Comments)    Right arm shaking   . Tramadol Shortness Of Breath and Nausea Only    Pt states he has "flu type symptoms" after receiving tramadol   . Ibuprofen Other (See Comments)    "Bleeding ulcers" per pt Spits up blood     Home Medications: (Not in a hospital admission)   OB/GYN Status:  No LMP for male patient.  General Assessment Data Location of  Assessment: [facility] ED TTS Assessment: In system Is this a Tele or Face-to-Face Assessment?: Face-to-Face Is this an Initial Assessment or a Re-assessment for this encounter?: Initial Assessment Patient Accompanied by:: N/A Language Other than English: No Living Arrangements: Homeless/Shelter What gender do you identify as?: Male Date Telepsych consult ordered in [facility]: 10/23/19 Time Telepsych consult ordered in [facility]: 1008 Marital status: Single Maiden name: n/a Pregnancy Status: No Living Arrangements: Other (Comment) (Homeless) Can pt return to current living arrangement?: Yes Admission Status: Involuntary Petitioner: Police Is patient capable of signing voluntary admission?: Yes Referral Source: Other Insurance type: [place] Medicaid  Medical Screening Exam ([place]) Medical Exam completed: Yes  Crisis Care Plan Living Arrangements: Other (Comment) (Homeless) Legal Guardian: Other: (Self) Name of Psychiatrist: None noted Name of Therapist: None noted  Education Status Is patient currently in school?: No Is the patient employed, unemployed or receiving disability?: Unemployed  Risk to self with the past 6 months Suicidal Ideation: No Has patient been a risk to self within the past 6 months prior to admission? : No Suicidal Intent: No Has patient had any suicidal intent within the past 6 months prior to admission? : No Is patient at risk for suicide?: No Suicidal Plan?: No Has patient had any suicidal plan within the past 6 months prior to admission? : No Access to Means: No What has been your use of drugs/alcohol within the last 12 months?: THC Previous Attempts/Gestures: No How many times?: 0 Other Self Harm Risks: Depression Triggers for Past Attempts: None known Intentional Self Injurious Behavior: None Family Suicide History: Unknown Recent stressful life event(s): Financial Problems, Conflict (Comment) Persecutory voices/beliefs?: No Depression:  Yes Depression Symptoms: Feeling worthless/self pity Substance abuse history and/or treatment for substance abuse?: No Suicide prevention information given to non-admitted patients: Not applicable  Risk to Others within the past 6 months Homicidal Ideation: No Does patient have any lifetime risk of violence toward others beyond the six months prior to admission? : No Thoughts of Harm to Others: No Current Homicidal Intent: No Current Homicidal Plan: No Access to Homicidal Means: No Identified Victim: n/a History of harm to others?: No Assessment of Violence: None Noted Violent Behavior Description: n/a Does patient have access to weapons?: No Criminal Charges Pending?: No Does patient have a court date: No Is patient on probation?: No  Psychosis Hallucinations: None noted Delusions: None noted  Mental Status Report Appearance/Hygiene: Poor hygiene Eye Contact: Good Motor Activity: Freedom of movement Speech: Logical/coherent Level of Consciousness: Drowsy Mood: Despair Affect: Appropriate to circumstance Anxiety Level: None Thought Processes: Coherent, Relevant Judgement: Unimpaired Orientation: Person, Place, Situation, Time Obsessive Compulsive Thoughts/Behaviors: None  Cognitive Functioning Concentration: Normal Memory: Recent Intact, Remote Intact Is patient IDD: No Insight: Fair Impulse Control: Good Appetite: Fair Have you had any weight changes? : No Change Sleep: No Change Vegetative  Symptoms: None  ADLScreening [facility] Assessment Services) Patient's cognitive ability adequate to safely complete daily activities?: Yes Patient able to express need for assistance with ADLs?: Yes Independently performs ADLs?: Yes (appropriate for developmental age)  Prior Inpatient Therapy Prior Inpatient Therapy: No  Prior Outpatient Therapy Prior Outpatient Therapy: No Does patient have an ACCT team?: No Does patient have Intensive In-House Services?  : No Does  patient have [facility] services? : No Does patient have P4CC services?: No  ADL Screening (condition at time of admission) Patient's cognitive ability adequate to safely complete daily activities?: Yes Is the patient deaf or have difficulty hearing?: No Does the patient have difficulty concentrating, remembering, or making decisions?: No Patient able to express need for assistance with ADLs?: Yes Does the patient have difficulty dressing or bathing?: No Independently performs ADLs?: Yes (appropriate for developmental age) Does the patient have difficulty walking or climbing stairs?: No Weakness of Legs: None Weakness of Arms/Hands: None  Home Assistive Devices/Equipment Home Assistive Devices/Equipment: None  Therapy Consults (therapy consults require a physician order) PT Evaluation Needed: No OT Evalulation Needed: No SLP Evaluation Needed: No   Values / Beliefs Cultural Requests During Hospitalization: None Spiritual Requests During Hospitalization: None Consults Spiritual Care Consult Needed: No Transition of Care Team Consult Needed: No Advance Directives (For Healthcare) Does Patient Have a Medical Advance Directive?: No Would patient like information on creating a medical advance directive?: No - Patient declined          Disposition: Per Dr. Janese Banks pt to be observed overnight and reassessed in the AM.  Disposition Initial Assessment Completed for this Encounter: Yes  On Site Evaluation by:   Reviewed with Physician:    Kathi Ludwig 10/23/2019 6:31 PM

## 2019-10-23 NOTE — ED Notes (Signed)
Charge nurse made aware pt is positive for COVID.

## 2019-10-23 NOTE — ED Notes (Signed)
asked patient to give urine sample patient state's he can't go right now .

## 2019-10-23 NOTE — ED Notes (Signed)
Patient sleeping comfortably, xray results pending.

## 2019-10-23 NOTE — ED Notes (Signed)
Psych at bedside to consult patient.

## 2019-10-23 NOTE — ED Provider Notes (Signed)
[facility] Emergency Department Provider Note    First MD Initiated Contact with Patient 10/23/19 [phone]     (approximate)  I have reviewed the triage vital signs and the nursing notes.   HISTORY  Chief Complaint Weakness    HPI Gary Kirk is a 54 y.o. male below listed past medical history presents to the ER the [facility] department for evaluation of suicidal ideation with a plan to overdose on pills.  Reportedly lost power in his home 1 week ago.  Has not been eating.  Feels like he is hospital 11 states "I will not do this anymore.  "Has a history of previous suicide attempt.  States he was on medication several years ago but is just been self-medicating with marijuana.  Denies any alcohol or other substance abuse.    Past Medical History:  Diagnosis Date  . AICD (automatic cardioverter/defibrillator) present    a. 10/2013 s/p MDT [id number] BiV ICD (ser# [id number]).  . COPD (chronic obstructive pulmonary disease) ([place])    a. Quit smoking in 2015.  Marland Kitchen HFrEF (heart failure with reduced ejection fraction) ([place])    a. 02/2012 Echo: EF "depressed";  b. 03/2013 Echo: Ef 15%, mod to sev LV dil w/ anteroseptal AK and sev glob HK. Dilated RV w/ reduced fxn. Mild to mod RAE, Sev LAE. Mild TR; c. 11/2013 Echo: EF 10-15%, redcued RV fxn, mild BAE, mild TR.  Marland Kitchen History of ileus   . Hypertension   . Hypokalemia   . Hypothyroidism   . Major depression   . NICM (nonischemic cardiomyopathy) ([place])    a. 02/2012 Echo: depressed EF; b. 02/2012 MV: EF 38%, small fixed apical defect, no ischemia; c. 03/2013 Echo: EF 15%; d. 08/2013 MV: apical ant, septal inf fixed defects, no ischemia; e. 10/2013 s/p MDT [id number] BiV ICD; d. 11/2013 Cath: results not avail, reportedly nl cors; e. 09/2014 MV: Fixed inf defect consistent w/ diaphragmatic atten. No ischemia. EF 28%.  . Permanent atrial fibrillation (HCC)    a. CHA2DS2VASc = 2-->Eliquis.  . Sleep apnea    a. Doesn't tolerate CPAP.     Family History  Problem Relation Age of Onset  . COPD Mother        decsd 2008  . Hypertension Mother   . Atrial fibrillation Mother   . Heart failure Mother   . Cancer Father        unknown cancer, died when patient was 85  . Lung cancer Brother   . Atrial fibrillation Brother    Past Surgical History:  Procedure Laterality Date  . BIV ICD GENERATOR CHANGEOUT N/A 07/21/2019   Procedure: BIV ICD GENERATOR CHANGEOUT;  Surgeon: Deboraha Sprang, MD;  Location: [facility];  Service: Cardiovascular;  Laterality: N/A;  . CARDIAC DEFIBRILLATOR PLACEMENT    . PACEMAKER IMPLANT     Patient Active Problem List   Diagnosis Date Noted  . Chronic systolic dysfunction of left ventricle   . Morbid obesity ([place]) 05/23/2017  . ICD (implantable cardioverter-defibrillator) in place 05/23/2017  . Acute on chronic combined systolic and diastolic CHF (congestive heart failure) ([place])   . Atrial fibrillation with RVR ([place])   . Low TSH level   . Chest pain 05/09/2016  . Pneumonia 05/09/2016  . Hypothyroidism 05/09/2016  . Nausea 05/09/2016  . Hypokalemia 05/09/2016  . Ileus ([place]) 05/09/2016  . Non-ischemic cardiomyopathy ([place])   . Prolonged Q-T interval on ECG   . Creatinine elevation   .  Severe episode of recurrent major depressive disorder, without psychotic features ([place])   . Severe recurrent major depression without psychotic features ([place]) 05/24/2015  . MDD (major depressive disorder) 05/24/2015  . MDD (major depressive disorder), recurrent episode, severe ([place]) 05/24/2015  . Epigastric pain 03/19/2014  . Right upper quadrant abdominal pain 03/19/2014  . AICD discharge 12/01/2013  . CKD (chronic kidney disease), stage II 09/26/2013  . LBBB (left bundle branch block) 09/26/2013  . Medical non-compliance 09/26/2013  . 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 Polysubstance abuse ([place]) 09/26/2013  . Respiratory failure with hypoxia ([place]) 04/04/2013  . Fatigue 04/03/2013   . Palliative care encounter 04/03/2013  . Shock liver 04/03/2013  . Abdominal pain 04/01/2013  . Chronic obstructive pulmonary disease ([place]) 04/01/2013  . Gout 04/01/2013  . Hypertension 04/01/2013  . Chronic systolic congestive heart failure ([place]) 04/01/2013  . Marijuana use 04/01/2013  . Obstructive sleep apnea on CPAP 04/01/2013  . Persistent atrial fibrillation ([place]) 04/01/2013  . Tobacco abuse 04/01/2013      Prior to Admission medications   Medication Sig Start Date End Date Taking? Authorizing Provider  acetaminophen (TYLENOL) 500 MG tablet Take 1,000 mg by mouth every 6 (six) hours as needed for moderate pain or headache.    [provider]  apixaban (ELIQUIS) 5 MG TABS tablet Take 1 tablet (5 mg total) by mouth 2 (two) times daily. 06/12/17   Deboraha Sprang, MD  carvedilol (COREG) 12.5 MG tablet Take 1 tablet (12.5 mg total) by mouth 2 (two) times daily with a meal. 12/26/17   Baldwin [place], PA-C  gabapentin (NEURONTIN) 300 MG capsule Take 300 mg by mouth 3 (three) times daily.     [provider]  levothyroxine (SYNTHROID) 112 MCG tablet Take 1 tablet (112 mcg total) by mouth daily before breakfast. 11/13/17   Noemi Chapel, MD  losartan (COZAAR) 25 MG tablet Take 1 tablet (25 mg total) by mouth daily. 09/02/18 07/09/19  Minna Merritts, MD  PROAIR HFA 108 [phone] Base) MCG/ACT inhaler Inhale 2 puffs into the lungs every 4 (four) hours as needed for wheezing or shortness of breath.  10/19/17   [provider]  SPIRIVA HANDIHALER 18 MCG inhalation capsule Place 1 capsule into inhaler and inhale daily as needed (shortness of breath).  10/07/18   [provider]  SYMBICORT 160-4.5 MCG/ACT inhaler Inhale 2 puffs into the lungs 2 (two) times daily as needed (shortness of breath).  10/07/18   [provider]    Allergies Aripiprazole, Tramadol, and Ibuprofen    Social History Social History   Tobacco Use  . Smoking status: Former  Smoker    Types: Cigarettes    Quit date: 04/29/2014    Years since quitting: 5.4  . Smokeless tobacco: Never Used  Vaping Use  . Vaping Use: Never used  Substance Use Topics  . Alcohol use: No  . Drug use: Yes    Types: Marijuana    Comment: last use last night    Review of Systems Patient denies headaches, rhinorrhea, blurry vision, numbness, shortness of breath, chest pain, edema, cough, abdominal pain, nausea, vomiting, diarrhea, dysuria, fevers, rashes or hallucinations unless otherwise stated above in HPI. ____________________________________________   PHYSICAL EXAM:  VITAL SIGNS: Vitals:   10/23/19 0934  BP: 122/89  Pulse: (!) 126  Resp: 20  Temp: 97.9 F (36.6 C)  SpO2: 96%    Constitutional: Alert and oriented.  Eyes: Conjunctivae are normal.  Head: Atraumatic. Nose: No  congestion/rhinnorhea. Mouth/Throat: Mucous membranes are moist.   Neck: No stridor. Painless ROM.  Cardiovascular: Normal rate, regular rhythm. Grossly normal heart sounds.  Good peripheral circulation. Respiratory: Normal respiratory effort.  No retractions. Lungs CTAB. Gastrointestinal: Soft and nontender. No distention. No abdominal bruits. No CVA tenderness. Genitourinary:  Musculoskeletal: No lower extremity tenderness nor edema.  No joint effusions. Neurologic:  Normal speech and language. No gross focal neurologic deficits are appreciated. No facial droop Skin:  Skin is warm, dry and intact. No rash noted. Psychiatric: Mood and affect are normal. Speech and behavior are normal.  ____________________________________________   LABS (all labs ordered are listed, but only abnormal results are displayed)  Results for orders placed or performed during the hospital encounter of 10/23/19 (from the past 24 hour(s))  Basic metabolic panel     Status: Abnormal   Collection Time: 10/23/19  9:40 AM  Result Value Ref Range   Sodium 137 135 - 145 mmol/L   Potassium 3.1 (L) 3.5 - 5.1 mmol/L    Chloride 97 (L) 98 - 111 mmol/L   CO2 23 22 - 32 mmol/L   Glucose, Bld 151 (H) 70 - 99 mg/dL   BUN 22 (H) 6 - 20 mg/dL   Creatinine, Ser 1.44 (H) 0.61 - 1.24 mg/dL   Calcium 9.6 8.9 - 10.3 mg/dL   GFR calc non Af Amer 55 (L) >60 mL/min   GFR calc Af Amer >60 >60 mL/min   Anion gap 17 (H) 5 - 15  CBC     Status: Abnormal   Collection Time: 10/23/19  9:40 AM  Result Value Ref Range   WBC 9.0 4.0 - 10.5 K/uL   RBC 5.71 4.22 - 5.81 MIL/uL   Hemoglobin 18.1 (H) 13.0 - 17.0 g/dL   HCT 51.0 39 - 52 %   MCV 89.3 80.0 - 100.0 fL   MCH 31.7 26.0 - 34.0 pg   MCHC 35.5 30.0 - 36.0 g/dL   RDW 12.9 11.5 - 15.5 %   Platelets 217 150 - 400 K/uL   nRBC 0.0 0.0 - 0.2 %  Troponin I (High Sensitivity)     Status: Abnormal   Collection Time: 10/23/19  9:40 AM  Result Value Ref Range   Troponin I (High Sensitivity) 34 (H) <18 ng/L  Protime-INR     Status: None   Collection Time: 10/23/19  9:40 AM  Result Value Ref Range   Prothrombin Time 12.5 11.4 - 15.2 seconds   INR 1.0 0.8 - 1.2  Urinalysis, Complete w Microscopic     Status: Abnormal   Collection Time: 10/23/19  1:09 PM  Result Value Ref Range   Color, Urine AMBER (A) YELLOW   APPearance CLOUDY (A) CLEAR   Specific Gravity, Urine 1.031 (H) 1.005 - 1.030   pH 5.0 5.0 - 8.0   Glucose, UA NEGATIVE NEGATIVE mg/dL   Hgb urine dipstick SMALL (A) NEGATIVE   Bilirubin Urine SMALL (A) NEGATIVE   Ketones, ur NEGATIVE NEGATIVE mg/dL   Protein, ur 100 (A) NEGATIVE mg/dL   Nitrite NEGATIVE NEGATIVE   Leukocytes,Ua NEGATIVE NEGATIVE   RBC / HPF 0-5 0 - 5 RBC/hpf   WBC, UA 0-5 0 - 5 WBC/hpf   Bacteria, UA NONE SEEN NONE SEEN   Squamous Epithelial / LPF 0-5 0 - 5   Mucus PRESENT    Hyaline Casts, UA PRESENT   Troponin I (High Sensitivity)     Status: Abnormal   Collection Time: 10/23/19  1:09 PM  Result Value Ref Range   Troponin I (High Sensitivity) 18 (H) <18 ng/L   ____________________________________________  EKG My review and personal  interpretation at Time: 9:32   Indication: medication   Rate: 125  Rhythm: A-s v-p Axis: left Other: paced rhythm, no stemi ____________________________________________  RADIOLOGY  I personally reviewed all radiographic images ordered to evaluate for the above acute complaints and reviewed radiology reports and findings.  These findings were personally discussed with the patient.  Please see medical record for radiology report.  ____________________________________________   PROCEDURES  Procedure(s) performed:  Procedures    Critical Care performed:  ____________________________________________   INITIAL IMPRESSION / ASSESSMENT AND PLAN / ED COURSE  Pertinent labs & imaging results that were available during my care of the patient were reviewed by me and considered in my medical decision making (see chart for details).   DDX: Psychosis, delirium, medication effect, noncompliance, polysubstance abuse, Si, Hi, depression   Gary Kirk is a 54 y.o. who presents to the ED with for evaluation of depression and si.  Patient has psych history of depression and previous si.  Laboratory testing was ordered to evaluation for underlying electrolyte derangement or signs of underlying organic pathology to explain today's presentation.  Patient was  made an IVC due to SI with plan.     Clinical Course as of Oct 23 1434  Thu Oct 23, 2019  1047 Does appear to be dry which would correspond with the patient's mild tachycardia.  Will give IV fluids and as well as encourage p.o. intake.  Troponin is borderline elevated patient is to not complaining any chest pain or shortness of breath at this time.  Will repeat but I suspect this is secondary to known cardiac disease in the setting of mild dehydration.   [PR]  0998 Troponin is downtrending I do believe this is secondary to dehydration for which we have given IV fluids.  At this point he believe is clinically appropriate for psychiatric  evaluation.   [PR]    Clinical Course User Index [PR] Merlyn Lot, MD   The patient has been placed in psychiatric observation due to the need to provide a safe environment for the patient while obtaining psychiatric consultation and evaluation, as well as ongoing medical and medication management to treat the patient's condition.  The patient has been placed under full IVC at this time.   The patient was evaluated in Emergency Department today for the symptoms described in the history of present illness. He/she was evaluated in the context of the global COVID-19 pandemic, which necessitated consideration that the patient might be at risk for infection with the SARS-CoV-2 virus that causes COVID-19. Institutional protocols and algorithms that pertain to the evaluation of patients at risk for COVID-19 are in a state of rapid change based on information released by regulatory bodies including the CDC and federal and state organizations. These policies and algorithms were followed during the patient's care in the ED.  As part of my medical decision making, I reviewed the following data within the [place] notes reviewed and incorporated, Labs reviewed, notes from prior ED visits and [place] Controlled Substance Database   ____________________________________________   FINAL CLINICAL IMPRESSION(S) / ED DIAGNOSES  Final diagnoses:  Suicidal ideation      NEW MEDICATIONS STARTED DURING THIS VISIT:  New Prescriptions   No medications on file     Note:  This document was prepared using Dragon voice recognition software and may include  unintentional dictation errors.    Merlyn Lot, MD 10/23/19 1436

## 2019-10-23 NOTE — ED Notes (Signed)
Pt is clam and cooperative informed about transfer. Pt has no sign of acute distress. Pt was given Tylenol for pain. Other meds where not administered because Rx did not deliver med.

## 2019-10-23 NOTE — ED Notes (Signed)
Tylenol given for headache 6/10. Will reassess.

## 2019-10-23 NOTE — ED Notes (Signed)
Patient off unit to xray

## 2019-10-23 NOTE — Consult Note (Signed)
[facility] Face-to-Face Psychiatry Consult   Reason for Consult:  Adjustment problems SI  On IVC  Referring Physician:  ER MD  Patient Identification: Gary Kirk MRN:  [id number] Principal Diagnosis: Bipolar disorder   Diagnosis:   Same     Total Time spent with patient: 50-60 min  Subjective:   Gary Kirk is a 54 y.o. male patient admitted with adjustment problems SI with possible plans need to observe and treat bipolar disorder and anxiety   HPI:   54 year old obese caucasian male  In BHU unit --overnight.  Homeless for a few months, ran out of psych meds for bipolar   Has dehydration needing IV fluids and felt weak   Also has mood swings, ups and downs, lability, highs and lows irritability edginess and frustration ---lack of energy concentration and attention --lack of sleep speeded racing thoughts and action mixed with generalized anxiety and major depression   Cannot yet contract for safety feels hopeless   Says he has no relatives, lost to follow up in psych clinic, cannot recall meds   Has been living in a construction building    ETOH and substance --smokes MJ daily   ON disability --for cardiac issues he says   Homeless no family   Education HS   Work --none   Land issues none    Past Psychiatric History:  Hospitalized twice at [facility] system a few years ago for similar issues.     Risk to Self: Suicidal Ideation: No Suicidal Intent: No Is patient at risk for suicide?: No Suicidal Plan?: No Access to Means: No What has been your use of drugs/alcohol within the last 12 months?: THC How many times?: 0 Other Self Harm Risks: Depression Triggers for Past Attempts: None known Intentional Self Injurious Behavior: None Risk to Others: Homicidal Ideation: No Thoughts of Harm to Others: No Current Homicidal Intent: No Current Homicidal Plan: No Access to Homicidal Means: No Identified Victim: n/a History of harm to others?: No Assessment of  Violence: None Noted Violent Behavior Description: n/a Does patient have access to weapons?: No Criminal Charges Pending?: No Does patient have a court date: No Prior Inpatient Therapy: Prior Inpatient Therapy: No Prior Outpatient Therapy: Prior Outpatient Therapy: No Does patient have an ACCT team?: No Does patient have Intensive In-House Services?  : No Does patient have [facility] services? : No Does patient have P4CC services?: No  Past Medical History:  Past Medical History:  Diagnosis Date  . AICD (automatic cardioverter/defibrillator) present    a. 10/2013 s/p MDT [id number] BiV ICD (ser# [id number]).  . COPD (chronic obstructive pulmonary disease) ([place])    a. Quit smoking in 2015.  Marland Kitchen HFrEF (heart failure with reduced ejection fraction) ([place])    a. 02/2012 Echo: EF "depressed";  b. 03/2013 Echo: Ef 15%, mod to sev LV dil w/ anteroseptal AK and sev glob HK. Dilated RV w/ reduced fxn. Mild to mod RAE, Sev LAE. Mild TR; c. 11/2013 Echo: EF 10-15%, redcued RV fxn, mild BAE, mild TR.  Marland Kitchen History of ileus   . Hypertension   . Hypokalemia   . Hypothyroidism   . Major depression   . NICM (nonischemic cardiomyopathy) ([place])    a. 02/2012 Echo: depressed EF; b. 02/2012 MV: EF 38%, small fixed apical defect, no ischemia; c. 03/2013 Echo: EF 15%; d. 08/2013 MV: apical ant, septal inf fixed defects, no ischemia; e. 10/2013 s/p MDT [id number] BiV ICD; d. 11/2013 Cath: results not avail, reportedly  nl cors; e. 09/2014 MV: Fixed inf defect consistent w/ diaphragmatic atten. No ischemia. EF 28%.  . Permanent atrial fibrillation (H[facility])    a. CHA2DS2VASc = 2-->Eliquis.  . Sleep apnea    a. Doesn't tolerate CPAP.    Past Surgical History:  Procedure Laterality Date  . BIV ICD GENERATOR CHANGEOUT N/A 07/21/2019   Procedure: BIV ICD GENERATOR CHANGEOUT;  Surgeon: Deboraha Sprang, MD;  Location: [facility];  Service: Cardiovascular;  Laterality: N/A;  . CARDIAC DEFIBRILLATOR PLACEMENT    . PACEMAKER IMPLANT      Family History:  Family History  Problem Relation Age of Onset  . COPD Mother        decsd 2008  . Hypertension Mother   . Atrial fibrillation Mother   . Heart failure Mother   . Cancer Father        unknown cancer, died when patient was 30  . Lung cancer Brother   . Atrial fibrillation Brother    Family Psychiatric  History:  None he says  Social History: ---as above  Social History   Substance and Sexual Activity  Alcohol Use No     Social History   Substance and Sexual Activity  Drug Use Yes  . Types: Marijuana   Comment: last use last night    Social History   Socioeconomic History  . Marital status: Legally Separated    Spouse name: Not on file  . Number of children: Not on file  . Years of education: Not on file  . Highest education level: Not on file  Occupational History  . Not on file  Tobacco Use  . Smoking status: Former Smoker    Types: Cigarettes    Quit date: 04/29/2014    Years since quitting: 5.4  . Smokeless tobacco: Never Used  Vaping Use  . Vaping Use: Never used  Substance and Sexual Activity  . Alcohol use: No  . Drug use: Yes    Types: Marijuana    Comment: last use last night  . Sexual activity: Not on file  Other Topics Concern  . Not on file  Social History Narrative  . Not on file   Social Determinants of Health   Financial Resource Strain:   . Difficulty of Paying Living Expenses: Not on file  Food Insecurity:   . Worried About Charity fundraiser in the Last Year: Not on file  . Ran Out of Food in the Last Year: Not on file  Transportation Needs:   . Lack of Transportation (Medical): Not on file  . Lack of Transportation (Non-Medical): Not on file  Physical Activity:   . Days of Exercise per Week: Not on file  . Minutes of Exercise per Session: Not on file  Stress:   . Feeling of Stress : Not on file  Social Connections:   . Frequency of Communication with Friends and Family: Not on file  . Frequency of Social  Gatherings with Friends and Family: Not on file  . Attends Religious Services: Not on file  . Active Member of Clubs or Organizations: Not on file  . Attends Archivist Meetings: Not on file  . Marital Status: Not on file   Additional Social History:    Allergies:   Allergies  Allergen Reactions  . Aripiprazole Other (See Comments)    Right arm shaking   . Tramadol Shortness Of Breath and Nausea Only    Pt states he has "flu type symptoms"  after receiving tramadol   . Ibuprofen Other (See Comments)    "Bleeding ulcers" per pt Spits up blood     Labs:  Results for orders placed or performed during the hospital encounter of 10/23/19 (from the past 48 hour(s))  Basic metabolic panel     Status: Abnormal   Collection Time: 10/23/19  9:40 AM  Result Value Ref Range   Sodium 137 135 - 145 mmol/L   Potassium 3.1 (L) 3.5 - 5.1 mmol/L   Chloride 97 (L) 98 - 111 mmol/L   CO2 23 22 - 32 mmol/L   Glucose, Bld 151 (H) 70 - 99 mg/dL    Comment: Glucose reference range applies only to samples taken after fasting for at least 8 hours.   BUN 22 (H) 6 - 20 mg/dL   Creatinine, Ser 1.44 (H) 0.61 - 1.24 mg/dL   Calcium 9.6 8.9 - 10.3 mg/dL   GFR calc non Af Amer 55 (L) >60 mL/min   GFR calc Af Amer >60 >60 mL/min   Anion gap 17 (H) 5 - 15    Comment: Performed at [facility], [address] [place], [place] [address]  CBC     Status: Abnormal   Collection Time: 10/23/19  9:40 AM  Result Value Ref Range   WBC 9.0 4.0 - 10.5 K/uL   RBC 5.71 4.22 - 5.81 MIL/uL   Hemoglobin 18.1 (H) 13.0 - 17.0 g/dL   HCT 51.0 39 - 52 %   MCV 89.3 80.0 - 100.0 fL   MCH 31.7 26.0 - 34.0 pg   MCHC 35.5 30.0 - 36.0 g/dL   RDW 12.9 11.5 - 15.5 %   Platelets 217 150 - 400 K/uL   nRBC 0.0 0.0 - 0.2 %    Comment: Performed at [facility], [place], [place] [address]  Troponin I (High Sensitivity)     Status: Abnormal   Collection Time: 10/23/19  9:40 AM  Result  Value Ref Range   Troponin I (High Sensitivity) 34 (H) <18 ng/L    Comment: (NOTE) Elevated high sensitivity troponin I (hsTnI) values and significant  changes across serial measurements may suggest ACS but many other  chronic and acute conditions are known to elevate hsTnI results.  Refer to the "Links" section for chest pain algorithms and additional  guidance. Performed at [facility], [address] [place], [place] [address]   Protime-INR     Status: None   Collection Time: 10/23/19  9:40 AM  Result Value Ref Range   Prothrombin Time 12.5 11.4 - 15.2 seconds   INR 1.0 0.8 - 1.2    Comment: (NOTE) INR goal varies based on device and disease states. Performed at [facility], [address] [place], [place] [address]   Urinalysis, Complete w Microscopic     Status: Abnormal   Collection Time: 10/23/19  1:09 PM  Result Value Ref Range   Color, Urine AMBER (A) YELLOW    Comment: BIOCHEMICALS MAY BE AFFECTED BY COLOR   APPearance CLOUDY (A) CLEAR   Specific Gravity, Urine 1.031 (H) 1.005 - 1.030   pH 5.0 5.0 - 8.0   Glucose, UA NEGATIVE NEGATIVE mg/dL   Hgb urine dipstick SMALL (A) NEGATIVE   Bilirubin Urine SMALL (A) NEGATIVE   Ketones, ur NEGATIVE NEGATIVE mg/dL   Protein, ur 100 (A) NEGATIVE mg/dL   Nitrite NEGATIVE NEGATIVE   Leukocytes,Ua NEGATIVE NEGATIVE   RBC / HPF 0-5 0 -  5 RBC/hpf   WBC, UA 0-5 0 - 5 WBC/hpf   Bacteria, UA NONE SEEN NONE SEEN   Squamous Epithelial / LPF 0-5 0 - 5   Mucus PRESENT    Hyaline Casts, UA PRESENT     Comment: Performed at [facility], [place], [place] [address]  Troponin I (High Sensitivity)     Status: Abnormal   Collection Time: 10/23/19  1:09 PM  Result Value Ref Range   Troponin I (High Sensitivity) 18 (H) <18 ng/L    Comment: (NOTE) Elevated high sensitivity troponin I (hsTnI) values and significant  changes across serial measurements may suggest ACS but many other  chronic  and acute conditions are known to elevate hsTnI results.  Refer to the "Links" section for chest pain algorithms and additional  guidance. Performed at [facility], [address] [place], [place] [address]     Current Facility-Administered Medications  Medication Dose Route Frequency Provider Last Rate Last Admin  . albuterol (VENTOLIN HFA) 108 (90 Base) MCG/ACT inhaler 2 puff  2 puff Inhalation Q4H PRN Eulas Post, MD      . carvedilol (COREG) tablet 12.5 mg  12.5 mg Oral BID WC Merlyn Lot, MD   12.5 mg at 10/23/19 1632  . [START ON 10/24/2019] carvedilol (COREG) tablet 12.5 mg  12.5 mg Oral BID WC Eulas Post, MD      . DULoxetine (CYMBALTA) DR capsule 30 mg  30 mg Oral Daily Eulas Post, MD      . Derrill Memo ON 10/24/2019] levothyroxine (SYNTHROID) tablet 112 mcg  112 mcg Oral QAC breakfast Eulas Post, MD      . losartan (COZAAR) tablet 25 mg  25 mg Oral Daily Eulas Post, MD      . mometasone-formoterol [facility]) 200-5 MCG/ACT inhaler 2 puff  2 puff Inhalation BID Eulas Post, MD      . OLANZapine [facility]) tablet 7.5 mg  7.5 mg Oral QHS Eulas Post, MD      . tiotropium [facility]) inhalation capsule [facility] use ONLY) 18 mcg  1 capsule Inhalation Daily PRN Eulas Post, MD       Current Outpatient Medications  Medication Sig Dispense Refill  . acetaminophen (TYLENOL) 500 MG tablet Take 1,000 mg by mouth every 6 (six) hours as needed for moderate pain or headache.    Marland Kitchen apixaban (ELIQUIS) 5 MG TABS tablet Take 1 tablet (5 mg total) by mouth 2 (two) times daily. 180 tablet 3  . carvedilol (COREG) 12.5 MG tablet Take 1 tablet (12.5 mg total) by mouth 2 (two) times daily with a meal. 60 tablet 6  . gabapentin (NEURONTIN) 300 MG capsule Take 300 mg by mouth 3 (three) times daily.     Marland Kitchen levothyroxine (SYNTHROID) 112 MCG tablet Take 1 tablet (112 mcg total) by mouth daily before breakfast. 30 tablet 1  . losartan (COZAAR) 25 MG tablet Take 1  tablet (25 mg total) by mouth daily. 90 tablet 3  . PROAIR HFA 108 (90 Base) MCG/ACT inhaler Inhale 2 puffs into the lungs every 4 (four) hours as needed for wheezing or shortness of breath.   3  . SPIRIVA HANDIHALER 18 MCG inhalation capsule Place 1 capsule into inhaler and inhale daily as needed (shortness of breath).     . SYMBICORT 160-4.5 MCG/ACT inhaler Inhale 2 puffs into the lungs 2 (two) times daily as needed (shortness of breath).       Musculoskeletal: Strength & Muscle Tone: weak from dehydration  Gait & Station: llimited fatigued  Patient leans: n/a    Handedness not known Akathisia none Recall fair to poor Cognition impaired ADL's limited with stress Sleep affected cannot sleep erratic Assets not clear  Language English   Appearance unkept obese Ruddy complexion Rapport fair  Eye contact fair Mood and affect edgy anxious irritable hopeless forlorn  Speech somewhat loud and pressured Movements no shakes tremors tics  Psychomotor activity on and off high Judgement insight reliability fair to poor Intelligence fund of knowledge below average Abstraction normal Thought content and process --victim themes, abandonment themes anxiety  No psychosis or frank mania       Psychiatric Specialty Exam: Physical Exam  Review of Systems  Blood pressure (!) 124/105, pulse (!) 111, temperature 97.7 F (36.5 C), temperature source Oral, resp. rate 18, height 6\' 1"  (1.854 m), weight (!) 139 kg, SpO2 95 %.Body mass index is 40.43 kg/m.   Treatment Plan Summary:  Patient to be observed overnight  meds started including cymbalta and zyprexa     Disposition: BHU for overnight assessment     Eulas Post, MD 10/23/2019 6:49 PM

## 2019-10-24 MED ORDER — DIPHENHYDRAMINE HCL 50 MG/ML IJ SOLN: 50.0000 mg | Freq: Once | INTRAMUSCULAR | Status: DC | PRN

## 2019-10-24 MED ORDER — ALBUTEROL SULFATE HFA 108 (90 BASE) MCG/ACT IN AERS: 2.0000 | INHALATION_SPRAY | Freq: Once | RESPIRATORY_TRACT | Status: DC | PRN

## 2019-10-24 MED ORDER — SODIUM CHLORIDE 0.9 % IV SOLN
1200.0000 mg | Freq: Once | INTRAVENOUS | Status: AC
  Administered 2019-10-24: 1200 mg via INTRAVENOUS
  Filled 2019-10-24: qty 10

## 2019-10-24 MED ORDER — FAMOTIDINE IN NACL 20-0.9 MG/50ML-% IV SOLN: 20.0000 mg | Freq: Once | INTRAVENOUS | Status: DC | PRN

## 2019-10-24 MED ORDER — METHYLPREDNISOLONE SODIUM SUCC 125 MG IJ SOLR: 125.0000 mg | Freq: Once | INTRAMUSCULAR | Status: DC | PRN

## 2019-10-24 MED ORDER — EPINEPHRINE 0.3 MG/0.3ML IJ SOAJ: 0.3000 mg | Freq: Once | INTRAMUSCULAR | Status: DC | PRN

## 2019-10-24 MED ORDER — SODIUM CHLORIDE 0.9 % IV SOLN: INTRAVENOUS | Status: DC | PRN

## 2019-10-24 NOTE — ED Notes (Signed)
Pt requesting inhaler at this time. Pt c/o feeling like he is having a harder time breathing. NAD noted at this time. RR even, unlabored, and symmetrical. Pharmacy contacted to send inhaler to ED at this time.

## 2019-10-24 NOTE — ED Notes (Signed)
Pt given breakfast tray

## 2019-10-24 NOTE — ED Provider Notes (Signed)
Emergency Medicine Observation Re-evaluation Note  Gary Kirk is a 54 y.o. male, seen on rounds today.  Pt initially presented to the ED for complaints of Weakness Currently, the patient is resting.  Physical Exam  BP 137/88 (BP Location: Left Arm)   Pulse 90   Temp 98.1 F (36.7 C) (Oral)   Resp 18   Ht 1.854 m (6\' 1" )   Wt (!) 139 kg   SpO2 96%   BMI 40.43 kg/m  Physical Exam Gen:  No acute distress Resp:  Breathing easily and comfortably, no accessory muscle usage Neuro:  Moving all four extremities, no gross focal neuro deficits Psych:  Resting currently, calm and cooperative when awake  ED Course / MDM   I have reviewed the labs performed to date as well as medications administered while in observation.  Recent changes in the last 24 hours include being moved back from [place] to the main ED after his Covid test came back positive.  Plan  Current plan is for psychiatric disposition. Patient is under full IVC at this time.   Hinda Kehr, MD 10/24/19 0730

## 2019-10-24 NOTE — ED Notes (Signed)
IVC, COVID+, psych pending   Paulette Blanch, MD 10/24/19 2308

## 2019-10-24 NOTE — Progress Notes (Signed)
Pharmacy COVID-19 Monoclonal Antibody Screening  Gary Kirk was identified as being not hospitalized with symptoms from Covid-19 on admission but an incidental positive PCR has been documented.  The patient may qualify for the use of monoclonal antibodies (mAB) for COVID-19 viral infection to prevent worsening symptoms stemming from Covid-19 infection.  The patient was identified based on a positive COVID-19 PCR and not requiring the use of supplemental oxygen at this time.  This patient meets the FDA criteria for Emergency Use Authorization of casirivimab/imdevimab.  Has a (+) direct SARS-CoV-2 viral test result  Is NOT hospitalized due to COVID-19  Is within 10 days of symptom onset  Has at least one of the high risk factor(s) for progression to severe COVID-19 and/or hospitalization as defined in EUA.  Specific high risk criteria : BMI > 25, Chronic Kidney Disease (CKD) and Chronic Lung Disease  Additionally: The patient has not had a positive COVID-19 PCR in the last 90 days.  The patient is unvaccinated against COVID-19.  Since the patient is unvaccinated and meets high risk criteria, the patient is eligible for mAB administration.   This eligibility and indication for treatment was discussed with the patient's physician: Dr Wendall Stade  Plan: Based on the above discussion, it was decided that the patient will receive one dose of mAB combination.Casirivimab/imdevimab has been ordered. Pharmacy will coordinate administration timing with patient's nurse. Recommended infusion monitoring parameters communicated to the nursing team. Patient agreeable to receive.  Discussed risk of infusion related rxn and allergic rxn  Doreene Eland, PharmD, BCPS.   Work Cell: [phone] 10/24/2019 12:43 PM

## 2019-10-24 NOTE — ED Provider Notes (Signed)
Pharmacy points out that this gentleman is a candidate for monoclonal antibodies due to his obesity COPD and heart disease.  Discussed this with the patient and reviewed the risks and benefits.  Patient is agreeable to doing a monoclonal antibodies.  I also discussed this with Dr. Janese Banks psychiatry who is okay with that as well.  We will begin setting this up.   Nena Polio, MD 10/24/19 1250

## 2019-10-25 DIAGNOSIS — F4321 Adjustment disorder with depressed mood: Secondary | ICD-10-CM

## 2019-10-25 MED ORDER — DULOXETINE HCL 30 MG PO CPEP
30.0000 mg | ORAL_CAPSULE | Freq: Every day | ORAL | 3 refills | Status: DC
Start: 2019-10-26 — End: 2019-12-02

## 2019-10-25 MED ORDER — OLANZAPINE 5 MG PO TABS: 5.0000 mg | ORAL_TABLET | Freq: Every day | ORAL | 0 refills | Status: DC

## 2019-10-25 MED ORDER — OLANZAPINE 5 MG PO TABS: 5.0000 mg | ORAL_TABLET | Freq: Every day | ORAL | Status: DC

## 2019-10-25 NOTE — ED Notes (Signed)
Pt becomes angry when notified of being d/c. States that he is homeless and has no one to call. Agricultural consultant notified. Pt states that he came here for help and didn't get any. This RN gives pt back his stuff to allow him to get dressed.

## 2019-10-25 NOTE — ED Notes (Signed)
Psych at bedside.

## 2019-10-25 NOTE — ED Notes (Signed)
Pt given crackers and PB. 

## 2019-10-25 NOTE — Consult Note (Signed)
[facility] Psych ED Discharge  10/25/2019 12:40 PM Gary Kirk  MRN:  [id number] Principal Problem: Adjustment disorder with depressed mood Discharge Diagnoses: Principal Problem:   Adjustment disorder with depressed mood  Subjective: "No suicidal or homicidal ideations".  Patient seen and evaluated in person by this provider.  Client presented initially with complaints of social issues regarding housing and resources.  Related to his stress he was having some passive suicidal ideations.  Medications were started and client stabilized.  No suicidal/homicidal ideations hallucinations, paranoia, mania, or other concerning issues.  A social work consult was placed to provide resources for the client.  Dr. Mallie Darting reviewed this client and concurs with the plan to discharge.  Psychiatrically stable.  HPI per [facility] Specialist, Gary Kirk: Gary Kirk is an 54 y.o. male. Per triage note: Presents by [employer] he has not eaten or had anything to drink. States he has not had food or power at his house for over 1 week Also states he is having S/I thoughts.  Patient was resting upon this writer's arrival. The patient had poor hygiene but presented with good eye contact. The patient was oriented x4 with logical/relevant thought processes. The patient had an unremarkable speech pattern. When asked why he'd been brought to the ED, the patient stated, "I've been living in a building for last 3 years" Patient expressed that he'd become increasingly depressed and hopeless due to homeless state.The patient presented with a sullen mood and affect. Patient reported no previous psychiatric admissions. Patient admitted to regular marijuana use stating, "I'm a pothead." The patient stated, "I want to get stabilized and back with mental health." Patient reported that he has poor sleep and a fair appetite. Denies any hallucinations, suicidal/homicidal ideations, or paranoia.   Total Time spent with patient: 30  minutes  Past Psychiatric History: depression  Past Medical History:  Past Medical History:  Diagnosis Date  . AICD (automatic cardioverter/defibrillator) present    a. 10/2013 s/p MDT [id number] BiV ICD (ser# [id number]).  . COPD (chronic obstructive pulmonary disease) ([place])    a. Quit smoking in 2015.  Marland Kitchen HFrEF (heart failure with reduced ejection fraction) ([place])    a. 02/2012 Echo: EF "depressed";  b. 03/2013 Echo: Ef 15%, mod to sev LV dil w/ anteroseptal AK and sev glob HK. Dilated RV w/ reduced fxn. Mild to mod RAE, Sev LAE. Mild TR; c. 11/2013 Echo: EF 10-15%, redcued RV fxn, mild BAE, mild TR.  Marland Kitchen History of ileus   . Hypertension   . Hypokalemia   . Hypothyroidism   . Major depression   . NICM (nonischemic cardiomyopathy) ([place])    a. 02/2012 Echo: depressed EF; b. 02/2012 MV: EF 38%, small fixed apical defect, no ischemia; c. 03/2013 Echo: EF 15%; d. 08/2013 MV: apical ant, septal inf fixed defects, no ischemia; e. 10/2013 s/p MDT [id number] BiV ICD; d. 11/2013 Cath: results not avail, reportedly nl cors; e. 09/2014 MV: Fixed inf defect consistent w/ diaphragmatic atten. No ischemia. EF 28%.  . Permanent atrial fibrillation (HCC)    a. CHA2DS2VASc = 2-->Eliquis.  . Sleep apnea    a. Doesn't tolerate CPAP.    Past Surgical History:  Procedure Laterality Date  . BIV ICD GENERATOR CHANGEOUT N/A 07/21/2019   Procedure: BIV ICD GENERATOR CHANGEOUT;  Surgeon: Deboraha Sprang, MD;  Location: [facility];  Service: Cardiovascular;  Laterality: N/A;  . CARDIAC DEFIBRILLATOR PLACEMENT    . PACEMAKER IMPLANT     Family History:  Family History  Problem Relation Age of Onset  . COPD Mother        decsd 2008  . Hypertension Mother   . Atrial fibrillation Mother   . Heart failure Mother   . Cancer Father        unknown cancer, died when patient was 57  . Lung cancer Brother   . Atrial fibrillation Brother    Family Psychiatric  History: none Social History:  Social History    Substance and Sexual Activity  Alcohol Use No     Social History   Substance and Sexual Activity  Drug Use Yes  . Types: Marijuana   Comment: last use last night    Social History   Socioeconomic History  . Marital status: Legally Separated    Spouse name: Not on file  . Number of children: Not on file  . Years of education: Not on file  . Highest education level: Not on file  Occupational History  . Not on file  Tobacco Use  . Smoking status: Former Smoker    Types: Cigarettes    Quit date: 04/29/2014    Years since quitting: 5.4  . Smokeless tobacco: Never Used  Vaping Use  . Vaping Use: Never used  Substance and Sexual Activity  . Alcohol use: No  . Drug use: Yes    Types: Marijuana    Comment: last use last night  . Sexual activity: Not on file  Other Topics Concern  . Not on file  Social History Narrative  . Not on file   Social Determinants of Health   Financial Resource Strain:   . Difficulty of Paying Living Expenses: Not on file  Food Insecurity:   . Worried About Charity fundraiser in the Last Year: Not on file  . Ran Out of Food in the Last Year: Not on file  Transportation Needs:   . Lack of Transportation (Medical): Not on file  . Lack of Transportation (Non-Medical): Not on file  Physical Activity:   . Days of Exercise per Week: Not on file  . Minutes of Exercise per Session: Not on file  Stress:   . Feeling of Stress : Not on file  Social Connections:   . Frequency of Communication with Friends and Family: Not on file  . Frequency of Social Gatherings with Friends and Family: Not on file  . Attends Religious Services: Not on file  . Active Member of Clubs or Organizations: Not on file  . Attends Archivist Meetings: Not on file  . Marital Status: Not on file    Has this patient used any form of tobacco in the last 30 days? (Cigarettes, Smokeless Tobacco, Cigars, and/or Pipes) A prescription for an FDA-approved tobacco  cessation medication was offered at discharge and the patient refused  Current Medications: Current Facility-Administered Medications  Medication Dose Route Frequency Provider Last Rate Last Admin  . 0.9 %  sodium chloride infusion   Intravenous PRN Nena Polio, MD      . acetaminophen (TYLENOL) tablet 1,000 mg  1,000 mg Oral Q6H PRN Eulas Post, MD   1,000 mg at 10/25/19 0001  . albuterol (VENTOLIN HFA) 108 (90 Base) MCG/ACT inhaler 2 puff  2 puff Inhalation Q4H PRN Eulas Post, MD   2 puff at 10/24/19 2041  . albuterol (VENTOLIN HFA) 108 (90 Base) MCG/ACT inhaler 2 puff  2 puff Inhalation Once PRN Nena Polio, MD      . carvedilol (  COREG) tablet 12.5 mg  12.5 mg Oral BID WC Merlyn Lot, MD   12.5 mg at 10/25/19 0834  . diphenhydrAMINE (BENADRYL) injection 50 mg  50 mg Intravenous Once PRN Nena Polio, MD      . DULoxetine (CYMBALTA) DR capsule 30 mg  30 mg Oral Daily Eulas Post, MD   30 mg at 10/25/19 0935  . EPINEPHrine (EPI-PEN) injection 0.3 mg  0.3 mg Intramuscular Once PRN Nena Polio, MD      . famotidine (PEPCID) IVPB 20 mg premix  20 mg Intravenous Once PRN Nena Polio, MD      . levothyroxine (SYNTHROID) tablet 112 mcg  112 mcg Oral QAC breakfast Eulas Post, MD   112 mcg at 10/25/19 0602  . losartan (COZAAR) tablet 25 mg  25 mg Oral Daily Eulas Post, MD   25 mg at 10/25/19 0934  . methylPREDNISolone sodium succinate (SOLU-MEDROL) 125 mg/2 mL injection 125 mg  125 mg Intravenous Once PRN Nena Polio, MD      . mometasone-formoterol [facility]) 200-5 MCG/ACT inhaler 2 puff  2 puff Inhalation BID Eulas Post, MD   2 puff at 10/25/19 [phone]  . OLANZapine (ZYPREXA) tablet 7.5 mg  7.5 mg Oral QHS Eulas Post, MD   7.5 mg at 10/25/19 0001  . tiotropium (SPIRIVA) inhalation capsule ([facility] use ONLY) 18 mcg  1 capsule Inhalation Daily PRN Eulas Post, MD       Current Outpatient Medications  Medication Sig Dispense Refill  .  acetaminophen (TYLENOL) 500 MG tablet Take 1,000 mg by mouth every 6 (six) hours as needed for moderate pain or headache.    Marland Kitchen apixaban (ELIQUIS) 5 MG TABS tablet Take 1 tablet (5 mg total) by mouth 2 (two) times daily. (Patient not taking: Reported on 10/24/2019) 180 tablet 3  . carvedilol (COREG) 12.5 MG tablet Take 1 tablet (12.5 mg total) by mouth 2 (two) times daily with a meal. (Patient not taking: Reported on 10/24/2019) 60 tablet 6  . gabapentin (NEURONTIN) 300 MG capsule Take 300 mg by mouth 3 (three) times daily.  (Patient not taking: Reported on 10/24/2019)    . levothyroxine (SYNTHROID) 112 MCG tablet Take 1 tablet (112 mcg total) by mouth daily before breakfast. (Patient not taking: Reported on 10/24/2019) 30 tablet 1  . losartan (COZAAR) 25 MG tablet Take 1 tablet (25 mg total) by mouth daily. (Patient not taking: Reported on 10/24/2019) 90 tablet 3  . PROAIR HFA 108 (90 Base) MCG/ACT inhaler Inhale 2 puffs into the lungs every 4 (four) hours as needed for wheezing or shortness of breath.  (Patient not taking: Reported on 10/24/2019)  3  . SPIRIVA HANDIHALER 18 MCG inhalation capsule Place 1 capsule into inhaler and inhale daily as needed (shortness of breath).  (Patient not taking: Reported on 10/24/2019)    . SYMBICORT 160-4.5 MCG/ACT inhaler Inhale 2 puffs into the lungs 2 (two) times daily as needed (shortness of breath).  (Patient not taking: Reported on 10/24/2019)     PTA Medications: (Not in a hospital admission)   Musculoskeletal: Strength & Muscle Tone: within normal limits Gait & Station: normal Patient leans: N/A  Psychiatric Specialty Exam: Physical Exam  Review of Systems  Blood pressure 110/67, pulse 70, temperature 97.9 F (36.6 C), temperature source Oral, resp. rate 18, height 6\' 1"  (1.854 m), weight (!) 139 kg, SpO2 93 %.Body mass index is 40.43 kg/m.  General Appearance: Casual  Eye Contact:  Good  Speech:  Normal  Rate  Volume:  Normal  Mood:  Anxious and  Depressed, mild  Affect:  Congruent  Thought Process:  Coherent and Descriptions of Associations: Intact  Orientation:  Full (Time, Place, and Person)  Thought Content:  WDL and Logical  Suicidal Thoughts:  No  Homicidal Thoughts:  No  Memory:  Immediate;   Good Recent;   Good Remote;   Good  Judgement:  Fair  Insight:  Fair  Psychomotor Activity:  Normal  Concentration:  Concentration: Good and Attention Span: Good  Recall:  Good  Fund of Knowledge:  Good  Language:  Good  Akathisia:  No  Handed:  Right  AIMS (if indicated):     Assets:  Leisure Time Physical Health Resilience  ADL's:  Intact  Cognition:  WNL  Sleep:        Demographic Factors:  Male, Caucasian and Living alone  Loss Factors: Financial problems/change in socioeconomic status  Historical Factors: NA  Risk Reduction Factors:   Sense of responsibility to family and Positive coping skills or problem solving skills  Continued Clinical Symptoms:  Depression and anxiety, mild  Cognitive Features That Contribute To Risk:  None    Suicide Risk:  Minimal: No identifiable suicidal ideation.  Patients presenting with no risk factors but with morbid ruminations; may be classified as minimal risk based on the severity of the depressive symptoms   Plan Of Care/Follow-up recommendations:  Adjustment disorder with depressed mood: -Continued Cymbalta 30 mg daily -Continued Zyprexa 7.5 mg at bedtime Activity:  as tolerated Diet:  heart healthy diet  Disposition: discharge home Waylan Boga, NP 10/25/2019, 12:40 PM

## 2019-10-25 NOTE — Discharge Instructions (Signed)
Suicidal Feelings: How to Help Yourself Suicide is when you end your own life. There are many things you can do to help yourself feel better when struggling with these feelings. Many services and people are available to support you and others who struggle with similar feelings.  If you ever feel like you may hurt yourself or others, or have thoughts about taking your own life, get help right away. To get help:  Call your local emergency services (911 in the [place]  The [employer]'s health and human services helpline (211 in the [place]  Go to your nearest emergency department.  Call a suicide hotline to speak with a trained counselor. The following suicide hotlines are available in the [place]: ? 1-800-273-TALK (1-800-273-8255). ? 1-800-SUICIDE (1-800-784-2433). ? 1-888-628-9454. This is a hotline for Spanish speakers. ? 1-800-799-4889. This is a hotline for TTY users. ? 1-866-4-U-TREVOR (1-866-488-7386). This is a hotline for lesbian, gay, bisexual, transgender, or questioning youth. ? For a list of hotlines in [place], visit www.suicide.org/hotlines/international/[place]-suicide-hotlines.html  Contact a crisis center or a local suicide prevention center. To find a crisis center or suicide prevention center: ? Call your local hospital, clinic, community service organization, mental health center, social service provider, or health department. Ask for help with connecting to a crisis center. ? For a list of crisis centers in the [place], visit: suicidepreventionlifeline.org ? For a list of crisis centers in [place], visit: suicideprevention.ca How to help yourself feel better   Promise yourself that you will not do anything extreme when you have suicidal feelings. Remember, there is hope. Many people have gotten through suicidal thoughts and feelings, and you can too. If you have had these feelings before, remind yourself that you can get through them again.  Let family, friends,  teachers, or counselors know how you are feeling. Try not to separate yourself from those who care about you and want to help you. Talk with someone every day, even if you do not feel sociable. Face-to-face conversation is best to help them understand your feelings.  Contact a mental health care provider and work with this person regularly.  Make a safety plan that you can follow during a crisis. Include phone numbers of suicide prevention hotlines, mental health professionals, and trusted friends and family members you can call during an emergency. Save these numbers on your phone.  If you are thinking of taking a lot of medicine, give your medicine to someone who can give it to you as prescribed. If you are on antidepressants and are concerned you will overdose, tell your health care provider so that he or she can give you safer medicines.  Try to stick to your routines. Follow a schedule every day. Make self-care a priority.  Make a list of realistic goals, and cross them off when you achieve them. Accomplishments can give you a sense of worth.  Wait until you are feeling better before doing things that you find difficult or unpleasant.  Do things that you have always enjoyed to take your mind off your feelings. Try reading a book, or listening to or playing music. Spending time outside, in nature, may help you feel better. Follow these instructions at home:   Visit your primary health care provider every year for a checkup.  Work with a mental health care provider as needed.  Eat a well-balanced diet, and eat regular meals.  Get plenty of rest.  Exercise if you are able. Just 30 minutes of exercise each day can   help you feel better.  Take over-the-counter and prescription medicines only as told by your health care provider. Ask your mental health care provider about the possible side effects of any medicines you are taking.  Do not use alcohol or drugs, and remove these substances  from your home.  Remove weapons, poisons, knives, and other deadly items from your home. General recommendations  Keep your living space well lit.  When you are feeling well, write yourself a letter with tips and support that you can read when you are not feeling well.  Remember that life's difficulties can be sorted out with help. Conditions can be treated, and you can learn behaviors and ways of thinking that will help you. Where to find more information  National Suicide Prevention Lifeline: www.suicidepreventionlifeline.org  Hopeline: www.hopeline.com  American Foundation for Suicide Prevention: www.afsp.org  The Trevor Project (for lesbian, gay, bisexual, transgender, or questioning youth): www.thetrevorproject.org Contact a health care provider if:  You feel as though you are a burden to others.  You feel agitated, angry, vengeful, or have extreme mood swings.  You have withdrawn from family and friends. Get help right away if:  You are talking about suicide or wishing to die.  You start making plans for how to commit suicide.  You feel that you have no reason to live.  You start making plans for putting your affairs in order, saying goodbye, or giving your possessions away.  You feel guilt, shame, or unbearable pain, and it seems like there is no way out.  You are frequently using drugs or alcohol.  You are engaging in risky behaviors that could lead to death. If you have any of these symptoms, get help right away. Call emergency services, go to your nearest emergency department or crisis center, or call a suicide crisis helpline. Summary  Suicide is when you take your own life.  Promise yourself that you will not do anything extreme when you have suicidal feelings.  Let family, friends, teachers, or counselors know how you are feeling.  Get help right away if you feel as though life is getting too tough to handle and you are thinking about suicide. This  information is not intended to replace advice given to you by your health care provider. Make sure you discuss any questions you have with your health care provider. Document Revised: 06/06/2018 Document Reviewed: 09/26/2016 Elsevier Patient Education  2020 Elsevier Inc.  

## 2019-10-25 NOTE — ED Notes (Signed)
Pt does not want to wait for social work. Pt's brother coming to pick him up. Pt refused vital signs. Pt signed for paperwork. Ambulatory to lobby.

## 2019-10-25 NOTE — ED Provider Notes (Signed)
Emergency Medicine Observation Re-evaluation Note  Gary Kirk is a 54 y.o. male, seen on rounds today.  Pt initially presented to the ED for complaints of Weakness Currently, the patient is resting comfortably, voices no medical complaints.  Physical Exam  BP 106/67   Pulse 72   Temp 97.9 F (36.6 C) (Oral)   Resp 17   Ht 6\' 1"  (1.854 m)   Wt (!) 139 kg   SpO2 95%   BMI 40.43 kg/m  Physical Exam General: Resting in no acute distress Cardiac: No cyanosis Lungs: Equal rise and fall Psych: Normal  ED Course / MDM  EKG:EKG Interpretation  Date/Time:  Thursday October 23 2019 09:32:22 EDT Ventricular Rate:  125 PR Interval:  100 QRS Duration: 140 QT Interval:  376 QTC Calculation: 542 R Axis:   -109 Text Interpretation: Atrial-sensed ventricular-paced rhythm Biventricular pacemaker detected Abnormal ECG ----------unconfirmed---------- Confirmed by UNCONFIRMED, DOCTOR ([id number]), editor Frederich Cha [phone]) on 10/24/2019 9:44:29 AM  Clinical Course as of Oct 24 541  Thu Oct 23, 2019  1047 Does appear to be dry which would correspond with the patient's mild tachycardia.  Will give IV fluids and as well as encourage p.o. intake.  Troponin is borderline elevated patient is to not complaining any chest pain or shortness of breath at this time.  Will repeat but I suspect this is secondary to known cardiac disease in the setting of mild dehydration.   [PR]  9784 Troponin is downtrending I do believe this is secondary to dehydration for which we have given IV fluids.  At this point he believe is clinically appropriate for psychiatric evaluation.   [PR]    Clinical Course User Index [PR] Merlyn Lot, MD   I have reviewed the labs performed to date as well as medications administered while in observation.  Recent changes in the last 24 hours include no events overnight.  Plan  Current plan is for psychiatric observation and reassessment; continue antibody infusion for  COVID-19. Patient is under full IVC at this time.   Paulette Blanch, MD 10/25/19 [phone]

## 2019-10-25 NOTE — ED Notes (Signed)
Pt given meal tray.

## 2019-10-25 NOTE — Social Work (Signed)
TOC CM/SW consult received and acknowledge.  Assessment in progress.

## 2019-10-25 NOTE — ED Notes (Signed)
Pt remains in personal clothes. Not SI/HI. Due to covid, pt belongings remain in room at this time. Pt given orange juice. Calm, cooperative, denies further needs.

## 2019-10-28 ENCOUNTER — Encounter: Payer: Medicaid Other | Admitting: Internal Medicine

## 2019-11-11 ENCOUNTER — Telehealth: Payer: Self-pay

## 2019-11-11 NOTE — Telephone Encounter (Signed)
I let the pt know that his monitor has been order and should receive it in 7-10 business days. He states as soon as he receive his monitor he will send a transmission.

## 2019-11-14 ENCOUNTER — Ambulatory Visit (INDEPENDENT_AMBULATORY_CARE_PROVIDER_SITE_OTHER): Payer: Medicaid Other | Admitting: *Deleted

## 2019-11-14 DIAGNOSIS — I428 Other cardiomyopathies: Secondary | ICD-10-CM

## 2019-11-16 LAB — CUP PACEART REMOTE DEVICE CHECK
Battery Remaining Longevity: 105 mo
Battery Voltage: 3.08 V
Brady Statistic AP VP Percent: 49.32 %
Brady Statistic AP VS Percent: 0.04 %
Brady Statistic AS VP Percent: 50.08 %
Brady Statistic AS VS Percent: 0.56 %
Brady Statistic RA Percent Paced: 49.1 %
Brady Statistic RV Percent Paced: 98.7 %
Date Time Interrogation Session: 20210917153352
HighPow Impedance: 77 Ohm
Implantable Lead Implant Date: 20150904
Implantable Lead Implant Date: 20150904
Implantable Lead Implant Date: 20150904
Implantable Lead Location: 753858
Implantable Lead Location: 753859
Implantable Lead Location: 753860
Implantable Lead Model: 4298
Implantable Lead Model: 5076
Implantable Lead Model: 6935
Implantable Pulse Generator Implant Date: 20210524
Lead Channel Impedance Value: 195.429
Lead Channel Impedance Value: 205.2 Ohm
Lead Channel Impedance Value: 225.849
Lead Channel Impedance Value: 270.508
Lead Channel Impedance Value: 289.597
Lead Channel Impedance Value: 342 Ohm
Lead Channel Impedance Value: 456 Ohm
Lead Channel Impedance Value: 513 Ohm
Lead Channel Impedance Value: 513 Ohm
Lead Channel Impedance Value: 551 Ohm
Lead Channel Impedance Value: 646 Ohm
Lead Channel Impedance Value: 665 Ohm
Lead Channel Impedance Value: 703 Ohm
Lead Channel Impedance Value: 722 Ohm
Lead Channel Impedance Value: 760 Ohm
Lead Channel Impedance Value: 779 Ohm
Lead Channel Impedance Value: 836 Ohm
Lead Channel Impedance Value: 893 Ohm
Lead Channel Pacing Threshold Amplitude: 0.375 V
Lead Channel Pacing Threshold Amplitude: 0.625 V
Lead Channel Pacing Threshold Amplitude: 0.75 V
Lead Channel Pacing Threshold Pulse Width: 0.4 ms
Lead Channel Pacing Threshold Pulse Width: 0.4 ms
Lead Channel Pacing Threshold Pulse Width: 0.4 ms
Lead Channel Sensing Intrinsic Amplitude: 4 mV
Lead Channel Sensing Intrinsic Amplitude: 4 mV
Lead Channel Sensing Intrinsic Amplitude: 5 mV
Lead Channel Sensing Intrinsic Amplitude: 5 mV
Lead Channel Setting Pacing Amplitude: 1.25 V
Lead Channel Setting Pacing Amplitude: 1.5 V
Lead Channel Setting Pacing Amplitude: 1.5 V
Lead Channel Setting Pacing Pulse Width: 0.4 ms
Lead Channel Setting Pacing Pulse Width: 0.4 ms
Lead Channel Setting Sensing Sensitivity: 0.3 mV

## 2019-11-17 ENCOUNTER — Telehealth: Payer: Self-pay | Admitting: Emergency Medicine

## 2019-11-17 NOTE — Telephone Encounter (Signed)
Patient contacted due to elevated Optivol and decreased thoracic impedance. Patient reports he has been asymptomatic. Patient reports he was homeless and now lives in [place]. He reports he has ran out of Coreg and Eliquis and needs refills. Stressed importance of taking medications . Confirmed that future refills need to be sent to: [facility] [place], [place] [address] Patient has follow up with PCP in [place] on 11/20/19 and follow-up with Dr Caryl Comes on 12/02/19. Education done on importance of keeping both appointments.

## 2019-11-17 NOTE — Progress Notes (Signed)
Remote pacemaker transmission.   

## 2019-11-18 MED ORDER — APIXABAN 5 MG PO TABS
5.0000 mg | ORAL_TABLET | Freq: Two times a day (BID) | ORAL | 0 refills | Status: DC
Start: 2019-11-18 — End: 2020-09-20

## 2019-11-18 MED ORDER — CARVEDILOL 12.5 MG PO TABS: 12.5000 mg | ORAL_TABLET | Freq: Two times a day (BID) | ORAL | 0 refills | Status: AC

## 2019-11-18 NOTE — Telephone Encounter (Signed)
Spoke with pt and advised Coreg and Eliquis 30 day [facility]ill [facility].  Pt has appointment with Dr Caryl Comes 12/02/2019 in [place] office.  Pt thanked Therapist, sports for phone call and refill.

## 2019-11-25 ENCOUNTER — Encounter (INDEPENDENT_AMBULATORY_CARE_PROVIDER_SITE_OTHER): Payer: Self-pay

## 2019-12-02 ENCOUNTER — Encounter: Payer: Self-pay | Admitting: Internal Medicine

## 2019-12-02 ENCOUNTER — Other Ambulatory Visit: Payer: Self-pay

## 2019-12-02 ENCOUNTER — Ambulatory Visit (INDEPENDENT_AMBULATORY_CARE_PROVIDER_SITE_OTHER): Payer: Medicaid Other | Admitting: Internal Medicine

## 2019-12-02 VITALS — BP 130/72 | HR 70 | Ht 71.0 in | Wt 308.0 lb

## 2019-12-02 DIAGNOSIS — I5022 Chronic systolic (congestive) heart failure: Secondary | ICD-10-CM

## 2019-12-02 DIAGNOSIS — I4821 Permanent atrial fibrillation: Secondary | ICD-10-CM

## 2019-12-02 DIAGNOSIS — Z95810 Presence of automatic (implantable) cardiac defibrillator: Secondary | ICD-10-CM | POA: Diagnosis not present

## 2019-12-02 DIAGNOSIS — I428 Other cardiomyopathies: Secondary | ICD-10-CM

## 2019-12-02 LAB — PACEMAKER DEVICE OBSERVATION

## 2019-12-02 NOTE — Patient Instructions (Signed)
Medication Instructions:  - Your physician recommends that you continue on your current medications as directed. Please refer to the Current Medication list given to you today.  *If you need a refill on your cardiac medications before your next appointment, please call your pharmacy*   Lab Work: - none ordered  If you have labs (blood work) drawn today and your tests are completely normal, you will receive your results only by: Marland Kitchen MyChart Message (if you have MyChart) OR . A paper copy in the mail If you have any lab test that is abnormal or we need to change your treatment, we will call you to review the results.   Testing/Procedures: - none ordered   Follow-Up: At [facility], you and your health needs are our priority.  As part of our continuing mission to provide you with exceptional heart care, we have created designated Provider Care Teams.  These Care Teams include your primary Cardiologist (physician) and Advanced Practice Providers (APPs -  Physician Assistants and Nurse Practitioners) who all work together to provide you with the care you need, when you need it.  We recommend signing up for the patient portal called "MyChart".  Sign up information is provided on this After Visit Summary.  MyChart is used to connect with patients for Virtual Visits (Telemedicine).  Patients are able to view lab/test results, encounter notes, upcoming appointments, etc.  Non-urgent messages can be sent to your provider as well.   To learn more about what you can do with MyChart, go to [email or web address].    Your next appointment:   1) 3 months with one of the general cardiologist in [place]  2) May 2022 with Dr. Rayann Heman in [place]   The format for your next appointment:   In Person  Provider:   as above   Other Instructions n/a

## 2019-12-02 NOTE — Progress Notes (Signed)
Patient Care Team: Medicine, [facility] as PCP - General (Internal Medicine) Minna Merritts, MD as PCP - Cardiology (Cardiology) Evans Lance, MD as PCP - Electrophysiology (Cardiology)   HPI  Gary Kirk is a 54 y.o. male seen in follow-up for for NICM and hx of cardiogenicschock 2015 s/p CRT- ICD implant Medtronic 9/35 which was complicated by multiple shocks 2/2 Atrial fibrillation--apparently permanent.    Underwent GEN change 5/21.  The patient denies chest pain, shortness of breath, nocturnal dyspnea, orthopnea or peripheral edema.  There have been no palpitations, lightheadedness or syncope.    OptiVol was found to be elevated.  He has been started on a diuretic.  DATE TEST EF   2/15 Echo 10-15%   1/21 Echo  55-60 %          Date Cr K  Hgb  9/20 1.03 3.1   3//21 0.98 4.2 15  8/21 1.44 3.1 18.1   He has subsequently moved to [place], having lost that he has had--"slowly building back up"   Records and Results Reviewed   Past Medical History:  Diagnosis Date  . AICD (automatic cardioverter/defibrillator) present    a. 10/2013 s/p MDT [id number] BiV ICD (ser# [id number]).  . COPD (chronic obstructive pulmonary disease) ([place])    a. Quit smoking in 2015.  Marland Kitchen HFrEF (heart failure with reduced ejection fraction) ([place])    a. 02/2012 Echo: EF "depressed";  b. 03/2013 Echo: Ef 15%, mod to sev LV dil w/ anteroseptal AK and sev glob HK. Dilated RV w/ reduced fxn. Mild to mod RAE, Sev LAE. Mild TR; c. 11/2013 Echo: EF 10-15%, redcued RV fxn, mild BAE, mild TR.  Marland Kitchen History of ileus   . Hypertension   . Hypokalemia   . Hypothyroidism   . Major depression   . NICM (nonischemic cardiomyopathy) ([place])    a. 02/2012 Echo: depressed EF; b. 02/2012 MV: EF 38%, small fixed apical defect, no ischemia; c. 03/2013 Echo: EF 15%; d. 08/2013 MV: apical ant, septal inf fixed defects, no ischemia; e. 10/2013 s/p MDT [id number] BiV ICD; d. 11/2013 Cath: results not  avail, reportedly nl cors; e. 09/2014 MV: Fixed inf defect consistent w/ diaphragmatic atten. No ischemia. EF 28%.  . Permanent atrial fibrillation (HCC)    a. CHA2DS2VASc = 2-->Eliquis.  . Sleep apnea    a. Doesn't tolerate CPAP.    Past Surgical History:  Procedure Laterality Date  . BIV ICD GENERATOR CHANGEOUT N/A 07/21/2019   Procedure: BIV ICD GENERATOR CHANGEOUT;  Surgeon: Deboraha Sprang, MD;  Location: [facility];  Service: Cardiovascular;  Laterality: N/A;  . CARDIAC DEFIBRILLATOR PLACEMENT    . PACEMAKER IMPLANT      Current Meds  Medication Sig  . acetaminophen (TYLENOL) 500 MG tablet Take 1,000 mg by mouth every 6 (six) hours as needed for moderate pain or headache.  Marland Kitchen apixaban (ELIQUIS) 5 MG TABS tablet Take 1 tablet (5 mg total) by mouth 2 (two) times daily.  . carvedilol (COREG) 12.5 MG tablet Take 1 tablet (12.5 mg total) by mouth 2 (two) times daily with a meal.  . gabapentin (NEURONTIN) 300 MG capsule Take 300 mg by mouth 3 (three) times daily.   Marland Kitchen levothyroxine (SYNTHROID) 150 MCG tablet Take 150 mcg by mouth daily before breakfast.  . OLANZapine (ZYPREXA) 5 MG tablet Take 1 tablet (5 mg total) by mouth at bedtime.  Marland Kitchen PROAIR HFA 108 (90 Base) MCG/ACT inhaler Inhale 2  puffs into the lungs every 4 (four) hours as needed for wheezing or shortness of breath.   . sertraline (ZOLOFT) 50 MG tablet Take 50 mg by mouth daily.  Marland Kitchen SPIRIVA HANDIHALER 18 MCG inhalation capsule Place 1 capsule into inhaler and inhale daily as needed (shortness of breath).   . SYMBICORT 160-4.5 MCG/ACT inhaler Inhale 2 puffs into the lungs 2 (two) times daily as needed (shortness of breath).     Allergies  Allergen Reactions  . Aripiprazole Other (See Comments)    Right arm shaking   . Tramadol Shortness Of Breath and Nausea Only    Pt states he has "flu type symptoms" after receiving tramadol   . Ibuprofen Other (See Comments)    "Bleeding ulcers" per pt Spits up blood        Review of Systems negative except from HPI and PMH  Physical Exam BP 130/72   Pulse 70   Ht 5\' 11"  (1.803 m)   Wt (!) 308 lb (139.7 kg)   BMI 42.96 kg/m    Well developed and Morbidly obese in no acute distress HENT normal Neck supple with JVP-flat Clear Device pocket well healed; without hematoma or erythema.  There is no tethering  Regular rate and rhythm, no  gallop No  murmur Abd-soft with active BS No Clubbing cyanosis  edema Skin-warm and dry A & Oriented  Grossly normal sensory and motor function    ECG AV pacing @ 70 13/14/45 Upright QRS lead V1 negative QRS lead I  CrCl cannot be calculated (Patient's most recent lab result is older than the maximum 21 days allowed.).   Assessment and  Plan  NICM--interval normalization  CHF chronic systolic 2b  ICD - CRTMedtronic  Afib persistent  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  Morbidly obese  Hypokalemia   No intercurrent atrial fibrillation or flutter  No intercurrent Ventricular tachycardia  OptiVol elevated, diuretics initiated.  Impedance is increasing.  No clinical heart failure.  Previous discussions regarding high-voltage therapies and fear of shocks.  Device is programmed in the 1 zone device for over 215 bpm  Blood pressure reasonably controlled  We will recheck potassium  Has moved to evening.  Will establish follow-up with Dr. Rayann Heman for device clinic and general cards in [facility]    Current medicines are reviewed at length with the patient today .  The patient does not  have concerns regarding medicines.

## 2019-12-03 LAB — CUP PACEART INCLINIC DEVICE CHECK
Battery Remaining Longevity: 103 mo
Battery Voltage: 3.05 V
Brady Statistic AP VP Percent: 50.04 %
Brady Statistic AP VS Percent: 0.04 %
Brady Statistic AS VP Percent: 49.41 %
Brady Statistic AS VS Percent: 0.5 %
Brady Statistic RA Percent Paced: 49.85 %
Brady Statistic RV Percent Paced: 98.84 %
Date Time Interrogation Session: 20211005121000
HighPow Impedance: 80 Ohm
Implantable Lead Implant Date: 20150904
Implantable Lead Implant Date: 20150904
Implantable Lead Implant Date: 20150904
Implantable Lead Location: 753858
Implantable Lead Location: 753859
Implantable Lead Location: 753860
Implantable Lead Model: 4298
Implantable Lead Model: 5076
Implantable Lead Model: 6935
Implantable Pulse Generator Implant Date: 20210524
Lead Channel Impedance Value: 175.622
Lead Channel Impedance Value: 184.154
Lead Channel Impedance Value: 225.849
Lead Channel Impedance Value: 233.981
Lead Channel Impedance Value: 249.375
Lead Channel Impedance Value: 342 Ohm
Lead Channel Impedance Value: 361 Ohm
Lead Channel Impedance Value: 399 Ohm
Lead Channel Impedance Value: 456 Ohm
Lead Channel Impedance Value: 551 Ohm
Lead Channel Impedance Value: 589 Ohm
Lead Channel Impedance Value: 589 Ohm
Lead Channel Impedance Value: 665 Ohm
Lead Channel Impedance Value: 665 Ohm
Lead Channel Impedance Value: 665 Ohm
Lead Channel Impedance Value: 760 Ohm
Lead Channel Impedance Value: 874 Ohm
Lead Channel Impedance Value: 874 Ohm
Lead Channel Pacing Threshold Amplitude: 0.5 V
Lead Channel Pacing Threshold Amplitude: 0.5 V
Lead Channel Pacing Threshold Amplitude: 0.75 V
Lead Channel Pacing Threshold Pulse Width: 0.4 ms
Lead Channel Pacing Threshold Pulse Width: 0.4 ms
Lead Channel Pacing Threshold Pulse Width: 0.4 ms
Lead Channel Sensing Intrinsic Amplitude: 4.625 mV
Lead Channel Sensing Intrinsic Amplitude: 4.875 mV
Lead Channel Sensing Intrinsic Amplitude: 5.375 mV
Lead Channel Sensing Intrinsic Amplitude: 6.25 mV
Lead Channel Setting Pacing Amplitude: 1.25 V
Lead Channel Setting Pacing Amplitude: 1.5 V
Lead Channel Setting Pacing Amplitude: 2 V
Lead Channel Setting Pacing Pulse Width: 0.4 ms
Lead Channel Setting Pacing Pulse Width: 0.4 ms
Lead Channel Setting Sensing Sensitivity: 0.3 mV

## 2020-02-13 ENCOUNTER — Ambulatory Visit (INDEPENDENT_AMBULATORY_CARE_PROVIDER_SITE_OTHER): Payer: Medicaid Other

## 2020-02-13 DIAGNOSIS — I428 Other cardiomyopathies: Secondary | ICD-10-CM | POA: Diagnosis not present

## 2020-02-13 LAB — CUP PACEART REMOTE DEVICE CHECK
Battery Remaining Longevity: 102 mo
Battery Voltage: 3.04 V
Brady Statistic AP VP Percent: 52.27 %
Brady Statistic AP VS Percent: 0.05 %
Brady Statistic AS VP Percent: 47.58 %
Brady Statistic AS VS Percent: 0.1 %
Brady Statistic RA Percent Paced: 52.27 %
Brady Statistic RV Percent Paced: 99.72 %
Date Time Interrogation Session: 20211217131620
HighPow Impedance: 94 Ohm
Implantable Lead Implant Date: 20150904
Implantable Lead Implant Date: 20150904
Implantable Lead Implant Date: 20150904
Implantable Lead Location: 753858
Implantable Lead Location: 753859
Implantable Lead Location: 753860
Implantable Lead Model: 4298
Implantable Lead Model: 5076
Implantable Lead Model: 6935
Implantable Pulse Generator Implant Date: 20210524
Lead Channel Impedance Value: 201.488
Lead Channel Impedance Value: 205.114
Lead Channel Impedance Value: 226.51 Ohm
Lead Channel Impedance Value: 260.571
Lead Channel Impedance Value: 266.667
Lead Channel Impedance Value: 361 Ohm
Lead Channel Impedance Value: 456 Ohm
Lead Channel Impedance Value: 475 Ohm
Lead Channel Impedance Value: 532 Ohm
Lead Channel Impedance Value: 589 Ohm
Lead Channel Impedance Value: 608 Ohm
Lead Channel Impedance Value: 665 Ohm
Lead Channel Impedance Value: 703 Ohm
Lead Channel Impedance Value: 703 Ohm
Lead Channel Impedance Value: 760 Ohm
Lead Channel Impedance Value: 779 Ohm
Lead Channel Impedance Value: 874 Ohm
Lead Channel Impedance Value: 893 Ohm
Lead Channel Pacing Threshold Amplitude: 0.375 V
Lead Channel Pacing Threshold Amplitude: 0.75 V
Lead Channel Pacing Threshold Amplitude: 1 V
Lead Channel Pacing Threshold Pulse Width: 0.4 ms
Lead Channel Pacing Threshold Pulse Width: 0.4 ms
Lead Channel Pacing Threshold Pulse Width: 0.4 ms
Lead Channel Sensing Intrinsic Amplitude: 4.875 mV
Lead Channel Sensing Intrinsic Amplitude: 4.875 mV
Lead Channel Sensing Intrinsic Amplitude: 5 mV
Lead Channel Sensing Intrinsic Amplitude: 5 mV
Lead Channel Setting Pacing Amplitude: 1.25 V
Lead Channel Setting Pacing Amplitude: 1.5 V
Lead Channel Setting Pacing Amplitude: 2 V
Lead Channel Setting Pacing Pulse Width: 0.4 ms
Lead Channel Setting Pacing Pulse Width: 0.4 ms
Lead Channel Setting Sensing Sensitivity: 0.3 mV

## 2020-02-26 NOTE — Progress Notes (Signed)
Remote pacemaker transmission.   

## 2020-03-04 ENCOUNTER — Ambulatory Visit: Payer: Medicaid Other | Admitting: Cardiology

## 2020-03-11 NOTE — Progress Notes (Deleted)
Cardiology Office Note  Date: 03/11/2020   ID: Gary Kirk, DOB Apr 01, 1965, MRN [id number]  PCP:  Medicine, [facility]  Cardiologist:  Ida Rogue, MD Electrophysiologist:  Cristopher Peru, MD   Chief Complaint: 67-month cardiac follow-up  History of Present Illness: Gary Kirk is a 55 y.o. male with a history of HFrEF, AICD, COPD, HTN, hypokalemia, hypothyroidism, depression,  ICM, permanent atrial fibrillation, sleep apnea, respiratory failure with hypoxia, polysubstance abuse, tobacco abuse, morbid obesity.  Last encounter with Dr. Caryl Comes on 12/02/2019 for nonischemic cardiomyopathy/chronic systolic heart failure.  ICD and permanent atrial fibrillation.  During that visit he denied any chest pain, shortness of breath, PND, orthopnea, edema no palpitations, lightheadedness or syncope.  OptiVol elevated and he admits started on diuretic.  Had no recent atrial fibrillation or flutter.  No recent ventricular tachycardia.  OptiVol impedance was increasing.  No clinical heart failure.  Blood pressure was reasonably controlled.  Plan is to recheck potassium.  Past Medical History:  Diagnosis Date  . AICD (automatic cardioverter/defibrillator) present    a. 10/2013 s/p MDT [id number] BiV ICD (ser# [id number]).  . COPD (chronic obstructive pulmonary disease) ([place])    a. Quit smoking in 2015.  Marland Kitchen HFrEF (heart failure with reduced ejection fraction) ([place])    a. 02/2012 Echo: EF "depressed";  b. 03/2013 Echo: Ef 15%, mod to sev LV dil w/ anteroseptal AK and sev glob HK. Dilated RV w/ reduced fxn. Mild to mod RAE, Sev LAE. Mild TR; c. 11/2013 Echo: EF 10-15%, redcued RV fxn, mild BAE, mild TR.  Marland Kitchen History of ileus   . Hypertension   . Hypokalemia   . Hypothyroidism   . Major depression   . NICM (nonischemic cardiomyopathy) ([place])    a. 02/2012 Echo: depressed EF; b. 02/2012 MV: EF 38%, small fixed apical defect, no ischemia; c. 03/2013 Echo: EF 15%; d. 08/2013 MV: apical ant, septal inf  fixed defects, no ischemia; e. 10/2013 s/p MDT [id number] BiV ICD; d. 11/2013 Cath: results not avail, reportedly nl cors; e. 09/2014 MV: Fixed inf defect consistent w/ diaphragmatic atten. No ischemia. EF 28%.  . Permanent atrial fibrillation (HCC)    a. CHA2DS2VASc = 2-->Eliquis.  . Sleep apnea    a. Doesn't tolerate CPAP.    Past Surgical History:  Procedure Laterality Date  . BIV ICD GENERATOR CHANGEOUT N/A 07/21/2019   Procedure: BIV ICD GENERATOR CHANGEOUT;  Surgeon: Deboraha Sprang, MD;  Location: [facility];  Service: Cardiovascular;  Laterality: N/A;  . CARDIAC DEFIBRILLATOR PLACEMENT    . PACEMAKER IMPLANT      Current Outpatient Medications  Medication Sig Dispense Refill  . acetaminophen (TYLENOL) 500 MG tablet Take 1,000 mg by mouth every 6 (six) hours as needed for moderate pain or headache.    Marland Kitchen apixaban (ELIQUIS) 5 MG TABS tablet Take 1 tablet (5 mg total) by mouth 2 (two) times daily. 60 tablet 0  . carvedilol (COREG) 12.5 MG tablet Take 1 tablet (12.5 mg total) by mouth 2 (two) times daily with a meal. 60 tablet 0  . gabapentin (NEURONTIN) 300 MG capsule Take 300 mg by mouth 3 (three) times daily.     Marland Kitchen levothyroxine (SYNTHROID) 150 MCG tablet Take 150 mcg by mouth daily before breakfast.    . losartan (COZAAR) 25 MG tablet Take 1 tablet (25 mg total) by mouth daily. (Patient not taking: Reported on 10/24/2019) 90 tablet 3  . OLANZapine (ZYPREXA) 5 MG tablet Take 1 tablet (5  mg total) by mouth at bedtime. 30 tablet 0  . PROAIR HFA 108 (90 Base) MCG/ACT inhaler Inhale 2 puffs into the lungs every 4 (four) hours as needed for wheezing or shortness of breath.   3  . sertraline (ZOLOFT) 50 MG tablet Take 50 mg by mouth daily.    Marland Kitchen SPIRIVA HANDIHALER 18 MCG inhalation capsule Place 1 capsule into inhaler and inhale daily as needed (shortness of breath).     . SYMBICORT 160-4.5 MCG/ACT inhaler Inhale 2 puffs into the lungs 2 (two) times daily as needed (shortness of breath).       No current facility-administered medications for this visit.   Allergies:  Aripiprazole, Tramadol, and Ibuprofen   Social History: The patient  reports that he quit smoking about 5 years ago. His smoking use included cigarettes. He has never used smokeless tobacco. He reports current drug use. Drug: Marijuana. He reports that he does not drink alcohol.   Family History: The patient's family history includes Atrial fibrillation in his brother and mother; COPD in his mother; Cancer in his father; Heart failure in his mother; Hypertension in his mother; Lung cancer in his brother.   ROS:  Please see the history of present illness. Otherwise, complete review of systems is positive for {NONE DEFAULTED:18576::"none"}.  All other systems are reviewed and negative.   Physical Exam: VS:  There were no vitals taken for this visit., BMI There is no height or weight on file to calculate BMI.  Wt Readings from Last 3 Encounters:  12/02/19 (!) 308 lb (139.7 kg)  10/23/19 (!) 306 lb 7 oz (139 kg)  07/21/19 (!) 307 lb (139.3 kg)    General: Patient appears comfortable at rest. HEENT: Conjunctiva and lids normal, oropharynx clear with moist mucosa. Neck: Supple, no elevated JVP or carotid bruits, no thyromegaly. Lungs: Clear to auscultation, nonlabored breathing at rest. Cardiac: Regular rate and rhythm, no S3 or significant systolic murmur, no pericardial rub. Abdomen: Soft, nontender, no hepatomegaly, bowel sounds present, no guarding or rebound. Extremities: No pitting edema, distal pulses 2+. Skin: Warm and dry. Musculoskeletal: No kyphosis. Neuropsychiatric: Alert and oriented x3, affect grossly appropriate.  ECG:  {EKG/Telemetry Strips Reviewed:[phone]}  Recent Labwork: 10/23/2019: BUN 22; Creatinine, Ser 1.44; Hemoglobin 18.1; Platelets 217; Potassium 3.1; Sodium 137  No results found for: CHOL, TRIG, HDL, CHOLHDL, VLDL, LDLCALC, LDLDIRECT  Other Studies Reviewed  Today:  Echocardiogram 03/13/2019  1. Left ventricular ejection fraction, by visual estimation, is 55 to 60%. The left ventricle has normal function. There is no left ventricular hypertrophy. 2. The left ventricle demonstrates global hypokinesis. 3. Global right ventricle has normal systolic function.The right ventricular size is normal. No increase in right ventricular wall thickness. 4. Left atrial size was normal. 5. Right atrial size was normal. 6. The mitral valve is normal in structure. Trivial mitral valve regurgitation. 7. The tricuspid valve is grossly normal. 8. The aortic valve is tricuspid. Aortic valve regurgitation is not visualized. No evidence of aortic valve sclerosis or stenosis. 9. The pulmonic valve was not well visualized. Pulmonic valve regurgitation is not visualized. 10. Aortic dilatation noted. 11. There is mild dilatation of the ascending aorta measuring 39 mm. 12. A pacer wire is visualized in the RA and RV. 13. The inferior vena cava is normal in size with greater than 50% respiratory variability, suggesting right atrial pressure of 3 mmHg.  Assessment and Plan:  No diagnosis found.   Medication Adjustments/Labs and Tests Ordered: Current medicines are reviewed at  length with the patient today.  Concerns regarding medicines are outlined above.   Disposition: Follow-up with ***  Signed, Levell July, NP 03/11/2020 11:29 AM    [facility] [place], [place], [place] [address] Phone: [phone]; Fax: [phone]

## 2020-03-12 ENCOUNTER — Ambulatory Visit: Payer: Medicaid Other | Admitting: Family Medicine

## 2020-03-19 ENCOUNTER — Ambulatory Visit: Payer: Medicaid Other | Admitting: Family Medicine

## 2020-03-29 NOTE — Progress Notes (Addendum)
Cardiology Office Note  Date: 03/30/2020   ID: ARY LAVINE, DOB [birth year], MRN [id number]  PCP:  Medicine, [facility]  Cardiologist:  Ida Rogue, MD Electrophysiologist:  Cristopher Peru, MD   Chief Complaint: 74-month cardiac follow-up  History of Present Illness: ERAGON HAMMOND is a 55 y.o. male with a history of HFrEF, AICD, COPD, HTN, hypokalemia, hypothyroidism, depression,  NICM, permanent atrial fibrillation, sleep apnea, respiratory failure with hypoxia, polysubstance abuse, tobacco abuse, morbid obesity.  Last encounter with Dr. Caryl Comes on 12/02/2019 for nonischemic cardiomyopathy/chronic systolic heart failure.  ICD and permanent atrial fibrillation.  During that visit he denied any chest pain, shortness of breath, PND, orthopnea, edema no palpitations, lightheadedness or syncope.  OptiVol elevated and he was started on diuretic.  Had no recent atrial fibrillation or flutter.  No recent ventricular tachycardia.  OptiVol impedance was increasing.  No clinical heart failure.  Blood pressure was reasonably controlled.   Patient is here for follow-up.  States he has been doing very well.  Denies any recent shortness of breath or dyspnea on exertion.  No anginal or exertional symptoms.  Denies any sensation of palpitations or arrhythmias.  No lower extremity.  Weight today 338 .  He states he is taking furosemide which was prescribed by his primary care physician but only when he feels like it.  Currently taking Lasix 20 mg daily.  This is is not on our medication list.  Patient states he is not totally compliant with diuretic therapy.  Also states he has been eating a lot and not adhering to salt restrictions as he should.  His blood pressure is elevated today at 148/80.  Patient states he has not taken his antihypertensive medication today.  Past Medical History:  Diagnosis Date  . AICD (automatic cardioverter/defibrillator) present    a. 10/2013 s/p MDT [id number] BiV ICD  (ser# [id number] H).  . COPD (chronic obstructive pulmonary disease) ([place])    a. Quit smoking in 2015.  Marland Kitchen HFrEF (heart failure with reduced ejection fraction) ([place])    a. 02/2012 Echo: EF "depressed";  b. 03/2013 Echo: Ef 15%, mod to sev LV dil w/ anteroseptal AK and sev glob HK. Dilated RV w/ reduced fxn. Mild to mod RAE, Sev LAE. Mild TR; c. 11/2013 Echo: EF 10-15%, redcued RV fxn, mild BAE, mild TR.  Marland Kitchen History of ileus   . Hypertension   . Hypokalemia   . Hypothyroidism   . Major depression   . NICM (nonischemic cardiomyopathy) ([place])    a. 02/2012 Echo: depressed EF; b. 02/2012 MV: EF 38%, small fixed apical defect, no ischemia; c. 03/2013 Echo: EF 15%; d. 08/2013 MV: apical ant, septal inf fixed defects, no ischemia; e. 10/2013 s/p MDT [id number] BiV ICD; d. 11/2013 Cath: results not avail, reportedly nl cors; e. 09/2014 MV: Fixed inf defect consistent w/ diaphragmatic atten. No ischemia. EF 28%.  . Permanent atrial fibrillation (HCC)    a. CHA2DS2VASc = 2-->Eliquis.  . Sleep apnea    a. Doesn't tolerate CPAP.    Past Surgical History:  Procedure Laterality Date  . BIV ICD GENERATOR CHANGEOUT N/A 07/21/2019   Procedure: BIV ICD GENERATOR CHANGEOUT;  Surgeon: Deboraha Sprang, MD;  Location: [facility];  Service: Cardiovascular;  Laterality: N/A;  . CARDIAC DEFIBRILLATOR PLACEMENT    . PACEMAKER IMPLANT      Current Outpatient Medications  Medication Sig Dispense Refill  . acetaminophen (TYLENOL) 500 MG tablet Take 1,000 mg by mouth every 6 (six)  hours as needed for moderate pain or headache.    Marland Kitchen apixaban (ELIQUIS) 5 MG TABS tablet Take 1 tablet (5 mg total) by mouth 2 (two) times daily. 60 tablet 0  . carvedilol (COREG) 12.5 MG tablet Take 1 tablet (12.5 mg total) by mouth 2 (two) times daily with a meal. 60 tablet 0  . furosemide (LASIX) 20 MG tablet Take 1 tablet (20 mg total) by mouth daily.    Marland Kitchen gabapentin (NEURONTIN) 300 MG capsule Take 300 mg by mouth 3 (three) times daily.     Marland Kitchen  levothyroxine (SYNTHROID) 150 MCG tablet Take 150 mcg by mouth daily before breakfast.    . OLANZapine (ZYPREXA) 5 MG tablet Take 1 tablet (5 mg total) by mouth at bedtime. 30 tablet 0  . PROAIR HFA 108 (90 Base) MCG/ACT inhaler Inhale 2 puffs into the lungs every 4 (four) hours as needed for wheezing or shortness of breath.   3  . sertraline (ZOLOFT) 50 MG tablet Take 50 mg by mouth daily.    Marland Kitchen SPIRIVA HANDIHALER 18 MCG inhalation capsule Place 1 capsule into inhaler and inhale daily as needed (shortness of breath).     . SYMBICORT 160-4.5 MCG/ACT inhaler Inhale 2 puffs into the lungs 2 (two) times daily as needed (shortness of breath).      No current facility-administered medications for this visit.   Allergies:  Aripiprazole, Tramadol, and Ibuprofen   Social History: The patient  reports that he quit smoking about 5 years ago. His smoking use included cigarettes. He has never used smokeless tobacco. He reports current drug use. Drug: Marijuana. He reports that he does not drink alcohol.   Family History: The patient's family history includes Atrial fibrillation in his brother and mother; COPD in his mother; Cancer in his father; Heart failure in his mother; Hypertension in his mother; Lung cancer in his brother.   ROS:  Please see the history of present illness. Otherwise, complete review of systems is positive for none.  All other systems are reviewed and negative.   Physical Exam: VS:  BP (!) 148/80   Pulse 69   Ht 5\' 11"  (1.803 m)   Wt (!) 339 lb 3.2 oz (153.9 kg)   SpO2 96%   BMI 47.31 kg/m , BMI Body mass index is 47.31 kg/m.  Wt Readings from Last 3 Encounters:  03/30/20 (!) 339 lb 3.2 oz (153.9 kg)  12/02/19 (!) 308 lb (139.7 kg)  10/23/19 (!) 306 lb 7 oz (139 kg)    General: Morbidly obese patient appears comfortable at rest. Neck: Supple, no elevated JVP or carotid bruits, no thyromegaly. Lungs: Clear to auscultation, nonlabored breathing at rest. Cardiac: Regular rate  and rhythm, no S3 or significant systolic murmur, no pericardial rub. Extremities: No pitting edema, distal pulses 2+. Skin: Warm and dry. Musculoskeletal: No kyphosis. Neuropsychiatric: Alert and oriented x3, affect grossly appropriate.  ECG:  An ECG dated 03/30/2020 was personally reviewed today and demonstrated:  Electronic atrial pacemaker rate of 70, right bundle branch block, possible lateral infarct, age undetermined.  Recent Labwork: 10/23/2019: BUN 22; Creatinine, Ser 1.44; Hemoglobin 18.1; Platelets 217; Potassium 3.1; Sodium 137  No results found for: CHOL, TRIG, HDL, CHOLHDL, VLDL, LDLCALC, LDLDIRECT  Other Studies Reviewed Today:  Echocardiogram 03/13/2019 1. Left ventricular ejection fraction, by visual estimation, is 55 to 60%. The left ventricle has normal function. There is no left ventricular hypertrophy. 2. The left ventricle demonstrates global hypokinesis. 3. Global right ventricle has normal systolic  function.The right ventricular size is normal. No increase in right ventricular wall thickness. 4. Left atrial size was normal. 5. Right atrial size was normal. 6. The mitral valve is normal in structure. Trivial mitral valve regurgitation. 7. The tricuspid valve is grossly normal. 8. The aortic valve is tricuspid. Aortic valve regurgitation is not visualized. No evidence of aortic valve sclerosis or stenosis. 9. The pulmonic valve was not well visualized. Pulmonic valve regurgitation is not visualized. 10. Aortic dilatation noted. 11. There is mild dilatation of the ascending aorta measuring 39 mm. 12. A pacer wire is visualized in the RA and RV. 13. The inferior vena cava is normal in size with greater than 50% respiratory variability, suggesting right atrial pressure of 3 mmHg.  Assessment and Plan:  1. Permanent atrial fibrillation ([place])   2. Chronic systolic heart failure ([place])   3. Non-ischemic cardiomyopathy ([place])   4. Essential hypertension    1.  Permanent atrial fibrillation (HCC) EKG today shows electronic atrial pacemaker rate of 70, right bundle branch block, possible lateral infarct.  No underlying atrial fibrillation noted.  Continue carvedilol 12.5 mg p.o. twice daily.  Continue Eliquis 5 mg p.o. twice daily.  2. Chronic systolic heart failure (HCC) Non-ischemic cardiomyopathy (HCC) Echocardiogram 03/13/2019 EF 55 to 60%.  No LVH, LV global hypokinesis.  Trivial MR, mild dilatation of ascending aorta measuring 39 mm.  Denies any shortness of breath, PND, orthopnea, lower extremity edema.  Weight today is 339 pounds..  In October with visit with Dr. Caryl Comes his weight was 308.  He had not been very compliant with diuretic therapy recently stating he took the Lasix when he felt like it and states he has been eating large amounts of food with increased sodium content.  Advised compliance with diuretic therapy and continue Lasix 20 mg p.o. daily.  He does not have home scales.  Advised him to purchase a home scale to measure weights.  3.  Essential hypertension Blood pressure elevated today at 148/80.  Patient states he has not taken his carvedilol yet today.  Medication Adjustments/Labs and Tests Ordered: Current medicines are reviewed at length with the patient today.  Concerns regarding medicines are outlined above.   Disposition: Follow-up with Dr. Domenic Polite or APP 6 months.   Signed, Levell July, NP 03/30/2020 3:48 PM    [facility] at [place], [place], [place] [address] Phone: [phone]; Fax: [phone]

## 2020-03-30 ENCOUNTER — Ambulatory Visit (INDEPENDENT_AMBULATORY_CARE_PROVIDER_SITE_OTHER): Payer: Medicaid Other | Admitting: Family Medicine

## 2020-03-30 ENCOUNTER — Encounter: Payer: Self-pay | Admitting: Family Medicine

## 2020-03-30 VITALS — BP 148/80 | HR 69 | Ht 71.0 in | Wt 339.2 lb

## 2020-03-30 DIAGNOSIS — I428 Other cardiomyopathies: Secondary | ICD-10-CM

## 2020-03-30 DIAGNOSIS — I10 Essential (primary) hypertension: Secondary | ICD-10-CM

## 2020-03-30 DIAGNOSIS — I4821 Permanent atrial fibrillation: Secondary | ICD-10-CM

## 2020-03-30 DIAGNOSIS — I5022 Chronic systolic (congestive) heart failure: Secondary | ICD-10-CM | POA: Diagnosis not present

## 2020-03-30 MED ORDER — FUROSEMIDE 20 MG PO TABS: 20.0000 mg | ORAL_TABLET | Freq: Every day | ORAL | Status: AC

## 2020-03-30 NOTE — Progress Notes (Signed)
Sorry, I guess I had not refreshed the screen. He has not been very compliant with this Lasix therapy per his statement. He stated he took it when he felt like it. Has been eating huge amounts of food with high sodium content he says. I will call him myself to make sure he is more compliant with his Lasix therapy. I did discuss with him the significance of fluid restriction in addition to sodium restriction.

## 2020-03-30 NOTE — Patient Instructions (Addendum)
Medication Instructions:  Continue all current medications.   Labwork: none  Testing/Procedures: none  Follow-Up: 6 months   Any Other Special Instructions Will Be Listed Below (If Applicable).   If you need a refill on your cardiac medications before your next appointment, please call your pharmacy.  

## 2020-03-31 NOTE — Addendum Note (Signed)
Addended by: Merlene Laughter on: 03/31/2020 02:38 PM   Modules accepted: Orders

## 2020-05-14 ENCOUNTER — Ambulatory Visit (INDEPENDENT_AMBULATORY_CARE_PROVIDER_SITE_OTHER): Payer: Medicaid Other

## 2020-05-14 DIAGNOSIS — I4821 Permanent atrial fibrillation: Secondary | ICD-10-CM

## 2020-05-14 LAB — CUP PACEART REMOTE DEVICE CHECK
Battery Remaining Longevity: 99 mo
Battery Voltage: 3.03 V
Brady Statistic AP VP Percent: 51.8 %
Brady Statistic AP VS Percent: 0.04 %
Brady Statistic AS VP Percent: 48.02 %
Brady Statistic AS VS Percent: 0.14 %
Brady Statistic RA Percent Paced: 51.74 %
Brady Statistic RV Percent Paced: 99.58 %
Date Time Interrogation Session: 20220318113652
HighPow Impedance: 88 Ohm
Implantable Lead Implant Date: 20150904
Implantable Lead Implant Date: 20150904
Implantable Lead Implant Date: 20150904
Implantable Lead Location: 753858
Implantable Lead Location: 753859
Implantable Lead Location: 753860
Implantable Lead Model: 4298
Implantable Lead Model: 5076
Implantable Lead Model: 6935
Implantable Pulse Generator Implant Date: 20210524
Lead Channel Impedance Value: 216.848
Lead Channel Impedance Value: 224.438
Lead Channel Impedance Value: 249.375
Lead Channel Impedance Value: 277.083
Lead Channel Impedance Value: 289.597
Lead Channel Impedance Value: 399 Ohm
Lead Channel Impedance Value: 475 Ohm
Lead Channel Impedance Value: 513 Ohm
Lead Channel Impedance Value: 589 Ohm
Lead Channel Impedance Value: 589 Ohm
Lead Channel Impedance Value: 665 Ohm
Lead Channel Impedance Value: 665 Ohm
Lead Channel Impedance Value: 760 Ohm
Lead Channel Impedance Value: 760 Ohm
Lead Channel Impedance Value: 817 Ohm
Lead Channel Impedance Value: 817 Ohm
Lead Channel Impedance Value: 931 Ohm
Lead Channel Impedance Value: 988 Ohm
Lead Channel Pacing Threshold Amplitude: 0.375 V
Lead Channel Pacing Threshold Amplitude: 0.75 V
Lead Channel Pacing Threshold Amplitude: 1 V
Lead Channel Pacing Threshold Pulse Width: 0.4 ms
Lead Channel Pacing Threshold Pulse Width: 0.4 ms
Lead Channel Pacing Threshold Pulse Width: 0.4 ms
Lead Channel Sensing Intrinsic Amplitude: 5.125 mV
Lead Channel Sensing Intrinsic Amplitude: 5.125 mV
Lead Channel Sensing Intrinsic Amplitude: 6.625 mV
Lead Channel Sensing Intrinsic Amplitude: 6.625 mV
Lead Channel Setting Pacing Amplitude: 1.25 V
Lead Channel Setting Pacing Amplitude: 1.5 V
Lead Channel Setting Pacing Amplitude: 2 V
Lead Channel Setting Pacing Pulse Width: 0.4 ms
Lead Channel Setting Pacing Pulse Width: 0.4 ms
Lead Channel Setting Sensing Sensitivity: 0.3 mV

## 2020-05-21 NOTE — Progress Notes (Signed)
Remote pacemaker transmission.   

## 2020-06-10 ENCOUNTER — Encounter: Payer: Self-pay | Admitting: Internal Medicine

## 2020-07-01 ENCOUNTER — Encounter: Payer: Self-pay | Admitting: Gastroenterology

## 2020-07-02 ENCOUNTER — Ambulatory Visit (INDEPENDENT_AMBULATORY_CARE_PROVIDER_SITE_OTHER): Payer: Medicaid Other | Admitting: Internal Medicine

## 2020-07-02 ENCOUNTER — Encounter: Payer: Self-pay | Admitting: Internal Medicine

## 2020-07-02 VITALS — BP 154/90 | HR 70 | Ht 73.0 in | Wt 353.0 lb

## 2020-07-02 DIAGNOSIS — I519 Heart disease, unspecified: Secondary | ICD-10-CM | POA: Diagnosis not present

## 2020-07-02 DIAGNOSIS — G4733 Obstructive sleep apnea (adult) (pediatric): Secondary | ICD-10-CM

## 2020-07-02 DIAGNOSIS — I4819 Other persistent atrial fibrillation: Secondary | ICD-10-CM

## 2020-07-02 DIAGNOSIS — I428 Other cardiomyopathies: Secondary | ICD-10-CM

## 2020-07-02 NOTE — Progress Notes (Signed)
PCP: Neale Burly, MD Primary Cardiologist: recently seen by Katina Dung Primary EP: Dr Stormy Card is a 55 y.o. male who presents today for routine electrophysiology followup. He has moved to [facility] and will now follow with me.  He previously had EP care with Dr Caryl Comes.  Since last being seen in our clinic, the patient reports doing very well.  He states "I feel great".  Today, he denies symptoms of palpitations, chest pain, shortness of breath,  lower extremity edema, dizziness, presyncope, syncope, or ICD shocks.  The patient is otherwise without complaint today.   Past Medical History:  Diagnosis Date  . AICD (automatic cardioverter/defibrillator) present    a. 10/2013 s/p MDT [id number] BiV ICD (ser# [id number]).  . COPD (chronic obstructive pulmonary disease) ([place])    a. Quit smoking in 2015.  Marland Kitchen HFrEF (heart failure with reduced ejection fraction) ([place])    a. 02/2012 Echo: EF "depressed";  b. 03/2013 Echo: Ef 15%, mod to sev LV dil w/ anteroseptal AK and sev glob HK. Dilated RV w/ reduced fxn. Mild to mod RAE, Sev LAE. Mild TR; c. 11/2013 Echo: EF 10-15%, redcued RV fxn, mild BAE, mild TR.  Marland Kitchen History of ileus   . Hypertension   . Hypokalemia   . Hypothyroidism   . Major depression   . NICM (nonischemic cardiomyopathy) ([place])    a. 02/2012 Echo: depressed EF; b. 02/2012 MV: EF 38%, small fixed apical defect, no ischemia; c. 03/2013 Echo: EF 15%; d. 08/2013 MV: apical ant, septal inf fixed defects, no ischemia; e. 10/2013 s/p MDT [id number] BiV ICD; d. 11/2013 Cath: results not avail, reportedly nl cors; e. 09/2014 MV: Fixed inf defect consistent w/ diaphragmatic atten. No ischemia. EF 28%.  . Persistent atrial fibrillation (HCC)    a. CHA2DS2VASc = 2-->Eliquis.  . Sleep apnea    a. Doesn't tolerate CPAP.   Past Surgical History:  Procedure Laterality Date  . BIV ICD GENERATOR CHANGEOUT N/A 07/21/2019   Procedure: BIV ICD GENERATOR CHANGEOUT;  Surgeon: Deboraha Sprang, MD;  Location:  [facility];  Service: Cardiovascular;  Laterality: N/A;  . CARDIAC DEFIBRILLATOR PLACEMENT    . PACEMAKER IMPLANT      ROS- all systems are reviewed and negative except as per HPI above  Current Outpatient Medications  Medication Sig Dispense Refill  . acetaminophen (TYLENOL) 500 MG tablet Take 1,000 mg by mouth every 6 (six) hours as needed for moderate pain or headache.    Marland Kitchen apixaban (ELIQUIS) 5 MG TABS tablet Take 1 tablet (5 mg total) by mouth 2 (two) times daily. 60 tablet 0  . carvedilol (COREG) 12.5 MG tablet Take 1 tablet (12.5 mg total) by mouth 2 (two) times daily with a meal. 60 tablet 0  . furosemide (LASIX) 20 MG tablet Take 1 tablet (20 mg total) by mouth daily.    Marland Kitchen gabapentin (NEURONTIN) 300 MG capsule Take 300 mg by mouth 3 (three) times daily.     Marland Kitchen levothyroxine (SYNTHROID) 150 MCG tablet Take 150 mcg by mouth daily before breakfast.    . PROAIR HFA 108 (90 Base) MCG/ACT inhaler Inhale 2 puffs into the lungs every 4 (four) hours as needed for wheezing or shortness of breath.   3  . sertraline (ZOLOFT) 50 MG tablet Take 50 mg by mouth daily.    Marland Kitchen SPIRIVA HANDIHALER 18 MCG inhalation capsule Place 1 capsule into inhaler and inhale daily as needed (shortness of breath).     Marland Kitchen  SYMBICORT 160-4.5 MCG/ACT inhaler Inhale 2 puffs into the lungs 2 (two) times daily as needed (shortness of breath).      No current facility-administered medications for this visit.    Physical Exam: Vitals:   07/02/20 1203  BP: (!) 154/90  Pulse: 70  SpO2: 97%  Weight: (!) 353 lb (160.1 kg)  Height: 6\' 1"  (1.854 m)    GEN- The patient is overweight appearing, alert and oriented x 3 today.   Head- normocephalic, atraumatic Eyes-  Sclera clear, conjunctiva pink Ears- hearing intact Oropharynx- clear Lungs-   normal work of breathing Chest- ICD pocket is well healed Heart- Regular rate and rhythm  GI- soft,  Extremities- no clubbing, cyanosis, or edema  ICD interrogation-  reviewed in detail today,  See PACEART report    Wt Readings from Last 3 Encounters:  07/02/20 (!) 353 lb (160.1 kg)  03/30/20 (!) 339 lb 3.2 oz (153.9 kg)  12/02/19 (!) 308 lb (139.7 kg)    Assessment and Plan:  1.  Chronic systolic dysfunction/ nonischemic CM euvolemic today Stable on an appropriate medical regimen Normal ICD function See [facility] report No changes today he is not device dependant today We will enroll in ICM device clinic  2. HTN Stable No change required today  3. Afib chads2vasc sore is 2.  He is on eliquis Prior notes describe AF as "permanent" though burden is very low by device interrogation today Per Dr Aquilla Hacker notes, he has had prior inappropriate ICD therapy due to AF with RVR  4. Morbid obesity Body mass index is 46.57 kg/m. Lifestyle modification advised  5. OSA Not compliant with CPAP  Return in a year  Thompson Grayer MD, [facility] 07/02/2020 12:32 PM

## 2020-07-02 NOTE — Patient Instructions (Signed)
Medication Instructions:  Continue all current medications.  Labwork: none  Testing/Procedures: none  Follow-Up: 1 year - [place]   Any Other Special Instructions Will Be Listed Below (If Applicable).  If you need a refill on your cardiac medications before your next appointment, please call your pharmacy.  

## 2020-07-06 ENCOUNTER — Telehealth: Payer: Self-pay

## 2020-07-06 NOTE — Telephone Encounter (Signed)
-----   Message from Thompson Grayer, MD sent at 07/02/2020 12:21 PM EDT ----- Please enroll in ICM device clinic He is interested. Send your notes to me rather than SK as the patient has moved to [facility] and now follows with me  Thanks!

## 2020-07-06 NOTE — Telephone Encounter (Signed)
Spoke with patient and agreeable to monthly follow up.  Advised monitor should be by bedside in order for it to automatically transmit a report during sleep hours of 12 midnight and 6 AM.  He reports his monitor is in the living room because the electrical outlets do not work in his bedroom.  Advised if automatic report not received then will call to assist with manual transmission. Advised will receive a call after the transmission is reviewed to provide results.  Provided ICM number and explained should call if experiencing any fluid symptoms such as weight gain, shortness of breath or extremity/abdominal swelling. 1st ICM remote transmission scheduled for 08/16/2020

## 2020-08-13 ENCOUNTER — Ambulatory Visit (INDEPENDENT_AMBULATORY_CARE_PROVIDER_SITE_OTHER): Payer: Medicaid Other

## 2020-08-13 DIAGNOSIS — I428 Other cardiomyopathies: Secondary | ICD-10-CM | POA: Diagnosis not present

## 2020-08-15 LAB — CUP PACEART REMOTE DEVICE CHECK
Battery Remaining Longevity: 97 mo
Battery Voltage: 3.02 V
Brady Statistic AP VP Percent: 54.12 %
Brady Statistic AP VS Percent: 0.04 %
Brady Statistic AS VP Percent: 45.69 %
Brady Statistic AS VS Percent: 0.14 %
Brady Statistic RA Percent Paced: 53.94 %
Brady Statistic RV Percent Paced: 99.34 %
Date Time Interrogation Session: 20220617113948
HighPow Impedance: 97 Ohm
Implantable Lead Implant Date: 20150904
Implantable Lead Implant Date: 20150904
Implantable Lead Implant Date: 20150904
Implantable Lead Location: 753858
Implantable Lead Location: 753859
Implantable Lead Location: 753860
Implantable Lead Model: 4298
Implantable Lead Model: 5076
Implantable Lead Model: 6935
Implantable Pulse Generator Implant Date: 20210524
Lead Channel Impedance Value: 1064 Ohm
Lead Channel Impedance Value: 245.538
Lead Channel Impedance Value: 245.538
Lead Channel Impedance Value: 270.508
Lead Channel Impedance Value: 295.556
Lead Channel Impedance Value: 295.556
Lead Channel Impedance Value: 456 Ohm
Lead Channel Impedance Value: 532 Ohm
Lead Channel Impedance Value: 532 Ohm
Lead Channel Impedance Value: 589 Ohm
Lead Channel Impedance Value: 665 Ohm
Lead Channel Impedance Value: 665 Ohm
Lead Channel Impedance Value: 779 Ohm
Lead Channel Impedance Value: 836 Ohm
Lead Channel Impedance Value: 836 Ohm
Lead Channel Impedance Value: 874 Ohm
Lead Channel Impedance Value: 893 Ohm
Lead Channel Impedance Value: 988 Ohm
Lead Channel Pacing Threshold Amplitude: 0.375 V
Lead Channel Pacing Threshold Amplitude: 0.875 V
Lead Channel Pacing Threshold Amplitude: 0.875 V
Lead Channel Pacing Threshold Pulse Width: 0.4 ms
Lead Channel Pacing Threshold Pulse Width: 0.4 ms
Lead Channel Pacing Threshold Pulse Width: 0.4 ms
Lead Channel Sensing Intrinsic Amplitude: 4.75 mV
Lead Channel Sensing Intrinsic Amplitude: 4.75 mV
Lead Channel Sensing Intrinsic Amplitude: 7.625 mV
Lead Channel Sensing Intrinsic Amplitude: 7.625 mV
Lead Channel Setting Pacing Amplitude: 1.5 V
Lead Channel Setting Pacing Amplitude: 1.5 V
Lead Channel Setting Pacing Amplitude: 2 V
Lead Channel Setting Pacing Pulse Width: 0.4 ms
Lead Channel Setting Pacing Pulse Width: 0.4 ms
Lead Channel Setting Sensing Sensitivity: 0.3 mV

## 2020-08-16 ENCOUNTER — Ambulatory Visit (INDEPENDENT_AMBULATORY_CARE_PROVIDER_SITE_OTHER): Payer: Medicaid Other

## 2020-08-16 DIAGNOSIS — Z95810 Presence of automatic (implantable) cardiac defibrillator: Secondary | ICD-10-CM

## 2020-08-16 DIAGNOSIS — I5022 Chronic systolic (congestive) heart failure: Secondary | ICD-10-CM | POA: Diagnosis not present

## 2020-08-19 ENCOUNTER — Telehealth: Payer: Self-pay

## 2020-08-19 NOTE — Telephone Encounter (Signed)
I spoke with the patient and he agreed to send transmission tonight.

## 2020-08-20 ENCOUNTER — Ambulatory Visit: Payer: Medicaid Other | Admitting: Gastroenterology

## 2020-08-20 NOTE — Progress Notes (Signed)
EPIC Encounter for ICM Monitoring  Patient Name: Gary Kirk is a 55 y.o. male Date: 08/20/2020 Primary Care Physican: Neale Burly, MD Primary Cardiologist: Dayna Ramus, NP Electrophysiologist: Audry Riles Pacing: 98.8%  08/20/2020 Weight: 347 lbs        1st ICM Remote Transmission.  Heart Failure questions reviewed.  Pt asymptomatic.     Optivol thoracic impedance normal but was suggesting possible fluid accumulation from 07/29/20 - 08/08/20.   Prescribed: Furosemide 20 mg take 1 tablet daily.  Recommendations:   Encouraged to call if experiencing fluid symptoms.  Follow-up plan: ICM clinic phone appointment on 09/27/2020.   91 day device clinic remote transmission 11/12/2020.    EP/Cardiology Office Visits: 09/28/2020 with Dr. Harl Bowie.    Copy of ICM check sent to Dr. Rayann Heman.   3 month ICM trend: 08/19/2020.    1 Year ICM trend:       Rosalene Billings, RN 08/20/2020 11:15 AM

## 2020-09-02 NOTE — Progress Notes (Signed)
Remote pacemaker transmission.   

## 2020-09-14 NOTE — H&P (Signed)
Surgical History & Physical  Patient Name: Gary Kirk DOB: 1965/05/05  Surgery: Cataract extraction with intraocular lens implant phacoemulsification; Left Eye  Surgeon: Baruch Goldmann MD Surgery Date:  09/20/2020 Pre-Op Date:  08/26/2020  HPI: A 12 Yr. old male patient Pt referred by Dr. Hassell Done for cataract evaluation. The patient complains of nighttime light - car headlights, street lamps etc. glare causing poor vision, which began 5 years ago. Both eyes are affected. OD>OS. The episode is gradual. The condition's severity is worsening. The complaint is associated with blurry vision and glare. Pt states light has become blinding. Pt has d/c night and daytime driving due to vision. Symptoms are negatively affecting pt's quality of life. Pt denies any eye drop use. Pt denies any eye pain or floaters. LBS-does not check A1C%- pt unaware  Medical History: Cataracts VF defects Anxiety disorder Acid reflux Obstructive Lung Di... Diabetes Heart Problem Lung Problems Thyroid Problems  Review of Systems Negative Allergic/Immunologic Negative Cardiovascular Negative Constitutional Negative Ear, Nose, Mouth & Throat Negative Endocrine Negative Eyes Negative Gastrointestinal Negative Genitourinary Negative Hemotologic/Lymphatic Negative Integumentary Negative Musculoskeletal Negative Neurological Negative Psychiatry Negative Respiratory  Social   Former smoker   Medication AMOS LEVOTHYROXINE SODIUM, FUROSEMIDE, SERTRALINE HCL, GABAPENTIN, PROAIR HFA INH, CARVEDILOL, SYMBICORT, SPIRIVA HANDIHALER, ELIQUIS, FAMOTIDINE, LEVOTHYROXINE,   Sx/Procedures Cardiac pacemaker,   Drug Allergies  Tramadol, Abilify, Ibprofen,   History & Physical: Heent:  Cataract, Left eye NECK: supple without bruits LUNGS: lungs clear to auscultation CV: regular rate and rhythm Abdomen: soft and non-tender  Impression & Plan: Assessment: 1.  COMBINED FORMS AGE RELATED CATARACT; Both Eyes  (H25.813) 2.  BLEPHARITIS; Right Upper Lid, Right Lower Lid, Left Upper Lid, Left Lower Lid (H01.001, H01.002,H01.004,H01.005) 3.  BLINDNESS RIGHT EYE CATEGORY 4, NORMAL VISION LEFT EYE (H54.414A) 4.  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  OAG BORDERLINE FINDINGS LOW RISK; Both Eyes (H40.013)  Plan: 1.  Cataract accounts for the patient's decreased vision. This visual impairment is not correctable with a tolerable change in glasses or contact lenses. Cataract surgery with an implantation of a new lens should significantly improve the visual and functional status of the patient. Discussed all risks, benefits, alternatives, and potential complications. Discussed the procedures and recovery. Patient desires to have surgery. A-scan ordered and performed today for intra-ocular lens calculations. The surgery will be performed in order to improve vision for driving, reading, and for eye examinations. Recommend phacoemulsification with intra-ocular lens. Recommend Dextenza for post-operative pain and inflammation. Left Eye. only. Dilates well - shugarcaine by protocol. Declines toric.  2.  Recommend regular lid cleaning.  3.  Monocular precautions discussed, including wearing shatterproof lenses.  4.  Presumed. About 5 years ago. No symptoms since. Stroke warning signs reviewed.  5.  Small papilloma. Observe for now.  6.  Based on cup-to-disc ratio. Negative Family history. IOPs excellent. OCT rNFL shows: thinning OU - likely unreliable secondary to cataracts. Detailed discussion about glaucoma today including importance of maintaining good follow up and following treatment plan, and the possibility of irreversible blindness as part of this disease process.

## 2020-09-15 ENCOUNTER — Encounter (HOSPITAL_COMMUNITY): Payer: Self-pay

## 2020-09-15 ENCOUNTER — Other Ambulatory Visit: Payer: Self-pay

## 2020-09-15 ENCOUNTER — Encounter (HOSPITAL_COMMUNITY)
Admission: RE | Admit: 2020-09-15 | Discharge: 2020-09-15 | Disposition: A | Discharge status: Home / Self Care | Payer: 59 | Source: Ambulatory Visit | Attending: Ophthalmology | Admitting: Ophthalmology

## 2020-09-20 ENCOUNTER — Ambulatory Visit (HOSPITAL_COMMUNITY): Payer: Medicaid Other | Admitting: Anesthesiology

## 2020-09-20 ENCOUNTER — Ambulatory Visit (HOSPITAL_COMMUNITY)
Admission: RE | Admit: 2020-09-20 | Discharge: 2020-09-20 | Disposition: A | Discharge status: Home / Self Care | Payer: Medicaid Other | Attending: Ophthalmology | Admitting: Ophthalmology

## 2020-09-20 ENCOUNTER — Encounter (HOSPITAL_COMMUNITY)
Admission: RE | Disposition: A | Discharge status: Home / Self Care | Payer: Self-pay | Source: Home / Self Care | Attending: Ophthalmology

## 2020-09-20 DIAGNOSIS — H0100B Unspecified blepharitis left eye, upper and lower eyelids: Secondary | ICD-10-CM | POA: Diagnosis not present

## 2020-09-20 DIAGNOSIS — Z885 Allergy status to narcotic agent status: Secondary | ICD-10-CM | POA: Diagnosis not present

## 2020-09-20 DIAGNOSIS — H3411 Central retinal artery occlusion, right eye: Secondary | ICD-10-CM | POA: Diagnosis not present

## 2020-09-20 DIAGNOSIS — Z7901 Long term (current) use of anticoagulants: Secondary | ICD-10-CM | POA: Insufficient documentation

## 2020-09-20 DIAGNOSIS — H0100A Unspecified blepharitis right eye, upper and lower eyelids: Secondary | ICD-10-CM | POA: Insufficient documentation

## 2020-09-20 DIAGNOSIS — Z79899 Other long term (current) drug therapy: Secondary | ICD-10-CM | POA: Insufficient documentation

## 2020-09-20 DIAGNOSIS — Z888 Allergy status to other drugs, medicaments and biological substances status: Secondary | ICD-10-CM | POA: Diagnosis not present

## 2020-09-20 DIAGNOSIS — D23112 Other benign neoplasm of skin of right lower eyelid, including canthus: Secondary | ICD-10-CM | POA: Insufficient documentation

## 2020-09-20 DIAGNOSIS — Z87891 Personal history of nicotine dependence: Secondary | ICD-10-CM | POA: Diagnosis not present

## 2020-09-20 DIAGNOSIS — Z7951 Long term (current) use of inhaled steroids: Secondary | ICD-10-CM | POA: Insufficient documentation

## 2020-09-20 DIAGNOSIS — H54414A Blindness right eye category 4, normal vision left eye: Secondary | ICD-10-CM | POA: Diagnosis not present

## 2020-09-20 DIAGNOSIS — E1136 Type 2 diabetes mellitus with diabetic cataract: Secondary | ICD-10-CM | POA: Diagnosis present

## 2020-09-20 DIAGNOSIS — H2512 Age-related nuclear cataract, left eye: Secondary | ICD-10-CM | POA: Diagnosis not present

## 2020-09-20 HISTORY — PX: CATARACT EXTRACTION W/PHACO: SHX586

## 2020-09-20 SURGERY — PHACOEMULSIFICATION, CATARACT, WITH IOL INSERTION
Anesthesia: Monitor Anesthesia Care | Site: Eye | Laterality: Left

## 2020-09-20 MED ORDER — MIDAZOLAM HCL 2 MG/2ML IJ SOLN
INTRAMUSCULAR | Status: AC
  Filled 2020-09-20: qty 2

## 2020-09-20 MED ORDER — BSS IO SOLN
INTRAOCULAR | Status: DC | PRN
  Administered 2020-09-20: 15 mL via INTRAOCULAR

## 2020-09-20 MED ORDER — POVIDONE-IODINE 5 % OP SOLN
OPHTHALMIC | Status: DC | PRN
  Administered 2020-09-20: 1 via OPHTHALMIC

## 2020-09-20 MED ORDER — EPINEPHRINE PF 1 MG/ML IJ SOLN
INTRAMUSCULAR | Status: AC
  Filled 2020-09-20: qty 2

## 2020-09-20 MED ORDER — TROPICAMIDE 1 % OP SOLN
1.0000 [drp] | OPHTHALMIC | Status: AC
  Administered 2020-09-20 (×3): 1 [drp] via OPHTHALMIC

## 2020-09-20 MED ORDER — MIDAZOLAM HCL 2 MG/2ML IJ SOLN
INTRAMUSCULAR | Status: DC | PRN
  Administered 2020-09-20: 2 mg via INTRAVENOUS

## 2020-09-20 MED ORDER — SODIUM HYALURONATE 23MG/ML IO SOSY
PREFILLED_SYRINGE | INTRAOCULAR | Status: DC | PRN
  Administered 2020-09-20: 0.6 mL via INTRAOCULAR

## 2020-09-20 MED ORDER — SODIUM HYALURONATE 10 MG/ML IO SOLUTION
PREFILLED_SYRINGE | INTRAOCULAR | Status: DC | PRN
  Administered 2020-09-20: 0.85 mL via INTRAOCULAR

## 2020-09-20 MED ORDER — LIDOCAINE HCL 3.5 % OP GEL
1.0000 "application " | Freq: Once | OPHTHALMIC | Status: AC
  Administered 2020-09-20: 1 via OPHTHALMIC

## 2020-09-20 MED ORDER — NEOMYCIN-POLYMYXIN-DEXAMETH 3.5-10000-0.1 OP SUSP
OPHTHALMIC | Status: DC | PRN
  Administered 2020-09-20: 1 [drp] via OPHTHALMIC

## 2020-09-20 MED ORDER — LIDOCAINE HCL (PF) 1 % IJ SOLN
INTRAOCULAR | Status: DC | PRN
  Administered 2020-09-20: 1 mL via OPHTHALMIC

## 2020-09-20 MED ORDER — EPINEPHRINE PF 1 MG/ML IJ SOLN
INTRAOCULAR | Status: DC | PRN
  Administered 2020-09-20: 500 mL

## 2020-09-20 MED ORDER — PHENYLEPHRINE HCL 2.5 % OP SOLN
1.0000 [drp] | OPHTHALMIC | Status: AC | PRN
  Administered 2020-09-20 (×3): 1 [drp] via OPHTHALMIC

## 2020-09-20 MED ORDER — SODIUM CHLORIDE 0.9% FLUSH
10.0000 mL | INTRAVENOUS | Status: DC | PRN
  Administered 2020-09-20: 3 mL via INTRAVENOUS

## 2020-09-20 MED ORDER — TETRACAINE HCL 0.5 % OP SOLN
1.0000 [drp] | OPHTHALMIC | Status: AC | PRN
  Administered 2020-09-20 (×3): 1 [drp] via OPHTHALMIC

## 2020-09-20 MED ORDER — STERILE WATER FOR IRRIGATION IR SOLN
Status: DC | PRN
  Administered 2020-09-20: 250 mL

## 2020-09-20 SURGICAL SUPPLY — 11 items
CLOTH BEACON ORANGE TIMEOUT ST (SAFETY) ×1 IMPLANT
EYE SHIELD UNIVERSAL CLEAR (GAUZE/BANDAGES/DRESSINGS) ×1 IMPLANT
GLOVE SURG UNDER POLY LF SZ7 (GLOVE) ×2 IMPLANT
NDL HYPO 18GX1.5 BLUNT FILL (NEEDLE) IMPLANT
NEEDLE HYPO 18GX1.5 BLUNT FILL (NEEDLE) ×2 IMPLANT
PAD ARMBOARD 7.5X6 YLW CONV (MISCELLANEOUS) ×1 IMPLANT
SYR TB 1ML LL NO SAFETY (SYRINGE) ×1 IMPLANT
TAPE SURG TRANSPORE 1 IN (GAUZE/BANDAGES/DRESSINGS) IMPLANT
TAPE SURGICAL TRANSPORE 1 IN (GAUZE/BANDAGES/DRESSINGS) ×2
TECHNIS 1-PIECE IOL (Intraocular Lens) ×1 IMPLANT
WATER STERILE IRR 250ML POUR (IV SOLUTION) ×1 IMPLANT

## 2020-09-20 NOTE — Discharge Instructions (Addendum)
Please discharge patient when stable, will follow up today with Dr. Wrzosek at the [facility] [place] office immediately following discharge.  Leave shield in place until visit.  All paperwork with discharge instructions will be given at the office.  [facility] [place] Address:  [address]  [place], [place] [address]  

## 2020-09-20 NOTE — Anesthesia Procedure Notes (Signed)
Procedure Name: MAC Date/Time: 09/20/2020 11:27 AM Performed by: Vista Deck, CRNA Pre-anesthesia Checklist: Patient identified, Emergency Drugs available, Suction available, Timeout performed and Patient being monitored Patient Re-evaluated:Patient Re-evaluated prior to induction Oxygen Delivery Method: Nasal Cannula

## 2020-09-20 NOTE — Anesthesia Postprocedure Evaluation (Signed)
Anesthesia Post Note  Patient: Gary Kirk  Procedure(s) Performed: CATARACT EXTRACTION PHACO AND INTRAOCULAR LENS PLACEMENT (IOC) (Left: Eye)  Patient location during evaluation: Phase II Anesthesia Type: MAC Level of consciousness: awake and alert and oriented Pain management: pain level controlled Vital Signs Assessment: post-procedure vital signs reviewed and stable Respiratory status: spontaneous breathing and respiratory function stable Cardiovascular status: blood pressure returned to baseline and stable Postop Assessment: no apparent nausea or vomiting Anesthetic complications: no   No notable events documented.   Last Vitals:  Vitals:   09/20/20 1045 09/20/20 1147  BP: 126/77 135/87  Pulse: 92   Resp: 13 19  Temp:  36.7 C  SpO2: 98% 97%    Last Pain:  Vitals:   09/20/20 1147  TempSrc: Oral  PainSc: 0-No pain                 Damarea Merkel C Necole Minassian

## 2020-09-20 NOTE — Transfer of Care (Signed)
Immediate Anesthesia Transfer of Care Note  Patient: Gary Kirk  Procedure(s) Performed: CATARACT EXTRACTION PHACO AND INTRAOCULAR LENS PLACEMENT (IOC) (Left: Eye)  Patient Location: Short Stay  Anesthesia Type:MAC  Level of Consciousness: awake and patient cooperative  Airway & Oxygen Therapy: Patient Spontanous Breathing  Post-op Assessment: Report given to RN and Post -op Vital signs reviewed and stable  Post vital signs: Reviewed and stable  Last Vitals:  Vitals Value Taken Time  BP 135/87 09/20/20 1147  Temp 36.7 C 09/20/20 1147  Pulse    Resp 19 09/20/20 1147  SpO2 97 % 09/20/20 1147    Last Pain:  Vitals:   09/20/20 1147  TempSrc: Oral  PainSc: 0-No pain         Complications: No notable events documented.

## 2020-09-20 NOTE — Op Note (Signed)
Date of procedure: 09/20/20  Pre-operative diagnosis: Visually significant age-related nuclear cataract, Left Eye (H25.12)  Post-operative diagnosis: Visually significant age-related nuclear cataract, Left Eye  Procedure: Removal of cataract via phacoemulsification and insertion of intra-ocular lens Wynetta Emery and [place]  +24.5D into the capsular bag of the Left Eye  Attending surgeon: Gerda Diss. Corey Caulfield, MD, MA  Anesthesia: MAC, Topical [place]  Complications: None  Estimated Blood Loss: <36m (minimal)  Specimens: None  Implants: As above  Indications:  Visually significant age-related cataract, Left Eye  Procedure:  The patient was seen and identified in the pre-operative area. The operative eye was identified and dilated.  The operative eye was marked.  Topical anesthesia was administered to the operative eye.     The patient was then to the operative suite and placed in the supine position.  A timeout was performed confirming the patient, procedure to be performed, and all other relevant information.   The patient's face was prepped and draped in the usual fashion for intra-ocular surgery.  A lid speculum was placed into the operative eye and the surgical microscope moved into place and focused.  An inferotemporal paracentesis was created using a 20 gauge paracentesis blade.  Shugarcaine was injected into the anterior chamber.  Viscoelastic was injected into the anterior chamber.  A temporal clear-corneal main wound incision was created using a 2.426mmicrokeratome.  A continuous curvilinear capsulorrhexis was initiated using an irrigating cystitome and completed using capsulorrhexis forceps.  Hydrodissection and hydrodeliniation were performed.  Viscoelastic was injected into the anterior chamber.  A phacoemulsification handpiece and a chopper as a second instrument were used to remove the nucleus and epinucleus. The irrigation/aspiration handpiece was used to remove any remaining  cortical material.   The capsular bag was reinflated with viscoelastic, checked, and found to be intact.  The intraocular lens was inserted into the capsular bag.  The irrigation/aspiration handpiece was used to remove any remaining viscoelastic.  The clear corneal wound and paracentesis wounds were then hydrated and checked with Weck-Cels to be watertight.  The lid-speculum and drape was removed, and the patient's face was cleaned with a wet and dry 4x4.  Maxitrol was instilled in the eye before a clear shield was taped over the eye. The patient was taken to the post-operative care unit in good condition, having tolerated the procedure well.  Post-Op Instructions: [facility] and will receive all other orders and instructions.

## 2020-09-20 NOTE — Anesthesia Preprocedure Evaluation (Signed)
Anesthesia Evaluation  Patient identified by MRN, date of birth, ID band Patient awake    Reviewed: Allergy & Precautions, NPO status , Patient's Chart, lab work & pertinent test results  History of Anesthesia Complications Negative for: history of anesthetic complications  Airway Mallampati: III  TM Distance: >3 FB Neck ROM: Full    Dental  (+) Edentulous Upper, Edentulous Lower   Pulmonary shortness of breath, with exertion and lying, sleep apnea , pneumonia, COPD,  COPD inhaler, Patient abstained from smoking., former smoker,    Pulmonary exam normal breath sounds clear to auscultation       Cardiovascular Exercise Tolerance: Poor hypertension, Pt. on medications + CAD and +CHF  + dysrhythmias (prolonged QT) Atrial Fibrillation + Cardiac Defibrillator  Rhythm:Irregular Rate:Abnormal  1. Left ventricular ejection fraction, by visual estimation, is 55 to 60%. The left ventricle has normal function. There is no left ventricular hypertrophy.  2. The left ventricle demonstrates global hypokinesis.  3. Global right ventricle has normal systolic function.The right ventricular size is normal. No increase in right ventricular wall thickness.  4. Left atrial size was normal.  5. Right atrial size was normal.  6. The mitral valve is normal in structure. Trivial mitral valve regurgitation.  7. The tricuspid valve is grossly normal.  8. The aortic valve is tricuspid. Aortic valve regurgitation is not visualized. No evidence of aortic valve sclerosis or stenosis.  9. The pulmonic valve was not well visualized. Pulmonic valve regurgitation is not visualized.  10. Aortic dilatation noted.  11. There is mild dilatation of the ascending aorta measuring 39 mm.  12. A pacer wire is visualized in the RA and RV.  13. The inferior vena cava is normal in size with greater than 50% respiratory variability, suggesting right atrial pressure of 3  mmHg.    Neuro/Psych PSYCHIATRIC DISORDERS Depression    GI/Hepatic negative GI ROS, (+)     substance abuse  marijuana use, Hepatitis -  Endo/Other  Hypothyroidism Morbid obesity  Renal/GU Renal InsufficiencyRenal disease     Musculoskeletal   Abdominal   Peds  Hematology   Anesthesia Other Findings   Reproductive/Obstetrics                             Anesthesia Physical Anesthesia Plan  ASA: 4  Anesthesia Plan: MAC   Post-op Pain Management:    Induction:   PONV Risk Score and Plan:   Airway Management Planned: Nasal Cannula and Natural Airway  Additional Equipment:   Intra-op Plan:   Post-operative Plan:   Informed Consent: I have reviewed the patients History and Physical, chart, labs and discussed the procedure including the risks, benefits and alternatives for the proposed anesthesia with the patient or authorized representative who has indicated his/her understanding and acceptance.     Dental advisory given  Plan Discussed with: CRNA and Surgeon  Anesthesia Plan Comments:         Anesthesia Quick Evaluation

## 2020-09-20 NOTE — Interval H&P Note (Signed)
History and Physical Interval Note:  09/20/2020 11:21 AM  Gary Kirk  has presented today for surgery, with the diagnosis of Nuclear sclerotic cataract - Left eye.  The various methods of treatment have been discussed with the patient and family. After consideration of risks, benefits and other options for treatment, the patient has consented to  Procedure(s) with comments: CATARACT EXTRACTION PHACO AND INTRAOCULAR LENS PLACEMENT ([place]) (Left) - left as a surgical intervention.  The patient's history has been reviewed, patient examined, no change in status, stable for surgery.  I have reviewed the patient's chart and labs.  Questions were answered to the patient's satisfaction.     Baruch Goldmann

## 2020-09-21 ENCOUNTER — Encounter (HOSPITAL_COMMUNITY): Payer: Self-pay | Admitting: Ophthalmology

## 2020-09-27 ENCOUNTER — Ambulatory Visit (INDEPENDENT_AMBULATORY_CARE_PROVIDER_SITE_OTHER): Payer: Medicaid Other

## 2020-09-27 ENCOUNTER — Telehealth: Payer: Self-pay

## 2020-09-27 DIAGNOSIS — Z95810 Presence of automatic (implantable) cardiac defibrillator: Secondary | ICD-10-CM

## 2020-09-27 DIAGNOSIS — I5022 Chronic systolic (congestive) heart failure: Secondary | ICD-10-CM | POA: Diagnosis not present

## 2020-09-27 NOTE — Telephone Encounter (Signed)
-----   Message from Rosalene Billings, RN sent at 09/27/2020  4:38 PM EDT ----- Regarding: AT/AF Could you please review 8/1 [facility] report?    It says Time in AT/AF 9.0 hr/day (37.6%).  BiV Pacing decreased since 08/19/2020 report.   Med list does not show Eliquis.     Pt has OV appt with Dr Harl Bowie, tomorrow 8/2 at 4:00 PM if you need to send to him for update as well as Dr Rayann Heman.     This is copied from Dr Bonita Quin May note (see below)  3. Afib chads2vasc sore is 2.  He is on eliquis Prior notes describe AF as "permanent" though burden is very low by device interrogation today Per Dr Aquilla Hacker notes, he has had prior inappropriate ICD therapy due to AF with RVR   Thank you! Margarita Grizzle

## 2020-09-27 NOTE — Telephone Encounter (Signed)
Spoke with pt.  He reports that he IS taking Eliquis, it appears it was discontinued after recent cataract surgery in error.    Pt reports that he has been feeling fatigued, but denies any other cardiac symptoms.    Given patient history of known AF, anticipate continued monitoring.

## 2020-09-27 NOTE — Progress Notes (Signed)
EPIC Encounter for ICM Monitoring  Patient Name: FRANDY EDMISON is a 55 y.o. male Date: 09/27/2020 Primary Care Physican: Neale Burly, MD Primary Cardiologist: Dayna Ramus, NP Electrophysiologist: Audry Riles Pacing: 86.0% (98.8% on 08/19/20 report)      08/20/2020 Weight: 347 lbs  Since 08/19/2020                                                              1st ICM Remote Transmission.  Heart Failure questions reviewed.  Pt asymptomatic.  Pt reports taking Eliquis twice a day but thinks it may have been discontinued by mistake in Epic system.  He will bring the bottle to Dr Nelly Laurence office tomorrow to update his med list.    Optivol thoracic impedance normal.  Message sent to Device clinic triage on 8/1 to review report for decreased BiV pacing and AT/AF.   Prescribed: Furosemide 20 mg take 1 tablet daily.   Recommendations:   Encouraged to call if experiencing fluid symptoms.   Follow-up plan: ICM clinic phone appointment on 11/02/2020.  91 day device clinic remote transmission 11/12/2020.     EP/Cardiology Office Visits: 09/28/2020 with Dr. Harl Bowie.     Copy of ICM check sent to Dr. Rayann Heman.   3 month ICM trend: 09/27/2020.    1 Year ICM trend:       Rosalene Billings, RN 09/27/2020 4:31 PM

## 2020-09-27 NOTE — Telephone Encounter (Signed)
Spoke with patient.  Requested to send remote transmission to check fluid levels before cardiology visit tomorrow.  He will send a transmission in the next couple of hours.

## 2020-09-28 ENCOUNTER — Encounter: Payer: Self-pay | Admitting: Cardiology

## 2020-09-28 ENCOUNTER — Ambulatory Visit (INDEPENDENT_AMBULATORY_CARE_PROVIDER_SITE_OTHER): Payer: Medicaid Other | Admitting: Cardiology

## 2020-09-28 VITALS — BP 140/88 | HR 83 | Ht 72.0 in | Wt 353.0 lb

## 2020-09-28 DIAGNOSIS — I5022 Chronic systolic (congestive) heart failure: Secondary | ICD-10-CM

## 2020-09-28 DIAGNOSIS — I10 Essential (primary) hypertension: Secondary | ICD-10-CM

## 2020-09-28 DIAGNOSIS — I480 Paroxysmal atrial fibrillation: Secondary | ICD-10-CM

## 2020-09-28 NOTE — Progress Notes (Signed)
Clinical Summary Gary Kirk is a 55 y.o.male  1.Chronic systolic HF - prior severe systolic dysfunction as low as 15%. Most recent echos have shown normalized LVEF.Notes describe a NICM Jan 2021 echo: LVEF 55-60% -AICD followed by Gary Kirk  - no recent edema. Occasional SOB worst with hot weather, mild wheezing.      2. Afib - no recent palpitatins - compliant with meds - no bleeding on eliquis    3. HTN - he is compliant with meds    4. OSA -not compliant with cpap, reports not able to wear comfortable  Past Medical History:  Diagnosis Date   AICD (automatic cardioverter/defibrillator) present    a. 10/2013 s/p MDT [id number] BiV ICD (ser# [id number]).   COPD (chronic obstructive pulmonary disease) ([place])    a. Quit smoking in 2015.   HFrEF (heart failure with reduced ejection fraction) ([place])    a. 02/2012 Echo: EF "depressed";  b. 03/2013 Echo: Ef 15%, mod to sev LV dil w/ anteroseptal AK and sev glob HK. Dilated RV w/ reduced fxn. Mild to mod RAE, Sev LAE. Mild TR; c. 11/2013 Echo: EF 10-15%, redcued RV fxn, mild BAE, mild TR.   History of ileus    Hypertension    Hypokalemia    Hypothyroidism    Major depression    NICM (nonischemic cardiomyopathy) ([place])    a. 02/2012 Echo: depressed EF; b. 02/2012 MV: EF 38%, small fixed apical defect, no ischemia; c. 03/2013 Echo: EF 15%; d. 08/2013 MV: apical ant, septal inf fixed defects, no ischemia; e. 10/2013 s/p MDT [id number] BiV ICD; d. 11/2013 Cath: results not avail, reportedly nl cors; e. 09/2014 MV: Fixed inf defect consistent w/ diaphragmatic atten. No ischemia. EF 28%.   Persistent atrial fibrillation (HCC)    a. CHA2DS2VASc = 2-->Eliquis.   Sleep apnea    a. Doesn't tolerate CPAP.     Allergies  Allergen Reactions   Aripiprazole Other (See Comments)    Right arm shaking    Tramadol Shortness Of Breath and Nausea Only    Pt states he has "flu type symptoms" after receiving tramadol    Ibuprofen Other (See  Comments)    "Bleeding ulcers" per pt Spits up blood      Current Outpatient Medications  Medication Sig Dispense Refill   acetaminophen (TYLENOL) 500 MG tablet Take 1,000 mg by mouth every 6 (six) hours as needed for moderate pain or headache.     carvedilol (COREG) 12.5 MG tablet Take 1 tablet (12.5 mg total) by mouth 2 (two) times daily with a meal. 60 tablet 0   famotidine (PEPCID) 40 MG tablet Take 40 mg by mouth daily.     furosemide (LASIX) 20 MG tablet Take 1 tablet (20 mg total) by mouth daily.     gabapentin (NEURONTIN) 300 MG capsule Take 300 mg by mouth 3 (three) times daily.      levothyroxine (SYNTHROID) 112 MCG tablet Take 112 mcg by mouth daily before breakfast.     PROAIR HFA 108 (90 Base) MCG/ACT inhaler Inhale 2 puffs into the lungs every 4 (four) hours as needed for wheezing or shortness of breath.   3   sertraline (ZOLOFT) 50 MG tablet Take 50 mg by mouth daily.     SPIRIVA HANDIHALER 18 MCG inhalation capsule Place 18 mcg into inhaler and inhale daily.     SYMBICORT 160-4.5 MCG/ACT inhaler Inhale 2 puffs into the lungs in the morning and at bedtime.  No current facility-administered medications for this visit.     Past Surgical History:  Procedure Laterality Date   BIV ICD GENERATOR CHANGEOUT N/A 07/21/2019   Procedure: BIV ICD GENERATOR CHANGEOUT;  Surgeon: Gary Sprang, MD;  Location: [facility];  Service: Cardiovascular;  Laterality: N/A;   CARDIAC DEFIBRILLATOR PLACEMENT     CATARACT EXTRACTION W/PHACO Left 09/20/2020   Procedure: CATARACT EXTRACTION PHACO AND INTRAOCULAR LENS PLACEMENT (IOC);  Surgeon: Gary Goldmann, MD;  Location: [facility];  Service: Ophthalmology;  Laterality: Left;  CDE   6.21   PACEMAKER IMPLANT     SHOULDER DEBRIDEMENT Left      Allergies  Allergen Reactions   Aripiprazole Other (See Comments)    Right arm shaking    Tramadol Shortness Of Breath and Nausea Only    Pt states he has "flu type symptoms" after receiving  tramadol    Ibuprofen Other (See Comments)    "Bleeding ulcers" per pt Spits up blood       Family History  Problem Relation Age of Onset   COPD Mother        decsd 2008   Hypertension Mother    Atrial fibrillation Mother    Heart failure Mother    Cancer Father        unknown cancer, died when patient was 14   Lung cancer Brother    Atrial fibrillation Brother      Social History Gary Kirk reports that he quit smoking about 6 years ago. His smoking use included cigarettes. He has a 60.00 pack-year smoking history. He has never used smokeless tobacco. Gary Kirk reports no history of alcohol use.   Review of Systems CONSTITUTIONAL: No weight loss, fever, chills, weakness or fatigue.  HEENT: Eyes: No visual loss, blurred vision, double vision or yellow sclerae.No hearing loss, sneezing, congestion, runny nose or sore throat.  SKIN: No rash or itching.  CARDIOVASCULAR: per hpi RESPIRATORY: No shortness of breath, cough or sputum.  GASTROINTESTINAL: No anorexia, nausea, vomiting or diarrhea. No abdominal pain or blood.  GENITOURINARY: No burning on urination, no polyuria NEUROLOGICAL: No headache, dizziness, syncope, paralysis, ataxia, numbness or tingling in the extremities. No change in bowel or bladder control.  MUSCULOSKELETAL: No muscle, back pain, joint pain or stiffness.  LYMPHATICS: No enlarged nodes. No history of splenectomy.  PSYCHIATRIC: No history of depression or anxiety.  ENDOCRINOLOGIC: No reports of sweating, cold or heat intolerance. No polyuria or polydipsia.  Marland Kitchen   Physical Examination Today's Vitals   09/28/20 1610  BP: 140/88  Pulse: 83  SpO2: 96%  Weight: (!) 353 lb (160.1 kg)  Height: 6' (1.829 m)   Body mass index is 47.88 kg/m.  Gen: resting comfortably, no acute distress HEENT: no scleral icterus, pupils equal round and reactive, no palptable cervical adenopathy,  CV: RRR, no m/r/g no jvd Resp: Clear to auscultation bilaterally GI:  abdomen is soft, non-tender, non-distended, normal bowel sounds, no hepatosplenomegaly MSK: extremities are warm, no edema.  Skin: warm, no rash Neuro:  no focal deficits Psych: appropriate affect     Assessment and Plan  Chronic systolic HF -LVEF has normalized, no recent symptoms - continue current meds  2. PAF - some afib noted on recent device check, he denies symptoms - continue current meds including eliquis  3. HTN - manual recheck 134/78, essentially at goal. Continue current meds  F/u 6 months      Arnoldo Lenis, M.D.

## 2020-09-28 NOTE — Patient Instructions (Signed)
Medication Instructions:  Continue all current medications.   Labwork: none  Testing/Procedures: none  Follow-Up: 6 months   Any Other Special Instructions Will Be Listed Below (If Applicable).   If you need a refill on your cardiac medications before your next appointment, please call your pharmacy.  

## 2020-09-29 ENCOUNTER — Other Ambulatory Visit (HOSPITAL_COMMUNITY): Payer: 59

## 2020-10-02 NOTE — Telephone Encounter (Signed)
Addressed by Dr Harl Bowie 09/28/20.  His note reviewed and suggests that patient is asymptomatic

## 2020-10-06 ENCOUNTER — Encounter: Payer: Self-pay | Admitting: Internal Medicine

## 2020-11-02 ENCOUNTER — Ambulatory Visit (INDEPENDENT_AMBULATORY_CARE_PROVIDER_SITE_OTHER): Payer: Medicaid Other

## 2020-11-02 DIAGNOSIS — I5022 Chronic systolic (congestive) heart failure: Secondary | ICD-10-CM

## 2020-11-02 DIAGNOSIS — Z95810 Presence of automatic (implantable) cardiac defibrillator: Secondary | ICD-10-CM | POA: Diagnosis not present

## 2020-11-04 ENCOUNTER — Telehealth: Payer: Self-pay

## 2020-11-04 NOTE — Telephone Encounter (Signed)
I spoke with the patient about missed ICM transmission and he agreed to send one today.

## 2020-11-05 NOTE — Progress Notes (Signed)
EPIC Encounter for ICM Monitoring  Patient Name: Gary Kirk is a 55 y.o. male Date: 11/05/2020 Primary Care Physican: Neale Burly, MD Primary Cardiologist: Dayna Ramus, NP Electrophysiologist: Audry Riles Pacing: 72.6 % (86.0% on 8/1 report and 98.8% on 08/19/20 report)      11/05/2020 Weight: 347 lbs  Since 27-Sep-2020 AT/AF Burden has increased from 37.6% (8/1 report) to 100%           Spoke with patient and heart failure questions reviewed.  Pt denies fluid symptoms but does report significant fatigue since about mid July which correlates with report suggesting change in rhythm.          He sleeps most of the day and has not been taking Furosemide consistently for the last week which correlates with decreased impedance.    Optivol thoracic impedance possible ongoing fluid accumulation starting 10/27/2020.  Message sent to Device clinic triage 9/9 to review further decrease in BiV Pacing and increase in AT/AF Burden.     Prescribed: Furosemide 20 mg take 1 tablet daily.   Labs: 10/22/2020 Creatinine 1.44, BUN 22, Potassium 3.1, Sodium 137, GFR 55->60 07/16/2020 Creatinine 1.10, BUN 10, Potassium 3.7, Sodium 139, GFR 76-88  A complete set of results can be found in Results Review.  Recommendations: Advised patient to take Furosemide daily as prescribed and limit salt intake.  Copy sent to Dr Harl Bowie for review and device clinic to discuss with Dr Rayann Heman if needed.    Follow-up plan: ICM clinic phone appointment on 11/09/2020 (manual) to recheck fluid levels.  91 day device clinic remote transmission 11/12/2020.     EP/Cardiology Office Visits:  04/07/2021 with Dr. Harl Bowie.  07/01/2021 with Dr Rayann Heman.   Copy of ICM check sent to Dr. Rayann Heman.    3 month ICM trend: 11/04/2020.    1 Year ICM trend:      Rosalene Billings, RN 11/05/2020 10:11 AM

## 2020-11-09 ENCOUNTER — Ambulatory Visit (INDEPENDENT_AMBULATORY_CARE_PROVIDER_SITE_OTHER): Payer: Medicaid Other

## 2020-11-09 DIAGNOSIS — Z95810 Presence of automatic (implantable) cardiac defibrillator: Secondary | ICD-10-CM

## 2020-11-09 DIAGNOSIS — I5022 Chronic systolic (congestive) heart failure: Secondary | ICD-10-CM

## 2020-11-09 NOTE — Progress Notes (Signed)
EPIC Encounter for ICM Monitoring  Patient Name: Gary Kirk is a 55 y.o. male Date: 11/09/2020 Primary Care Physican: Neale Burly, MD Primary Cardiologist: Dayna Ramus, NP Electrophysiologist: Audry Riles Pacing: 74.3% (86.0% on 8/1 report and 98.8% on 08/19/20 report)      11/05/2020 Weight: 347 lbs   Clinical Status (04-Nov-2020 to 09-Nov-2020) AT/AF Burden has increased from 37.6% (8/1 report) to 100%           Spoke with patient and heart failure questions reviewed.  Pt has been compliant with taking Furosemide as prescribed and fluid levels returned to normal.    Optivol thoracic impedance suggesting fluid levels returned to normal.  Message sent to [facility] clinic triage 9/9 & 9/13 to review further decrease in BiV Pacing and increase in AT/AF Burden.   Prescribed: Furosemide 20 mg take 1 tablet daily.   Labs: 10/22/2020 Creatinine 1.44, BUN 22, Potassium 3.1, Sodium 137, GFR 55->60 07/16/2020 Creatinine 1.10, BUN 10, Potassium 3.7, Sodium 139, GFR 76-88  A complete set of results can be found in Results Review.   Recommendations:  No changes and encouraged to call if experiencing any fluid symptoms.   Follow-up plan: ICM clinic phone appointment on 12/06/2020.  91 day device clinic remote transmission 11/12/2020.     EP/Cardiology Office Visits:  04/07/2021 with Dr. Harl Bowie.  07/01/2021 with Dr Rayann Heman.   Copy of ICM check sent to Dr. Rayann Heman.     3 month ICM trend: 11/09/2020.    1 Year ICM trend:       Rosalene Billings, RN 11/09/2020 1:27 PM

## 2020-11-12 ENCOUNTER — Telehealth: Payer: Self-pay

## 2020-11-12 NOTE — Telephone Encounter (Signed)
In collaboration with ICM clinic RN, Sharman Cheek, most recent remote transmission reviewed for concern over increased AT/AF burden. Per ICM documentation, patient is compliant with [place]. AT/AF burden currently 100% with Bi-V pacing down to 74.34%. V rates controlled. Transmission 8/1 AT/AF burden 37.6% with Bi-V pacing 85.98%. Current AT/AF episode onset 09/12/20. ICM RN spoke with patient 11/09/20 and noted patient was asymptomatic. Will discuss with Dr. Rayann Heman for additional recommendations for AF clinic vs. watchful waiting related to patient being asymptomatic with persistent AF.  Presenting 11/09/20

## 2020-11-15 ENCOUNTER — Ambulatory Visit: Payer: Medicaid Other | Admitting: Gastroenterology

## 2020-11-23 NOTE — Telephone Encounter (Signed)
Now persistently in afib.  Schedule appointment with Oda Kilts to discuss AF management and consider cardioversion

## 2020-12-06 ENCOUNTER — Telehealth: Payer: Self-pay

## 2020-12-06 ENCOUNTER — Ambulatory Visit (INDEPENDENT_AMBULATORY_CARE_PROVIDER_SITE_OTHER): Payer: Medicaid Other

## 2020-12-06 DIAGNOSIS — I5022 Chronic systolic (congestive) heart failure: Secondary | ICD-10-CM | POA: Diagnosis not present

## 2020-12-06 DIAGNOSIS — Z95810 Presence of automatic (implantable) cardiac defibrillator: Secondary | ICD-10-CM

## 2020-12-06 NOTE — Telephone Encounter (Signed)
Spoke with patient and requested manual remote transmission for monthly fluid level check.  He will send transmission today.

## 2020-12-06 NOTE — Progress Notes (Signed)
EPIC Encounter for ICM Monitoring  Patient Name: Gary Kirk is a 55 y.o. male Date: 12/06/2020 Primary Care Physican: Neale Burly, MD Primary Cardiologist: Dayna Ramus, NP Electrophysiologist: Audry Riles Pacing: 77.4% (86.0% on 8/1 report and 98.8% on 08/19/20 report)      12/06/2020 Weight: 347 lbs   Clinical Status (04-Nov-2020 to 09-Nov-2020) AT/AF Burden has increased from 37.6% (8/1 report) to 100%           Spoke with patient and heart failure questions reviewed.  Pt does not take daily Furosemide consistently aa prescribed.  He has an appointment with Oda Kilts, PA tomorrow regarding Afib.    Optivol thoracic impedance suggesting possible fluid accumulation starting 10/1.   Prescribed: Furosemide 20 mg take 1 tablet daily.   Labs: 10/22/2020 Creatinine 1.44, BUN 22, Potassium 3.1, Sodium 137, GFR 55->60 07/16/2020 Creatinine 1.10, BUN 10, Potassium 3.7, Sodium 139, GFR 76-88  A complete set of results can be found in Results Review.   Recommendations:  Advised to take Furosemide daily as prescribed.  Message sent to Oda Kilts, PA to review fluid levels in office and provide recommendations if needed.   Follow-up plan: ICM clinic phone appointment on 12/13/2020 (manual) to recheck fluid levels.  91 day device clinic remote transmission 02/11/2021.     EP/Cardiology Office Visits:  04/07/2021 with Dr. Harl Bowie.  07/01/2021 with Dr Rayann Heman.   Copy of ICM check sent to Dr. Rayann Heman.      3 month ICM trend: 12/06/2020.    1 Year ICM trend:       Rosalene Billings, RN 12/06/2020 2:29 PM

## 2020-12-07 ENCOUNTER — Encounter (HOSPITAL_COMMUNITY): Payer: Self-pay | Admitting: Internal Medicine

## 2020-12-07 ENCOUNTER — Other Ambulatory Visit: Payer: Self-pay

## 2020-12-07 ENCOUNTER — Ambulatory Visit (INDEPENDENT_AMBULATORY_CARE_PROVIDER_SITE_OTHER): Payer: Medicaid Other | Admitting: Student

## 2020-12-07 ENCOUNTER — Encounter: Payer: Self-pay | Admitting: Student

## 2020-12-07 VITALS — BP 140/82 | HR 98 | Ht 72.0 in | Wt 343.4 lb

## 2020-12-07 DIAGNOSIS — I5022 Chronic systolic (congestive) heart failure: Secondary | ICD-10-CM

## 2020-12-07 DIAGNOSIS — I428 Other cardiomyopathies: Secondary | ICD-10-CM

## 2020-12-07 DIAGNOSIS — I4819 Other persistent atrial fibrillation: Secondary | ICD-10-CM | POA: Diagnosis not present

## 2020-12-07 DIAGNOSIS — I480 Paroxysmal atrial fibrillation: Secondary | ICD-10-CM

## 2020-12-07 DIAGNOSIS — G4733 Obstructive sleep apnea (adult) (pediatric): Secondary | ICD-10-CM

## 2020-12-07 LAB — CUP PACEART INCLINIC DEVICE CHECK
Battery Remaining Longevity: 91 mo
Battery Voltage: 3 V
Brady Statistic AP VP Percent: 25.16 %
Brady Statistic AP VS Percent: 0.02 %
Brady Statistic AS VP Percent: 59.19 %
Brady Statistic AS VS Percent: 15.63 %
Brady Statistic RA Percent Paced: 16.92 %
Brady Statistic RV Percent Paced: 84.85 %
Date Time Interrogation Session: 20221011124054
HighPow Impedance: 88 Ohm
Implantable Lead Implant Date: 20150904
Implantable Lead Implant Date: 20150904
Implantable Lead Implant Date: 20150904
Implantable Lead Location: 753858
Implantable Lead Location: 753859
Implantable Lead Location: 753860
Implantable Lead Model: 4298
Implantable Lead Model: 5076
Implantable Lead Model: 6935
Implantable Pulse Generator Implant Date: 20210524
Lead Channel Impedance Value: 1026 Ohm
Lead Channel Impedance Value: 1064 Ohm
Lead Channel Impedance Value: 237.5 Ohm
Lead Channel Impedance Value: 250.943
Lead Channel Impedance Value: 283.468
Lead Channel Impedance Value: 283.468
Lead Channel Impedance Value: 302.831
Lead Channel Impedance Value: 475 Ohm
Lead Channel Impedance Value: 475 Ohm
Lead Channel Impedance Value: 532 Ohm
Lead Channel Impedance Value: 551 Ohm
Lead Channel Impedance Value: 646 Ohm
Lead Channel Impedance Value: 646 Ohm
Lead Channel Impedance Value: 646 Ohm
Lead Channel Impedance Value: 703 Ohm
Lead Channel Impedance Value: 836 Ohm
Lead Channel Impedance Value: 893 Ohm
Lead Channel Impedance Value: 931 Ohm
Lead Channel Pacing Threshold Amplitude: 0.375 V
Lead Channel Pacing Threshold Amplitude: 1 V
Lead Channel Pacing Threshold Amplitude: 1.125 V
Lead Channel Pacing Threshold Pulse Width: 0.4 ms
Lead Channel Pacing Threshold Pulse Width: 0.4 ms
Lead Channel Pacing Threshold Pulse Width: 0.4 ms
Lead Channel Sensing Intrinsic Amplitude: 0.875 mV
Lead Channel Sensing Intrinsic Amplitude: 1.625 mV
Lead Channel Sensing Intrinsic Amplitude: 5.75 mV
Lead Channel Sensing Intrinsic Amplitude: 7.25 mV
Lead Channel Setting Pacing Amplitude: 1.5 V
Lead Channel Setting Pacing Amplitude: 1.5 V
Lead Channel Setting Pacing Amplitude: 2.25 V
Lead Channel Setting Pacing Pulse Width: 0.4 ms
Lead Channel Setting Pacing Pulse Width: 0.4 ms
Lead Channel Setting Sensing Sensitivity: 0.3 mV

## 2020-12-07 NOTE — Progress Notes (Signed)
Electrophysiology Office Note Date: 12/07/2020  ID:  Gary Kirk, DOB 04-28-1965, MRN [id number]  PCP: Neale Burly, MD Primary Cardiologist: Ida Rogue, MD Electrophysiologist: Thompson Grayer, MD  (Previously by [facility] but moved to [facility])  CC: Routine ICD follow-up  Gary Kirk is a 55 y.o. male seen today for Thompson Grayer, MD for acute visit due to persistent AF .  Since last being seen in our clinic the patient reports more SOB and fatigue. SOB with anything more than ADLs. He states he misses his medication very frequently. He only takes them 4-5 days a week, including Eliquis  he denies chest pain, palpitations, PND, orthopnea, nausea, vomiting, dizziness, syncope, edema, weight gain, or early satiety. He has not had ICD shocks.   Device History: Medtronic BiV ICD implanted 06/2019 for NICM  Past Medical History:  Diagnosis Date   AICD (automatic cardioverter/defibrillator) present    a. 10/2013 s/p MDT [id number] BiV ICD (ser# [id number]).   COPD (chronic obstructive pulmonary disease) ([place])    a. Quit smoking in 2015.   HFrEF (heart failure with reduced ejection fraction) ([place])    a. 02/2012 Echo: EF "depressed";  b. 03/2013 Echo: Ef 15%, mod to sev LV dil w/ anteroseptal AK and sev glob HK. Dilated RV w/ reduced fxn. Mild to mod RAE, Sev LAE. Mild TR; c. 11/2013 Echo: EF 10-15%, redcued RV fxn, mild BAE, mild TR.   History of ileus    Hypertension    Hypokalemia    Hypothyroidism    Major depression    NICM (nonischemic cardiomyopathy) ([place])    a. 02/2012 Echo: depressed EF; b. 02/2012 MV: EF 38%, small fixed apical defect, no ischemia; c. 03/2013 Echo: EF 15%; d. 08/2013 MV: apical ant, septal inf fixed defects, no ischemia; e. 10/2013 s/p MDT [id number] BiV ICD; d. 11/2013 Cath: results not avail, reportedly nl cors; e. 09/2014 MV: Fixed inf defect consistent w/ diaphragmatic atten. No ischemia. EF 28%.   Persistent atrial fibrillation (HCC)    a. CHA2DS2VASc =  2-->Eliquis.   Sleep apnea    a. Doesn't tolerate CPAP.   Past Surgical History:  Procedure Laterality Date   BIV ICD GENERATOR CHANGEOUT N/A 07/21/2019   Procedure: BIV ICD GENERATOR CHANGEOUT;  Surgeon: Deboraha Sprang, MD;  Location: [facility];  Service: Cardiovascular;  Laterality: N/A;   CARDIAC DEFIBRILLATOR PLACEMENT     CATARACT EXTRACTION W/PHACO Left 09/20/2020   Procedure: CATARACT EXTRACTION PHACO AND INTRAOCULAR LENS PLACEMENT (IOC);  Surgeon: Baruch Goldmann, MD;  Location: [facility];  Service: Ophthalmology;  Laterality: Left;  CDE   6.21   PACEMAKER IMPLANT     SHOULDER DEBRIDEMENT Left     Current Outpatient Medications  Medication Sig Dispense Refill   acetaminophen (TYLENOL) 500 MG tablet Take 1,000 mg by mouth every 6 (six) hours as needed for moderate pain or headache.     apixaban (ELIQUIS) 5 MG TABS tablet Take 5 mg by mouth 2 (two) times daily.     carvedilol (COREG) 12.5 MG tablet Take 1 tablet (12.5 mg total) by mouth 2 (two) times daily with a meal. 60 tablet 0   famotidine (PEPCID) 40 MG tablet Take 40 mg by mouth daily.     furosemide (LASIX) 20 MG tablet Take 1 tablet (20 mg total) by mouth daily.     gabapentin (NEURONTIN) 300 MG capsule Take 300 mg by mouth 3 (three) times daily.      levothyroxine (SYNTHROID) 112 MCG  tablet Take 112 mcg by mouth daily before breakfast.     PROAIR HFA 108 (90 Base) MCG/ACT inhaler Inhale 2 puffs into the lungs every 4 (four) hours as needed for wheezing or shortness of breath.   3   sertraline (ZOLOFT) 50 MG tablet Take 50 mg by mouth daily.     SPIRIVA HANDIHALER 18 MCG inhalation capsule Place 18 mcg into inhaler and inhale daily.     SYMBICORT 160-4.5 MCG/ACT inhaler Inhale 2 puffs into the lungs in the morning and at bedtime.     No current facility-administered medications for this visit.    Allergies:   Aripiprazole, Tramadol, and Ibuprofen   Social History: Social History   Socioeconomic History    Marital status: Legally Separated    Spouse name: Not on file   Number of children: Not on file   Years of education: Not on file   Highest education level: Not on file  Occupational History   Not on file  Tobacco Use   Smoking status: Former    Packs/day: 2.00    Years: 30.00    Pack years: 60.00    Types: Cigarettes    Quit date: 04/29/2014    Years since quitting: 6.6   Smokeless tobacco: Never  Vaping Use   Vaping Use: Never used  Substance and Sexual Activity   Alcohol use: No   Drug use: Yes    Types: Marijuana    Comment: last use last night   Sexual activity: Yes  Other Topics Concern   Not on file  Social History Narrative   Not on file   Social Determinants of Health   Financial Resource Strain: Not on file  Food Insecurity: Not on file  Transportation Needs: Not on file  Physical Activity: Not on file  Stress: Not on file  Social Connections: Not on file  Intimate Partner Violence: Not on file    Family History: Family History  Problem Relation Age of Onset   COPD Mother        decsd 2008   Hypertension Mother    Atrial fibrillation Mother    Heart failure Mother    Cancer Father        unknown cancer, died when patient was 38   Lung cancer Brother    Atrial fibrillation Brother     Review of Systems: All other systems reviewed and are otherwise negative except as noted above.   Physical Exam: Vitals:   12/07/20 1201  BP: 140/82  Pulse: 98  SpO2: 98%  Weight: (!) 343 lb 6.4 oz (155.8 kg)  Height: 6' (1.829 m)     GEN- The patient is well appearing, alert and oriented x 3 today.   HEENT: normocephalic, atraumatic; sclera clear, conjunctiva pink; hearing intact; oropharynx clear; neck supple, no JVP Lymph- no cervical lymphadenopathy Lungs- Clear to ausculation bilaterally, normal work of breathing.  No wheezes, rales, rhonchi Heart- Irregularly irregular rate and rhythm, no murmurs, rubs or gallops, PMI not laterally displaced GI- soft,  non-tender, non-distended, bowel sounds present, no hepatosplenomegaly Extremities- no clubbing or cyanosis. No edema; DP/PT/radial pulses 2+ bilaterally MS- no significant deformity or atrophy Skin- warm and dry, no rash or lesion; ICD pocket well healed Psych- euthymic mood, full affect Neuro- strength and sensation are intact  ICD interrogation- reviewed in detail today,  See PACEART report  EKG:  EKG is ordered today. Personal review of EKG ordered today shows AF at 98 bpm  Recent Labs: No results  found for requested labs within last 8760 hours.   Wt Readings from Last 3 Encounters:  12/07/20 (!) 343 lb 6.4 oz (155.8 kg)  09/28/20 (!) 353 lb (160.1 kg)  07/02/20 (!) 353 lb (160.1 kg)     Other studies Reviewed: Additional studies/ records that were reviewed today include: Previous EP office notes.   Assessment and Plan:  1.  Chronic systolic dysfunction s/p Medtronic CRT-D  euvolemic today Stable on an appropriate medical regimen Normal ICD function See Pace Art report No changes today  2. Persistent atrial fibrillation Since end of July, rates variable but confounded by medical non-compliance With missed doses of Eliquis, will need to plan TEE/DCC as patient is having NYHAIII-IIIb symptoms in AF.  Stressed importance of no missed doses of Eliquis. More regular lasix and coreg use may also help with his volume status and rates, respectively.  I think overall he is a poor candidate for AAD due to his non-compliance, but perhaps with admonitions today he can make himself a better candidate Body mass index is 46.57 kg/m.  Non-compliant with CPAP.    Not ablation candidate given body habitus and non-compliance.  Not tikosyn candidate with non-compliance Poor candidate for amio given young age.  Could consider flecainide with normalization of EF, no known CAD, and pacer already in place (RBBB).  May be able to consider other AAD options with normal EF. For the time being,  would avoid meds where compliance is a strong factor.   3. Morbid obesity Body mass index is 46.57 kg/m.  Encouraged lifestyle modification  4. OSA Non compliant on CPAP  Current medicines are reviewed at length with the patient today.    Labs/ tests ordered today include:  Orders Placed This Encounter  Procedures   Basic metabolic panel   CBC   EKG 12-Lead   Disposition:   Follow up with  AF clinic post [facility] to discuss AAD options.   Jacalyn Lefevre, PA-C  12/07/2020 12:40 PM  [place] [address] [phone] (office) [phone] (fax)

## 2020-12-07 NOTE — H&P (View-Only) (Signed)
Electrophysiology Office Note Date: 12/07/2020  ID:  Gary Kirk, DOB 1966-01-05, MRN [id number]  PCP: Gary Burly, MD Primary Cardiologist: Gary Rogue, MD Electrophysiologist: Gary Grayer, MD  (Previously by [facility] but moved to [facility])  CC: Routine ICD follow-up  Gary Kirk is a 55 y.o. male seen today for Gary Grayer, MD for acute visit due to persistent AF .  Since last being seen in our clinic the patient reports more SOB and fatigue. SOB with anything more than ADLs. He states he misses his medication very frequently. He only takes them 4-5 days a week, including Eliquis  he denies chest pain, palpitations, PND, orthopnea, nausea, vomiting, dizziness, syncope, edema, weight gain, or early satiety. He has not had ICD shocks.   Device History: Medtronic BiV ICD implanted 06/2019 for NICM  Past Medical History:  Diagnosis Date   AICD (automatic cardioverter/defibrillator) present    a. 10/2013 s/p MDT [id number] BiV ICD (ser# [id number]).   COPD (chronic obstructive pulmonary disease) ([place])    a. Quit smoking in 2015.   HFrEF (heart failure with reduced ejection fraction) ([place])    a. 02/2012 Echo: EF "depressed";  b. 03/2013 Echo: Ef 15%, mod to sev LV dil w/ anteroseptal AK and sev glob HK. Dilated RV w/ reduced fxn. Mild to mod RAE, Sev LAE. Mild TR; c. 11/2013 Echo: EF 10-15%, redcued RV fxn, mild BAE, mild TR.   History of ileus    Hypertension    Hypokalemia    Hypothyroidism    Major depression    NICM (nonischemic cardiomyopathy) ([place])    a. 02/2012 Echo: depressed EF; b. 02/2012 MV: EF 38%, small fixed apical defect, no ischemia; c. 03/2013 Echo: EF 15%; d. 08/2013 MV: apical ant, septal inf fixed defects, no ischemia; e. 10/2013 s/p MDT [id number] BiV ICD; d. 11/2013 Cath: results not avail, reportedly nl cors; e. 09/2014 MV: Fixed inf defect consistent w/ diaphragmatic atten. No ischemia. EF 28%.   Persistent atrial fibrillation (HCC)    a. CHA2DS2VASc =  2-->Eliquis.   Sleep apnea    a. Doesn't tolerate CPAP.   Past Surgical History:  Procedure Laterality Date   BIV ICD GENERATOR CHANGEOUT N/A 07/21/2019   Procedure: BIV ICD GENERATOR CHANGEOUT;  Surgeon: Deboraha Sprang, MD;  Location: [facility];  Service: Cardiovascular;  Laterality: N/A;   CARDIAC DEFIBRILLATOR PLACEMENT     CATARACT EXTRACTION W/PHACO Left 09/20/2020   Procedure: CATARACT EXTRACTION PHACO AND INTRAOCULAR LENS PLACEMENT (IOC);  Surgeon: Baruch Goldmann, MD;  Location: [facility];  Service: Ophthalmology;  Laterality: Left;  CDE   6.21   PACEMAKER IMPLANT     SHOULDER DEBRIDEMENT Left     Current Outpatient Medications  Medication Sig Dispense Refill   acetaminophen (TYLENOL) 500 MG tablet Take 1,000 mg by mouth every 6 (six) hours as needed for moderate pain or headache.     apixaban (ELIQUIS) 5 MG TABS tablet Take 5 mg by mouth 2 (two) times daily.     carvedilol (COREG) 12.5 MG tablet Take 1 tablet (12.5 mg total) by mouth 2 (two) times daily with a meal. 60 tablet 0   famotidine (PEPCID) 40 MG tablet Take 40 mg by mouth daily.     furosemide (LASIX) 20 MG tablet Take 1 tablet (20 mg total) by mouth daily.     gabapentin (NEURONTIN) 300 MG capsule Take 300 mg by mouth 3 (three) times daily.      levothyroxine (SYNTHROID) 112 MCG  tablet Take 112 mcg by mouth daily before breakfast.     PROAIR HFA 108 (90 Base) MCG/ACT inhaler Inhale 2 puffs into the lungs every 4 (four) hours as needed for wheezing or shortness of breath.   3   sertraline (ZOLOFT) 50 MG tablet Take 50 mg by mouth daily.     SPIRIVA HANDIHALER 18 MCG inhalation capsule Place 18 mcg into inhaler and inhale daily.     SYMBICORT 160-4.5 MCG/ACT inhaler Inhale 2 puffs into the lungs in the morning and at bedtime.     No current facility-administered medications for this visit.    Allergies:   Aripiprazole, Tramadol, and Ibuprofen   Social History: Social History   Socioeconomic History    Marital status: Legally Separated    Spouse name: Not on file   Number of children: Not on file   Years of education: Not on file   Highest education level: Not on file  Occupational History   Not on file  Tobacco Use   Smoking status: Former    Packs/day: 2.00    Years: 30.00    Pack years: 60.00    Types: Cigarettes    Quit date: 04/29/2014    Years since quitting: 6.6   Smokeless tobacco: Never  Vaping Use   Vaping Use: Never used  Substance and Sexual Activity   Alcohol use: No   Drug use: Yes    Types: Marijuana    Comment: last use last night   Sexual activity: Yes  Other Topics Concern   Not on file  Social History Narrative   Not on file   Social Determinants of Health   Financial Resource Strain: Not on file  Food Insecurity: Not on file  Transportation Needs: Not on file  Physical Activity: Not on file  Stress: Not on file  Social Connections: Not on file  Intimate Partner Violence: Not on file    Family History: Family History  Problem Relation Age of Onset   COPD Mother        decsd 2008   Hypertension Mother    Atrial fibrillation Mother    Heart failure Mother    Cancer Father        unknown cancer, died when patient was 65   Lung cancer Brother    Atrial fibrillation Brother     Review of Systems: All other systems reviewed and are otherwise negative except as noted above.   Physical Exam: Vitals:   12/07/20 1201  BP: 140/82  Pulse: 98  SpO2: 98%  Weight: (!) 343 lb 6.4 oz (155.8 kg)  Height: 6' (1.829 m)     GEN- The patient is well appearing, alert and oriented x 3 today.   HEENT: normocephalic, atraumatic; sclera clear, conjunctiva pink; hearing intact; oropharynx clear; neck supple, no JVP Lymph- no cervical lymphadenopathy Lungs- Clear to ausculation bilaterally, normal work of breathing.  No wheezes, rales, rhonchi Heart- Irregularly irregular rate and rhythm, no murmurs, rubs or gallops, PMI not laterally displaced GI- soft,  non-tender, non-distended, bowel sounds present, no hepatosplenomegaly Extremities- no clubbing or cyanosis. No edema; DP/PT/radial pulses 2+ bilaterally MS- no significant deformity or atrophy Skin- warm and dry, no rash or lesion; ICD pocket well healed Psych- euthymic mood, full affect Neuro- strength and sensation are intact  ICD interrogation- reviewed in detail today,  See PACEART report  EKG:  EKG is ordered today. Personal review of EKG ordered today shows AF at 98 bpm  Recent Labs: No results  found for requested labs within last 8760 hours.   Wt Readings from Last 3 Encounters:  12/07/20 (!) 343 lb 6.4 oz (155.8 kg)  09/28/20 (!) 353 lb (160.1 kg)  07/02/20 (!) 353 lb (160.1 kg)     Other studies Reviewed: Additional studies/ records that were reviewed today include: Previous EP office notes.   Assessment and Plan:  1.  Chronic systolic dysfunction s/p Medtronic CRT-D  euvolemic today Stable on an appropriate medical regimen Normal ICD function See [facility] Art report No changes today  2. Persistent atrial fibrillation Since end of July, rates variable but confounded by medical non-compliance With missed doses of Eliquis, will need to plan TEE/DCC as patient is having NYHAIII-IIIb symptoms in AF.  Stressed importance of no missed doses of Eliquis. More regular lasix and coreg use may also help with his volume status and rates, respectively.  I think overall he is a poor candidate for AAD due to his non-compliance, but perhaps with admonitions today he can make himself a better candidate Body mass index is 46.57 kg/m.  Non-compliant with CPAP.    Not ablation candidate given body habitus and non-compliance.  Not tikosyn candidate with non-compliance Poor candidate for amio given young age.  Could consider flecainide with normalization of EF, no known CAD, and pacer already in place (RBBB).  May be able to consider other AAD options with normal EF. For the time being,  would avoid meds where compliance is a strong factor.   3. Morbid obesity Body mass index is 46.57 kg/m.  Encouraged lifestyle modification  4. OSA Non compliant on CPAP  Current medicines are reviewed at length with the patient today.    Labs/ tests ordered today include:  Orders Placed This Encounter  Procedures   Basic metabolic panel   CBC   EKG 12-Lead   Disposition:   Follow up with  AF clinic post [facility] to discuss AAD options.   Jacalyn Lefevre, PA-C  12/07/2020 12:40 PM  [address] [place] [address] [phone] (office) [phone] (fax)

## 2020-12-07 NOTE — Patient Instructions (Signed)
Medication Instructions:  Your physician recommends that you continue on your current medications as directed. Please refer to the Current Medication list given to you today.  *If you need a refill on your cardiac medications before your next appointment, please call your pharmacy*   Lab Work:  TODAY:  BMET & CBC  If you have labs (blood work) drawn today and your tests are completely normal, you will receive your results only by: [place] (if you have MyChart) OR A paper copy in the mail If you have any lab test that is abnormal or we need to change your treatment, we will call you to review the results.   Testing/Procedures: Your physician has recommended that you have a Cardioversion (DCCV). Electrical Cardioversion uses a jolt of electricity to your heart either through paddles or wired patches attached to your chest. This is a controlled, usually prescheduled, procedure. Defibrillation is done under light anesthesia in the hospital, and you usually go home the day of the procedure. This is done to get your heart back into a normal rhythm. You are not awake for the procedure. Please see the instruction sheet given to you BELOW:   You are scheduled for a TEE/Cardioversion/TEE Cardioversion on 12/14/2020 with Dr. Harrington Challenger.  Please arrive at the [facility] (Main Entrance A) at [facility]: [address] [place], [place] [address] at 12:00 p  DIET: Nothing to eat or drink after midnight except a sip of water with medications (see medication instructions below)  FYI: For your safety, and to allow [place] to monitor your vital signs accurately during the surgery/procedure we request that   if you have artificial nails, gel coating, SNS etc. Please have those removed prior to your surgery/procedure. Not having the nail coverings /polish removed may result in cancellation or delay of your surgery/procedure.   Medication Instructions: Hold Furosemide  Continue your anticoagulant:  Eliquis You will need to continue your anticoagulant after your procedure until you  are told by your  Provider that it is safe to stop   You must have a responsible person to drive you home and stay in the waiting area during your procedure. Failure to do so could result in cancellation.  Bring your insurance cards.  *Special Note: Every effort is made to have your procedure done on time. Occasionally there are emergencies that occur at the hospital that may cause delays. Please be patient if a delay does occur.      Follow-Up: At [facility], you and your health needs are our priority.  As part of our continuing mission to provide you with exceptional heart care, we have created designated Provider Care Teams.  These Care Teams include your primary Cardiologist (physician) and Advanced Practice Providers (APPs -  Physician Assistants and Nurse Practitioners) who all work together to provide you with the care you need, when you need it.  We recommend signing up for the patient portal called "MyChart".  Sign up information is provided on this After Visit Summary.  MyChart is used to connect with patients for Virtual Visits (Telemedicine).  Patients are able to view lab/test results, encounter notes, upcoming appointments, etc.  Non-urgent messages can be sent to your provider as well.   To learn more about what you can do with MyChart, go to [email or web address].    Your next appointment:   1-2 week(s)  The format for your next appointment:   In Person  Provider:   You will follow up in the Atrial  Fibrillation Clinic located at [facility]. Your provider will be: Roderic Palau, NP or Clint R. Fenton, PA-C   Other Instructions

## 2020-12-08 LAB — CBC
Hematocrit: 44.6 % (ref 37.5–51.0)
Hemoglobin: 15.5 g/dL (ref 13.0–17.7)
MCH: 31.6 pg (ref 26.6–33.0)
MCHC: 34.8 g/dL (ref 31.5–35.7)
MCV: 91 fL (ref 79–97)
Platelets: 204 10*3/uL (ref 150–450)
RBC: 4.91 x10E6/uL (ref 4.14–5.80)
RDW: 12.9 % (ref 11.6–15.4)
WBC: 9.3 10*3/uL (ref 3.4–10.8)

## 2020-12-08 LAB — BASIC METABOLIC PANEL
BUN/Creatinine Ratio: 7 — ABNORMAL LOW (ref 9–20)
BUN: 7 mg/dL (ref 6–24)
CO2: 21 mmol/L (ref 20–29)
Calcium: 9.6 mg/dL (ref 8.7–10.2)
Chloride: 99 mmol/L (ref 96–106)
Creatinine, Ser: 1.07 mg/dL (ref 0.76–1.27)
Glucose: 75 mg/dL (ref 70–99)
Potassium: 4.1 mmol/L (ref 3.5–5.2)
Sodium: 138 mmol/L (ref 134–144)
eGFR: 81 mL/min/{1.73_m2} (ref >59)

## 2020-12-13 ENCOUNTER — Ambulatory Visit (INDEPENDENT_AMBULATORY_CARE_PROVIDER_SITE_OTHER): Payer: Medicaid Other

## 2020-12-13 DIAGNOSIS — I5022 Chronic systolic (congestive) heart failure: Secondary | ICD-10-CM

## 2020-12-13 DIAGNOSIS — Z95810 Presence of automatic (implantable) cardiac defibrillator: Secondary | ICD-10-CM

## 2020-12-14 ENCOUNTER — Other Ambulatory Visit: Payer: Self-pay

## 2020-12-14 ENCOUNTER — Ambulatory Visit (HOSPITAL_COMMUNITY): Payer: Medicaid Other | Admitting: Anesthesiology

## 2020-12-14 ENCOUNTER — Ambulatory Visit (HOSPITAL_BASED_OUTPATIENT_CLINIC_OR_DEPARTMENT_OTHER)
Admission: RE | Admit: 2020-12-14 | Discharge: 2020-12-14 | Disposition: A | Discharge status: Home / Self Care | Payer: Medicaid Other | Source: Home / Self Care | Attending: Student | Admitting: Student

## 2020-12-14 ENCOUNTER — Encounter (HOSPITAL_COMMUNITY)
Admission: RE | Disposition: A | Discharge status: Home / Self Care | Payer: Self-pay | Source: Home / Self Care | Attending: Internal Medicine

## 2020-12-14 ENCOUNTER — Encounter (HOSPITAL_COMMUNITY): Payer: Self-pay | Admitting: Internal Medicine

## 2020-12-14 ENCOUNTER — Ambulatory Visit (HOSPITAL_COMMUNITY)
Admission: RE | Admit: 2020-12-14 | Discharge: 2020-12-14 | Disposition: A | Discharge status: Home / Self Care | Payer: Medicaid Other | Attending: Internal Medicine | Admitting: Internal Medicine

## 2020-12-14 DIAGNOSIS — G4733 Obstructive sleep apnea (adult) (pediatric): Secondary | ICD-10-CM | POA: Diagnosis not present

## 2020-12-14 DIAGNOSIS — Z7951 Long term (current) use of inhaled steroids: Secondary | ICD-10-CM | POA: Diagnosis not present

## 2020-12-14 DIAGNOSIS — Z886 Allergy status to analgesic agent status: Secondary | ICD-10-CM | POA: Insufficient documentation

## 2020-12-14 DIAGNOSIS — Z885 Allergy status to narcotic agent status: Secondary | ICD-10-CM | POA: Diagnosis not present

## 2020-12-14 DIAGNOSIS — Z95810 Presence of automatic (implantable) cardiac defibrillator: Secondary | ICD-10-CM | POA: Diagnosis not present

## 2020-12-14 DIAGNOSIS — Z6841 Body Mass Index (BMI) 40.0 and over, adult: Secondary | ICD-10-CM | POA: Insufficient documentation

## 2020-12-14 DIAGNOSIS — Z888 Allergy status to other drugs, medicaments and biological substances status: Secondary | ICD-10-CM | POA: Insufficient documentation

## 2020-12-14 DIAGNOSIS — I428 Other cardiomyopathies: Secondary | ICD-10-CM | POA: Insufficient documentation

## 2020-12-14 DIAGNOSIS — Z87891 Personal history of nicotine dependence: Secondary | ICD-10-CM | POA: Diagnosis not present

## 2020-12-14 DIAGNOSIS — I4891 Unspecified atrial fibrillation: Secondary | ICD-10-CM

## 2020-12-14 DIAGNOSIS — Z7901 Long term (current) use of anticoagulants: Secondary | ICD-10-CM | POA: Insufficient documentation

## 2020-12-14 DIAGNOSIS — Z8249 Family history of ischemic heart disease and other diseases of the circulatory system: Secondary | ICD-10-CM | POA: Diagnosis not present

## 2020-12-14 DIAGNOSIS — I340 Nonrheumatic mitral (valve) insufficiency: Secondary | ICD-10-CM

## 2020-12-14 DIAGNOSIS — I4819 Other persistent atrial fibrillation: Secondary | ICD-10-CM | POA: Diagnosis present

## 2020-12-14 DIAGNOSIS — Z79899 Other long term (current) drug therapy: Secondary | ICD-10-CM | POA: Diagnosis not present

## 2020-12-14 HISTORY — PX: CARDIOVERSION: SHX1299

## 2020-12-14 HISTORY — PX: TEE WITHOUT CARDIOVERSION: SHX5443

## 2020-12-14 SURGERY — ECHOCARDIOGRAM, TRANSESOPHAGEAL
Anesthesia: General

## 2020-12-14 MED ORDER — SODIUM CHLORIDE 0.9 % IV SOLN: INTRAVENOUS | Status: DC | PRN

## 2020-12-14 MED ORDER — PROPOFOL 500 MG/50ML IV EMUL
INTRAVENOUS | Status: DC | PRN
  Administered 2020-12-14: 115 ug/kg/min via INTRAVENOUS

## 2020-12-14 MED ORDER — LIDOCAINE 2% (20 MG/ML) 5 ML SYRINGE
INTRAMUSCULAR | Status: DC | PRN
  Administered 2020-12-14: 60 mg via INTRAVENOUS

## 2020-12-14 MED ORDER — PROPOFOL 10 MG/ML IV BOLUS
INTRAVENOUS | Status: DC | PRN
  Administered 2020-12-14: 35 mg via INTRAVENOUS

## 2020-12-14 NOTE — Interval H&P Note (Signed)
History and Physical Interval Note:  12/14/2020 1:54 PM  Gary Kirk  has presented today for surgery, with the diagnosis of AFIB.  The various methods of treatment have been discussed with the patient and family. After consideration of risks, benefits and other options for treatment, the patient has consented to  Procedure(s): TRANSESOPHAGEAL ECHOCARDIOGRAM (TEE) (N/A) CARDIOVERSION (N/A) as a surgical intervention.  The patient's history has been reviewed, patient examined, no change in status, stable for surgery.  I have reviewed the patient's chart and labs.  Questions were answered to the patient's satisfaction.     Dorris Carnes

## 2020-12-14 NOTE — CV Procedure (Signed)
TEE  Patient sedated by anesthesia with propofol intravenously Bite guard placed TEE advanced into mouth and then to mid esophagus  Preliminary    LA, LAA without masses  LVEF 45 to 50%    FUll report to follow     Procedure was without complication.  Dorris Carnes MD

## 2020-12-14 NOTE — Anesthesia Postprocedure Evaluation (Signed)
Anesthesia Post Note  Patient: BRYLEY KOVACEVIC  Procedure(s) Performed: TRANSESOPHAGEAL ECHOCARDIOGRAM (TEE) CARDIOVERSION     Patient location during evaluation: Endoscopy Anesthesia Type: General Level of consciousness: awake and alert Pain management: pain level controlled Vital Signs Assessment: post-procedure vital signs reviewed and stable Respiratory status: spontaneous breathing, nonlabored ventilation, respiratory function stable and patient connected to nasal cannula oxygen Cardiovascular status: stable and blood pressure returned to baseline Postop Assessment: no apparent nausea or vomiting Anesthetic complications: no   No notable events documented.  Last Vitals:  Vitals:   12/14/20 1447 12/14/20 1455  BP: (!) 151/98 (!) 157/101  Pulse: 79 74  Resp: 12 16  Temp:    SpO2: 100% 98%    Last Pain:  Vitals:   12/14/20 1455  TempSrc:   PainSc: 0-No pain                 Maliik Karner

## 2020-12-14 NOTE — CV Procedure (Signed)
CARDIOVERSION  Patient already sedated for TEE  WIth pads in apex/base position, cardioversion attempted with 200 J synchronized biphasic energy  Not successful  WIth pads in AP position, cardioversion attempted with 200 J synchronized biphasic energy   Unsuccessful  With pads in AP position, cardioversion attempted with 200  J synchronized biphasic energy   Unsuccessful. Heart rate did go down from 90s to 80 but not sinus or atrial paced    Device interrogated via Medtronic to confirm    Pt will be discharged home   Will need to follow up in clinic for further plans  Procedure without complication  Dorris Carnes MD

## 2020-12-14 NOTE — Transfer of Care (Signed)
Immediate Anesthesia Transfer of Care Note  Patient: Gary Kirk  Procedure(s) Performed: TRANSESOPHAGEAL ECHOCARDIOGRAM (TEE) CARDIOVERSION  Patient Location: Endoscopy Unit  Anesthesia Type:General  Level of Consciousness: awake, alert , oriented and patient cooperative  Airway & Oxygen Therapy: Patient Spontanous Breathing  Post-op Assessment: Report given to RN, Post -op Vital signs reviewed and stable and Patient moving all extremities  Post vital signs: Reviewed and stable  Last Vitals:  Vitals Value Taken Time  BP 115/91 12/14/20 1434  Temp    Pulse 83 12/14/20 1434  Resp 16 12/14/20 1434  SpO2 99 % 12/14/20 1434  Vitals shown include unvalidated device data.  Last Pain:  Vitals:   12/14/20 1240  TempSrc: Temporal  PainSc: 0-No pain         Complications: No notable events documented.

## 2020-12-14 NOTE — Anesthesia Preprocedure Evaluation (Signed)
Anesthesia Evaluation  Patient identified by MRN, date of birth, ID band Patient awake    Reviewed: Allergy & Precautions, NPO status , Patient's Chart, lab work & pertinent test results  History of Anesthesia Complications Negative for: history of anesthetic complications  Airway Mallampati: II  TM Distance: >3 FB Neck ROM: Full    Dental  (+) Edentulous Upper, Edentulous Lower, Dental Advisory Given   Pulmonary shortness of breath, sleep apnea , COPD,  COPD inhaler, former smoker,     + wheezing      Cardiovascular hypertension, Pt. on medications + CAD and +CHF  + dysrhythmias + Cardiac Defibrillator  Rhythm:Irregular  1. Left ventricular ejection fraction, by visual estimation, is 55 to  60%. The left ventricle has normal function. There is no left ventricular  hypertrophy.  2. The left ventricle demonstrates global hypokinesis.  3. Global right ventricle has normal systolic function.The right  ventricular size is normal. No increase in right ventricular wall  thickness.  4. Left atrial size was normal.  5. Right atrial size was normal.  6. The mitral valve is normal in structure. Trivial mitral valve  regurgitation.  7. The tricuspid valve is grossly normal.  8. The aortic valve is tricuspid. Aortic valve regurgitation is not  visualized. No evidence of aortic valve sclerosis or stenosis.  9. The pulmonic valve was not well visualized. Pulmonic valve  regurgitation is not visualized.  10. Aortic dilatation noted.  11. There is mild dilatation of the ascending aorta measuring 39 mm.  12. A pacer wire is visualized in the RA and RV.  13. The inferior vena cava is normal in size with greater than 50%  respiratory variability, suggesting right atrial pressure of 3 mmHg.    Neuro/Psych PSYCHIATRIC DISORDERS Depression negative neurological ROS     GI/Hepatic negative GI ROS,   Endo/Other  Hypothyroidism Morbid  obesity  Renal/GU CRFRenal disease     Musculoskeletal   Abdominal   Peds  Hematology Lab Results      Component                Value               Date                      WBC                      9.3                 12/07/2020                HGB                      15.5                12/07/2020                HCT                      44.6                12/07/2020                MCV                      91  12/07/2020                PLT                      204                 12/07/2020            eliquis   Anesthesia Other Findings   Reproductive/Obstetrics                             Anesthesia Physical Anesthesia Plan  ASA: 3  Anesthesia Plan: General   Post-op Pain Management:    Induction: Intravenous  PONV Risk Score and Plan: 2 and Propofol infusion and Treatment may vary due to age or medical condition  Airway Management Planned: Nasal Cannula  Additional Equipment: None  Intra-op Plan:   Post-operative Plan:   Informed Consent: I have reviewed the patients History and Physical, chart, labs and discussed the procedure including the risks, benefits and alternatives for the proposed anesthesia with the patient or authorized representative who has indicated his/her understanding and acceptance.     Dental advisory given  Plan Discussed with: CRNA and Anesthesiologist  Anesthesia Plan Comments:         Anesthesia Quick Evaluation

## 2020-12-14 NOTE — Anesthesia Procedure Notes (Addendum)
Procedure Name: General with mask airway Date/Time: 12/14/2020 2:09 PM Performed by: Rande Brunt, CRNA Pre-anesthesia Checklist: Patient identified, Emergency Drugs available, Suction available, Patient being monitored and Timeout performed Patient Re-evaluated:Patient Re-evaluated prior to induction Oxygen Delivery Method: Nasal cannula Preoxygenation: Pre-oxygenation with 100% oxygen Induction Type: IV induction Airway Equipment and Method: Bite block Placement Confirmation: positive ETCO2 and CO2 detector Dental Injury: Teeth and Oropharynx as per pre-operative assessment

## 2020-12-15 ENCOUNTER — Encounter (HOSPITAL_COMMUNITY): Payer: Self-pay | Admitting: Internal Medicine

## 2020-12-15 NOTE — Progress Notes (Signed)
EPIC Encounter for ICM Monitoring  Patient Name: Gary Kirk is a 55 y.o. male Date: 12/15/2020 Primary Care Physican: Neale Burly, MD Primary Cardiologist: Dayna Ramus, NP Electrophysiologist: Audry Riles Pacing: 82.7%   12/06/2020 Weight: 347 lbs   Clinical Status (07-Dec-2020 to 14-Dec-2020) Time in AT/AF 24.0 hr/day (100.0%)          Transmission reviewed.   Optivol thoracic impedance suggesting fluid levels returned to normal.   Prescribed: Furosemide 20 mg take 1 tablet daily.   Labs: 10/22/2020 Creatinine 1.44, BUN 22, Potassium 3.1, Sodium 137, GFR 55->60 07/16/2020 Creatinine 1.10, BUN 10, Potassium 3.7, Sodium 139, GFR 76-88  A complete set of results can be found in Results Review.   Recommendations:  No changes.   Follow-up plan: ICM clinic phone appointment on 01/10/2021.  91 day device clinic remote transmission 02/11/2021.     EP/Cardiology Office Visits:  04/07/2021 with Dr. Harl Bowie.  07/01/2021 with Dr Rayann Heman.   Copy of ICM check sent to Dr. Rayann Heman.       3 month ICM trend: 12/14/2020.    1 Year ICM trend:       Rosalene Billings, RN 12/15/2020 3:28 PM

## 2020-12-28 ENCOUNTER — Ambulatory Visit (HOSPITAL_COMMUNITY)
Admission: RE | Admit: 2020-12-28 | Discharge: 2020-12-28 | Disposition: A | Discharge status: Home / Self Care | Payer: Medicaid Other | Source: Ambulatory Visit | Attending: Nurse Practitioner | Admitting: Nurse Practitioner

## 2020-12-28 ENCOUNTER — Other Ambulatory Visit: Payer: Self-pay

## 2020-12-28 VITALS — BP 122/78 | HR 83 | Ht 72.0 in | Wt 342.8 lb

## 2020-12-28 DIAGNOSIS — I110 Hypertensive heart disease with heart failure: Secondary | ICD-10-CM | POA: Insufficient documentation

## 2020-12-28 DIAGNOSIS — Z5982 Transportation insecurity: Secondary | ICD-10-CM | POA: Diagnosis not present

## 2020-12-28 DIAGNOSIS — I4819 Other persistent atrial fibrillation: Secondary | ICD-10-CM | POA: Diagnosis present

## 2020-12-28 DIAGNOSIS — E039 Hypothyroidism, unspecified: Secondary | ICD-10-CM | POA: Insufficient documentation

## 2020-12-28 DIAGNOSIS — Z7901 Long term (current) use of anticoagulants: Secondary | ICD-10-CM | POA: Insufficient documentation

## 2020-12-28 DIAGNOSIS — D6869 Other thrombophilia: Secondary | ICD-10-CM | POA: Diagnosis not present

## 2020-12-28 DIAGNOSIS — G4733 Obstructive sleep apnea (adult) (pediatric): Secondary | ICD-10-CM | POA: Diagnosis not present

## 2020-12-28 DIAGNOSIS — Z87891 Personal history of nicotine dependence: Secondary | ICD-10-CM | POA: Diagnosis not present

## 2020-12-28 DIAGNOSIS — I5043 Acute on chronic combined systolic (congestive) and diastolic (congestive) heart failure: Secondary | ICD-10-CM | POA: Insufficient documentation

## 2020-12-28 DIAGNOSIS — Z91128 Patient's intentional underdosing of medication regimen for other reason: Secondary | ICD-10-CM | POA: Diagnosis not present

## 2020-12-28 DIAGNOSIS — Z95810 Presence of automatic (implantable) cardiac defibrillator: Secondary | ICD-10-CM | POA: Diagnosis not present

## 2020-12-28 DIAGNOSIS — I2510 Atherosclerotic heart disease of native coronary artery without angina pectoris: Secondary | ICD-10-CM | POA: Diagnosis not present

## 2020-12-28 DIAGNOSIS — I428 Other cardiomyopathies: Secondary | ICD-10-CM | POA: Diagnosis not present

## 2020-12-28 NOTE — Progress Notes (Addendum)
Primary Care Physician: Neale Burly, MD Referring Physician: Oda Kilts, PA/Dr. [facility], Dr. Dayna Ramus, NP   Gary Kirk is a 55 y.o. male with a h/o morbid obesity, HTN, non-ischemic cardiomyopathy, acute on chronic systolic/diastolic HF, non-occlusive CAD, BiV ICD, daily marijuana use, OSA, not treated (sleep study many years ago) in the afib clinic  to f/u recent TEE DCCV that patient would not shock out. He had seen Oda Kilts, PA,  prior to this for persistent afib x 3-5 months. He does not drive and is here with his SIL that helps with transportation from the [place] area. He states that he has no energy and is short of breath with activity. He has been non compliant with meds in the past, most recently admitting to [facility] that he would only take his meds 4-5 days a week including eliquis. No alcohol, drinks (2) 2L soft drinks a week. No exercise program. He is here to discuss antiarrythmic's. With his current lifestyle, untreated sleep apnea, morbid obesity, it may be difficult to restore and maintain sinus rhythm. It will also be a challenge to  treat him primarily  here in [place] with transportation issues.    Today, he denies symptoms of palpitations, chest pain, shortness of breath, orthopnea, PND, lower extremity edema, dizziness, presyncope, syncope, or neurologic sequela. The patient is tolerating medications without difficulties and is otherwise without complaint today.   Past Medical History:  Diagnosis Date   AICD (automatic cardioverter/defibrillator) present    a. 10/2013 s/p MDT [id number] BiV ICD (ser# [id number]).   COPD (chronic obstructive pulmonary disease) ([place])    a. Quit smoking in 2015.   HFrEF (heart failure with reduced ejection fraction) ([place])    a. 02/2012 Echo: EF "depressed";  b. 03/2013 Echo: Ef 15%, mod to sev LV dil w/ anteroseptal AK and sev glob HK. Dilated RV w/ reduced fxn. Mild to mod RAE, Sev LAE. Mild TR; c. 11/2013 Echo: EF  10-15%, redcued RV fxn, mild BAE, mild TR.   History of ileus    Hypertension    Hypokalemia    Hypothyroidism    Major depression    NICM (nonischemic cardiomyopathy) ([place])    a. 02/2012 Echo: depressed EF; b. 02/2012 MV: EF 38%, small fixed apical defect, no ischemia; c. 03/2013 Echo: EF 15%; d. 08/2013 MV: apical ant, septal inf fixed defects, no ischemia; e. 10/2013 s/p MDT [id number] BiV ICD; d. 11/2013 Cath: results not avail, reportedly nl cors; e. 09/2014 MV: Fixed inf defect consistent w/ diaphragmatic atten. No ischemia. EF 28%.   Persistent atrial fibrillation (HCC)    a. CHA2DS2VASc = 2-->Eliquis.   Sleep apnea    a. Doesn't tolerate CPAP.   Past Surgical History:  Procedure Laterality Date   BIV ICD GENERATOR CHANGEOUT N/A 07/21/2019   Procedure: BIV ICD GENERATOR CHANGEOUT;  Surgeon: Deboraha Sprang, MD;  Location: [facility];  Service: Cardiovascular;  Laterality: N/A;   CARDIAC DEFIBRILLATOR PLACEMENT     CARDIOVERSION N/A 12/14/2020   Procedure: CARDIOVERSION;  Surgeon: Fay Records, MD;  Location: [facility] ENDOSCOPY;  Service: Cardiovascular;  Laterality: N/A;   CATARACT EXTRACTION W/PHACO Left 09/20/2020   Procedure: CATARACT EXTRACTION PHACO AND INTRAOCULAR LENS PLACEMENT ([place]);  Surgeon: Baruch Goldmann, MD;  Location: [facility];  Service: Ophthalmology;  Laterality: Left;  CDE   6.21   PACEMAKER IMPLANT     SHOULDER DEBRIDEMENT Left    TEE WITHOUT CARDIOVERSION N/A 12/14/2020   Procedure: TRANSESOPHAGEAL ECHOCARDIOGRAM (  TEE);  Surgeon: Fay Records, MD;  Location: [facility] ENDOSCOPY;  Service: Cardiovascular;  Laterality: N/A;    Current Outpatient Medications  Medication Sig Dispense Refill   acetaminophen (TYLENOL) 500 MG tablet Take 1,000 mg by mouth every 6 (six) hours as needed for moderate pain or headache.     apixaban (ELIQUIS) 5 MG TABS tablet Take 5 mg by mouth 2 (two) times daily.     carvedilol (COREG) 12.5 MG tablet Take 1 tablet (12.5 mg total) by mouth 2 (two)  times daily with a meal. 60 tablet 0   famotidine (PEPCID) 40 MG tablet Take 40 mg by mouth daily.     furosemide (LASIX) 20 MG tablet Take 1 tablet (20 mg total) by mouth daily.     gabapentin (NEURONTIN) 300 MG capsule Take 300 mg by mouth 3 (three) times daily.      levothyroxine (SYNTHROID) 112 MCG tablet Take 112 mcg by mouth daily before breakfast.     PROAIR HFA 108 (90 Base) MCG/ACT inhaler Inhale 2 puffs into the lungs every 4 (four) hours as needed for wheezing or shortness of breath.   3   sertraline (ZOLOFT) 50 MG tablet Take 50 mg by mouth daily.     SPIRIVA HANDIHALER 18 MCG inhalation capsule Place 18 mcg into inhaler and inhale daily.     SYMBICORT 160-4.5 MCG/ACT inhaler Inhale 2 puffs into the lungs in the morning and at bedtime.     No current facility-administered medications for this encounter.    Allergies  Allergen Reactions   Aripiprazole Other (See Comments)    Right arm shaking    Tramadol Shortness Of Breath and Nausea Only    Pt states he has "flu type symptoms" after receiving tramadol    Ibuprofen Other (See Comments)    "Bleeding ulcers" per pt Spits up blood     Social History   Socioeconomic History   Marital status: Legally Separated    Spouse name: Not on file   Number of children: Not on file   Years of education: Not on file   Highest education level: Not on file  Occupational History   Not on file  Tobacco Use   Smoking status: Former    Packs/day: 2.00    Years: 30.00    Pack years: 60.00    Types: Cigarettes    Quit date: 04/29/2014    Years since quitting: 6.6   Smokeless tobacco: Never  Vaping Use   Vaping Use: Never used  Substance and Sexual Activity   Alcohol use: No   Drug use: Yes    Types: Marijuana    Comment: last use last night   Sexual activity: Yes  Other Topics Concern   Not on file  Social History Narrative   Not on file   Social Determinants of Health   Financial Resource Strain: Not on file  Food  Insecurity: Not on file  Transportation Needs: Not on file  Physical Activity: Not on file  Stress: Not on file  Social Connections: Not on file  Intimate Partner Violence: Not on file    Family History  Problem Relation Age of Onset   COPD Mother        decsd 2008   Hypertension Mother    Atrial fibrillation Mother    Heart failure Mother    Cancer Father        unknown cancer, died when patient was 33   Lung cancer Brother    Atrial  fibrillation Brother     ROS- All systems are reviewed and negative except as per the HPI above  Physical Exam: Vitals:   12/28/20 1342  BP: 122/78  Pulse: 83  Weight: (!) 155.5 kg  Height: 6' (1.829 m)   Wt Readings from Last 3 Encounters:  12/28/20 (!) 155.5 kg  12/14/20 (!) 155.8 kg  12/07/20 (!) 155.8 kg    Labs: Lab Results  Component Value Date   NA 138 12/07/2020   K 4.1 12/07/2020   CL 99 12/07/2020   CO2 21 12/07/2020   GLUCOSE 75 12/07/2020   BUN 7 12/07/2020   CREATININE 1.07 12/07/2020   CALCIUM 9.6 12/07/2020   MG 1.8 05/09/2016   Lab Results  Component Value Date   INR 1.0 10/23/2019   No results found for: CHOL, HDL, LDLCALC, TRIG   GEN- The patient is well appearing, alert and oriented x 3 today.   Head- normocephalic, atraumatic Eyes-  Sclera clear, conjunctiva pink Ears- hearing intact Oropharynx- clear Neck- supple, no JVP Lymph- no cervical lymphadenopathy Lungs- Clear to ausculation bilaterally, normal work of breathing Heart- Regular rate and rhythm, no murmurs, rubs or gallops, PMI not laterally displaced GI- soft, NT, ND, + BS Extremities- no clubbing, cyanosis, or edema MS- no significant deformity or atrophy Skin- no rash or lesion Psych- euthymic mood, full affect Neuro- strength and sensation are intact  EKG-v-paced rhythm, qrs int 158 ms, qtc 521 ms  Epic records reviewed Echo-02/2019-Study Result    ECHOCARDIOGRAM REPORT         Patient Name:   Gary Kirk Date of Exam:  03/13/2019  Medical Rec #:  [id number]         Height:       73.0 in  Accession #:    [id number]        Weight:       308.0 lb  Date of Birth:  1965-06-17          BSA:          2.59 m  Patient Age:    20 years          BP:           163/86 mmHg  Patient Gender: M                 HR:           70 bpm.  Exam Location:  [address]   Procedure: 2D Echo, Cardiac Doppler, Color Doppler, 3D Echo and Strain  Analysis   Indications:    I48.91 Atrial fibrillation with RVR     History:        Patient has no prior history of Echocardiogram  examinations.                  Nonischemic Cardiomyopathy, Pacemaker, COPD; Risk                  Factors:Hypertension and Sleep Apnea.     Sonographer:    Marygrace Drought RCS  Referring Phys: [id number] [place]     1. Left ventricular ejection fraction, by visual estimation, is 55 to  60%. The left ventricle has normal function. There is no left ventricular  hypertrophy.   2. The left ventricle demonstrates global hypokinesis.   3. Global right ventricle has normal systolic function.The right  ventricular size is normal. No increase in right ventricular wall  thickness.   4. Left  atrial size was normal.   5. Right atrial size was normal.   6. The mitral valve is normal in structure. Trivial mitral valve  regurgitation.   7. The tricuspid valve is grossly normal.   8. The aortic valve is tricuspid. Aortic valve regurgitation is not  visualized. No evidence of aortic valve sclerosis or stenosis.   9. The pulmonic valve was not well visualized. Pulmonic valve  regurgitation is not visualized.  10. Aortic dilatation noted.  11. There is mild dilatation of the ascending aorta measuring 39 mm.  12. A pacer wire is visualized in the RA and RV.  13. The inferior vena cava is normal in size with greater than 50%  respiratory variability, suggesting right atrial pressure of 3 mmHg.     TEE-1. Left ventricular ejection fraction, by  estimation, is 45 to 50%. The  left ventricle has mildly decreased function.   2. Right ventricular systolic function is normal. The right ventricular  size is normal.   3. No left atrial/left atrial appendage thrombus was detected.   4. Right atrial size was mildly dilated.   5. The mitral valve is normal in structure. Mild mitral valve  regurgitation.   6. The aortic valve is normal in structure. Aortic valve regurgitation is  not visualized.   7. There is mild (Grade II) plaque.   Assessment and Plan:  1. Symptomatic persistent afib  Per pt present for 2-5 months Here to discuss means of restoring SR This may be difficult with pt's lifestyle, medication noncompliance, morbid obesity, untreated sleep apnea, transportation issues 2/2 as he does not drive  and lives in [place]  I discussed with Dr. Rayann Heman I am concerned using tikosyn and his history of medical noncompliance plus that would mean many trips to [place] to get on drug and for surveillance of drug I do not feel flecainide is a good choice  with his h/o of heart failure and EF by TEE is 45%. I feel amiodarone may be the best choice for now as pt seems motivated to do better with meds. He states he has not missed any of his anticoagulation since he saw Oda Kilts, 10/11 I have also had conversations recently with the [employer] rep that pt can be transitioned off amiodarone  to Multaq without washout  if he can maintain SR for 6-9 months to  prevent long term use   2. BMI 46.49 kg/m If he can lose wight and change his lifestyle, he may be a candidate down the line for an ablation, but is not a good candidate now weighing  342 lbs.   3. OSA Untreated sleep apnea will severely undermine ability to maintain SR  The sister-in -law will help him contact his PCP to repeat sleep study as he is showing some interest in treating sleep apnea now   4. Lifestyle issues  He will reduce caffeine intake He is currently drinking 2 2L soft  drinks a day This may also assist his weight loss efforts  Daily exercise is also recommended  Encouraged to stop daily marijuana use as this can promote afib as well   5. Hypothyroidism  He will need his thyroid followed closely with start of amiodarone  I discussed with sister in law to start amiodarone 200 mg 2 in am and 2 in pm x 5 days then reduce 200 mg bid until after cardioversion   He can have f/u ekg done  in the [facility] office with baseline TSH/Cmet and  I will see back in 3 weeks to get scheduled for cardioversion   The sister in law did tell me t on the phone that the pt did commented that he didn't know if he wanted to make all the changes I outlined yesterday, reinforcing  my decision not to use Tikosyn with him. I again stressed to the SIL to stress to pt not to miss his eliquis 5 mg bid with another anticipated cardioversion pending    Geroge Baseman. Chiara Coltrin, [facility] [address] [place], [place] [address] [phone]

## 2020-12-29 ENCOUNTER — Other Ambulatory Visit (HOSPITAL_COMMUNITY): Payer: Self-pay | Admitting: *Deleted

## 2020-12-29 ENCOUNTER — Ambulatory Visit: Payer: Medicaid Other

## 2020-12-29 ENCOUNTER — Encounter (HOSPITAL_COMMUNITY): Payer: Self-pay | Admitting: Nurse Practitioner

## 2020-12-29 MED ORDER — AMIODARONE HCL 200 MG PO TABS: ORAL_TABLET | ORAL | 0 refills | Status: DC

## 2021-01-06 ENCOUNTER — Ambulatory Visit (INDEPENDENT_AMBULATORY_CARE_PROVIDER_SITE_OTHER): Payer: Medicaid Other | Admitting: *Deleted

## 2021-01-06 ENCOUNTER — Other Ambulatory Visit: Payer: Self-pay

## 2021-01-06 DIAGNOSIS — I4819 Other persistent atrial fibrillation: Secondary | ICD-10-CM

## 2021-01-06 NOTE — Patient Instructions (Signed)
You will be contacted by the A-Fib Clinic Continue same plan for now

## 2021-01-06 NOTE — Progress Notes (Signed)
Continues in v paced rhythm on amiodarone. Has f/u with me 11/22. Thank you.

## 2021-01-06 NOTE — Progress Notes (Signed)
Presents for nurse visit per last OV in [facility]. EKG done and routed to Roderic Palau for review

## 2021-01-10 ENCOUNTER — Ambulatory Visit (INDEPENDENT_AMBULATORY_CARE_PROVIDER_SITE_OTHER): Payer: Medicaid Other

## 2021-01-10 DIAGNOSIS — I5022 Chronic systolic (congestive) heart failure: Secondary | ICD-10-CM | POA: Diagnosis not present

## 2021-01-10 DIAGNOSIS — Z95810 Presence of automatic (implantable) cardiac defibrillator: Secondary | ICD-10-CM

## 2021-01-14 NOTE — Progress Notes (Signed)
EPIC Encounter for ICM Monitoring  Patient Name: Gary Kirk is a 55 y.o. male Date: 01/14/2021 Primary Care Physican: Neale Burly, MD Primary Cardiologist: Dayna Ramus, NP Electrophysiologist: Audry Riles Pacing: 86.3%   12/06/2020 Weight: 347 lbs   Since 14-Dec-2020 Time in AT/AF 24.0 hr/day (100.0%) Longest AT/AF 120 days          Spoke with patient and heart failure questions reviewed.  Pt asymptomatic for fluid accumulation but does have some shortness of breath from afib.   Optivol thoracic impedance normal but was suggesting possible fluid accumulation from 11/1-11/7.   Prescribed: Furosemide 20 mg take 1 tablet daily.   Labs: 10/22/2020 Creatinine 1.44, BUN 22, Potassium 3.1, Sodium 137, GFR 55->60 07/16/2020 Creatinine 1.10, BUN 10, Potassium 3.7, Sodium 139, GFR 76-88  A complete set of results can be found in Results Review.   Recommendations: No changes and encouraged to call if experiencing any fluid symptoms.   Follow-up plan: ICM clinic phone appointment on 02/14/2021.  91 day device clinic remote transmission 02/11/2021.     EP/Cardiology Office Visits:  01/18/2021 with [facility] Palau [facility].  04/07/2021 with Dr. Harl Bowie.  07/01/2021 with Dr Rayann Heman.   Copy of ICM check sent to Dr. Rayann Heman.     3 month ICM trend: 01/10/2021.    12-14 Month ICM trend:       Rosalene Billings, RN 01/14/2021 2:14 PM

## 2021-01-18 ENCOUNTER — Ambulatory Visit (HOSPITAL_COMMUNITY): Payer: Medicaid Other | Admitting: Nurse Practitioner

## 2021-01-20 ENCOUNTER — Other Ambulatory Visit (HOSPITAL_COMMUNITY): Payer: Self-pay | Admitting: Nurse Practitioner

## 2021-01-27 ENCOUNTER — Ambulatory Visit (HOSPITAL_COMMUNITY): Payer: Medicaid Other | Admitting: Nurse Practitioner

## 2021-02-01 ENCOUNTER — Ambulatory Visit (HOSPITAL_COMMUNITY): Payer: Medicaid Other | Admitting: Nurse Practitioner

## 2021-02-02 ENCOUNTER — Encounter (HOSPITAL_COMMUNITY): Payer: Self-pay | Admitting: *Deleted

## 2021-02-03 ENCOUNTER — Encounter: Payer: Self-pay | Admitting: Gastroenterology

## 2021-02-03 NOTE — Progress Notes (Deleted)
Referring Provider: Neale Burly, MD Primary Care Physician:  Neale Burly, MD Primary Gastroenterologist:  Dr. Rayne Du chief complaint on file.   HPI:   Gary Kirk is a 55 y.o. male presenting today at the request of Hasanaj, Samul Dada, MD for consult colonoscopy.  Office visit due to BMI and Eliquis.  Patient has history significant for chronic systolic heart failure with ICD, atrial fibrillation on Eliquis, nonischemic cardiomyopathy, OSA, COPD, HTN, and hypothyroidism.   Per chart review, he had a mildly nodular liver on CT with contrast in 2020, spleen enlarged, findings concerning for cirrhosis with underlying portal hypertension.    Today:    Past Medical History:  Diagnosis Date   AICD (automatic cardioverter/defibrillator) present    a. 10/2013 s/p MDT [id number] BiV ICD (ser# [id number]).   COPD (chronic obstructive pulmonary disease) ([place])    a. Quit smoking in 2015.   HFrEF (heart failure with reduced ejection fraction) ([place])    a. 02/2012 Echo: EF "depressed";  b. 03/2013 Echo: Ef 15%, mod to sev LV dil w/ anteroseptal AK and sev glob HK. Dilated RV w/ reduced fxn. Mild to mod RAE, Sev LAE. Mild TR; c. 11/2013 Echo: EF 10-15%, redcued RV fxn, mild BAE, mild TR.   History of ileus    Hypertension    Hypokalemia    Hypothyroidism    Major depression    NICM (nonischemic cardiomyopathy) ([place])    a. 02/2012 Echo: depressed EF; b. 02/2012 MV: EF 38%, small fixed apical defect, no ischemia; c. 03/2013 Echo: EF 15%; d. 08/2013 MV: apical ant, septal inf fixed defects, no ischemia; e. 10/2013 s/p MDT [id number] BiV ICD; d. 11/2013 Cath: results not avail, reportedly nl cors; e. 09/2014 MV: Fixed inf defect consistent w/ diaphragmatic atten. No ischemia. EF 28%.   Persistent atrial fibrillation (HCC)    a. CHA2DS2VASc = 2-->Eliquis.   Sleep apnea    a. Doesn't tolerate CPAP.    Past Surgical History:  Procedure Laterality Date   BIV ICD GENERATOR CHANGEOUT N/A 07/21/2019    Procedure: BIV ICD GENERATOR CHANGEOUT;  Surgeon: Deboraha Sprang, MD;  Location: [facility];  Service: Cardiovascular;  Laterality: N/A;   CARDIAC DEFIBRILLATOR PLACEMENT     CARDIOVERSION N/A 12/14/2020   Procedure: CARDIOVERSION;  Surgeon: Fay Records, MD;  Location: [facility] ENDOSCOPY;  Service: Cardiovascular;  Laterality: N/A;   CATARACT EXTRACTION W/PHACO Left 09/20/2020   Procedure: CATARACT EXTRACTION PHACO AND INTRAOCULAR LENS PLACEMENT ([place]);  Surgeon: Baruch Goldmann, MD;  Location: [facility];  Service: Ophthalmology;  Laterality: Left;  CDE   6.21   PACEMAKER IMPLANT     SHOULDER DEBRIDEMENT Left    TEE WITHOUT CARDIOVERSION N/A 12/14/2020   Procedure: TRANSESOPHAGEAL ECHOCARDIOGRAM (TEE);  Surgeon: Fay Records, MD;  Location: [facility] ENDOSCOPY;  Service: Cardiovascular;  Laterality: N/A;    Current Outpatient Medications  Medication Sig Dispense Refill   acetaminophen (TYLENOL) 500 MG tablet Take 1,000 mg by mouth every 6 (six) hours as needed for moderate pain or headache.     amiodarone (PACERONE) 200 MG tablet Take 1 tablet by mouth twice a day for 1 month then reduce to 1 tablet daily 60 tablet 0   apixaban (ELIQUIS) 5 MG TABS tablet Take 5 mg by mouth 2 (two) times daily.     carvedilol (COREG) 12.5 MG tablet Take 1 tablet (12.5 mg total) by mouth 2 (two) times daily with a meal. 60 tablet 0  famotidine (PEPCID) 40 MG tablet Take 40 mg by mouth daily.     furosemide (LASIX) 20 MG tablet Take 1 tablet (20 mg total) by mouth daily.     gabapentin (NEURONTIN) 300 MG capsule Take 300 mg by mouth 3 (three) times daily.      levothyroxine (SYNTHROID) 112 MCG tablet Take 112 mcg by mouth daily before breakfast.     PROAIR HFA 108 (90 Base) MCG/ACT inhaler Inhale 2 puffs into the lungs every 4 (four) hours as needed for wheezing or shortness of breath.   3   sertraline (ZOLOFT) 50 MG tablet Take 50 mg by mouth daily.     SPIRIVA HANDIHALER 18 MCG inhalation capsule Place 18 mcg into  inhaler and inhale daily.     SYMBICORT 160-4.5 MCG/ACT inhaler Inhale 2 puffs into the lungs in the morning and at bedtime.     No current facility-administered medications for this visit.    Allergies as of 02/04/2021 - Review Complete 12/29/2020  Allergen Reaction Noted   Aripiprazole Other (See Comments) 05/08/2016   Tramadol Shortness Of Breath and Nausea Only 04/01/2013   Ibuprofen Other (See Comments) 04/01/2013    Family History  Problem Relation Age of Onset   COPD Mother        decsd 2008   Hypertension Mother    Atrial fibrillation Mother    Heart failure Mother    Cancer Father        unknown cancer, died when patient was 26   Lung cancer Brother    Atrial fibrillation Brother     Social History   Socioeconomic History   Marital status: Legally Separated    Spouse name: Not on file   Number of children: Not on file   Years of education: Not on file   Highest education level: Not on file  Occupational History   Not on file  Tobacco Use   Smoking status: Former    Packs/day: 2.00    Years: 30.00    Pack years: 60.00    Types: Cigarettes    Quit date: 04/29/2014    Years since quitting: 6.7   Smokeless tobacco: Never  Vaping Use   Vaping Use: Never used  Substance and Sexual Activity   Alcohol use: No   Drug use: Yes    Types: Marijuana    Comment: last use last night   Sexual activity: Yes  Other Topics Concern   Not on file  Social History Narrative   Not on file   Social Determinants of Health   Financial Resource Strain: Not on file  Food Insecurity: Not on file  Transportation Needs: Not on file  Physical Activity: Not on file  Stress: Not on file  Social Connections: Not on file  Intimate Partner Violence: Not on file    Review of Systems: Gen: Denies any fever, chills, fatigue, weight loss, lack of appetite.  CV: Denies chest pain, heart palpitations, peripheral edema, syncope.  Resp: Denies shortness of breath at rest or with  exertion. Denies wheezing or cough.  GI: Denies dysphagia or odynophagia. Denies jaundice, hematemesis, fecal incontinence. GU : Denies urinary burning, urinary frequency, urinary hesitancy MS: Denies joint pain, muscle weakness, cramps, or limitation of movement.  Derm: Denies rash, itching, dry skin Psych: Denies depression, anxiety, memory loss, and confusion Heme: Denies bruising, bleeding, and enlarged lymph nodes.  Physical Exam: There were no vitals taken for this visit. General:   Alert and oriented. Pleasant and cooperative. Well-nourished  and well-developed.  Head:  Normocephalic and atraumatic. Eyes:  Without icterus, sclera clear and conjunctiva pink.  Ears:  Normal auditory acuity. Nose:  No deformity, discharge,  or lesions. Mouth:  No deformity or lesions, oral mucosa pink.  Neck:  Supple, without mass or thyromegaly. Lungs:  Clear to auscultation bilaterally. No wheezes, rales, or rhonchi. No distress.  Heart:  S1, S2 present without murmurs appreciated.  Abdomen:  +BS, soft, non-tender and non-distended. No HSM noted. No guarding or rebound. No masses appreciated.  Rectal:  Deferred  Msk:  Symmetrical without gross deformities. Normal posture. Pulses:  Normal pulses noted. Extremities:  Without clubbing or edema. Neurologic:  Alert and  oriented x4;  grossly normal neurologically. Skin:  Intact without significant lesions or rashes. Cervical Nodes:  No significant cervical adenopathy. Psych:  Alert and cooperative. Normal mood and affect.

## 2021-02-04 ENCOUNTER — Ambulatory Visit: Payer: Medicaid Other | Admitting: Gastroenterology

## 2021-02-04 ENCOUNTER — Encounter: Payer: Self-pay | Admitting: Internal Medicine

## 2021-02-11 ENCOUNTER — Ambulatory Visit (INDEPENDENT_AMBULATORY_CARE_PROVIDER_SITE_OTHER): Payer: Medicaid Other

## 2021-02-11 DIAGNOSIS — I5022 Chronic systolic (congestive) heart failure: Secondary | ICD-10-CM | POA: Diagnosis not present

## 2021-02-14 ENCOUNTER — Ambulatory Visit (INDEPENDENT_AMBULATORY_CARE_PROVIDER_SITE_OTHER): Payer: Medicaid Other

## 2021-02-14 DIAGNOSIS — I5022 Chronic systolic (congestive) heart failure: Secondary | ICD-10-CM | POA: Diagnosis not present

## 2021-02-14 DIAGNOSIS — Z95810 Presence of automatic (implantable) cardiac defibrillator: Secondary | ICD-10-CM

## 2021-02-14 LAB — CUP PACEART REMOTE DEVICE CHECK
Battery Remaining Longevity: 88 mo
Battery Voltage: 3 V
Brady Statistic RA Percent Paced: 0 %
Brady Statistic RV Percent Paced: 88.61 %
Date Time Interrogation Session: 20221216130947
HighPow Impedance: 96 Ohm
Implantable Lead Implant Date: 20150904
Implantable Lead Implant Date: 20150904
Implantable Lead Implant Date: 20150904
Implantable Lead Location: 753858
Implantable Lead Location: 753859
Implantable Lead Location: 753860
Implantable Lead Model: 4298
Implantable Lead Model: 5076
Implantable Lead Model: 6935
Implantable Pulse Generator Implant Date: 20210524
Lead Channel Impedance Value: 1026 Ohm
Lead Channel Impedance Value: 1121 Ohm
Lead Channel Impedance Value: 1121 Ohm
Lead Channel Impedance Value: 261.164
Lead Channel Impedance Value: 265.661
Lead Channel Impedance Value: 299.908
Lead Channel Impedance Value: 306.303
Lead Channel Impedance Value: 312.507
Lead Channel Impedance Value: 513 Ohm
Lead Channel Impedance Value: 532 Ohm
Lead Channel Impedance Value: 532 Ohm
Lead Channel Impedance Value: 551 Ohm
Lead Channel Impedance Value: 589 Ohm
Lead Channel Impedance Value: 665 Ohm
Lead Channel Impedance Value: 703 Ohm
Lead Channel Impedance Value: 722 Ohm
Lead Channel Impedance Value: 893 Ohm
Lead Channel Impedance Value: 931 Ohm
Lead Channel Pacing Threshold Amplitude: 0.375 V
Lead Channel Pacing Threshold Amplitude: 1.125 V
Lead Channel Pacing Threshold Amplitude: 1.125 V
Lead Channel Pacing Threshold Pulse Width: 0.4 ms
Lead Channel Pacing Threshold Pulse Width: 0.4 ms
Lead Channel Pacing Threshold Pulse Width: 0.4 ms
Lead Channel Sensing Intrinsic Amplitude: 1.25 mV
Lead Channel Sensing Intrinsic Amplitude: 1.25 mV
Lead Channel Sensing Intrinsic Amplitude: 6.75 mV
Lead Channel Sensing Intrinsic Amplitude: 6.75 mV
Lead Channel Setting Pacing Amplitude: 1.5 V
Lead Channel Setting Pacing Amplitude: 1.75 V
Lead Channel Setting Pacing Amplitude: 2.25 V
Lead Channel Setting Pacing Pulse Width: 0.4 ms
Lead Channel Setting Pacing Pulse Width: 0.4 ms
Lead Channel Setting Sensing Sensitivity: 0.3 mV

## 2021-02-16 ENCOUNTER — Telehealth: Payer: Self-pay

## 2021-02-16 NOTE — Progress Notes (Signed)
EPIC Encounter for ICM Monitoring  Patient Name: Gary Kirk is a 55 y.o. male Date: 02/16/2021 Primary Care Physican: Neale Burly, MD Primary Cardiologist: Dayna Ramus, NP Electrophysiologist: Audry Riles Pacing: 88.6%   12/06/2020 Weight: 347 lbs   Since 10-Jan-2021 Time in AT/AF  24.0 hr/day (100.0%) Longest AT/AF   5 months         Attempted call to patient and unable to reach.  Transmission reviewed.    Optivol thoracic impedance normal suggesting possible fluid accumulation starting 11/1 with exception of few days at baseline and returned close to baseline 12/19   Prescribed: Furosemide 20 mg take 1 tablet daily.   Labs: 12/07/2020 Creatinine 1.07, BUN 7,   Potassium 4.1, Sodium 138, GFR 81 10/22/2020 Creatinine 1.44, BUN 22, Potassium 3.1, Sodium 137, GFR 55->60 07/16/2020 Creatinine 1.10, BUN 10, Potassium 3.7, Sodium 139, GFR 76-88  A complete set of results can be found in Results Review.   Recommendations:  Unable to reach.     Follow-up plan: ICM clinic phone appointment on 03/21/2021.  91 day device clinic remote transmission 05/13/2021.     EP/Cardiology Office Visits:   04/07/2021 with Dr. Harl Bowie.  07/01/2021 with Dr Rayann Heman.   Copy of ICM check sent to Dr. Rayann Heman.     3 month ICM trend: 02/11/2021.    12-14 Month ICM trend:       Rosalene Billings, RN 02/16/2021 2:29 PM

## 2021-02-16 NOTE — Telephone Encounter (Signed)
Remote ICM transmission received.  Attempted call to patient regarding ICM remote transmission and no answer.  

## 2021-02-18 ENCOUNTER — Other Ambulatory Visit (HOSPITAL_COMMUNITY): Payer: Self-pay | Admitting: Nurse Practitioner

## 2021-02-23 NOTE — Progress Notes (Signed)
Remote ICD transmission.   

## 2021-03-19 ENCOUNTER — Other Ambulatory Visit (HOSPITAL_COMMUNITY): Payer: Self-pay | Admitting: Nurse Practitioner

## 2021-03-21 ENCOUNTER — Telehealth (HOSPITAL_COMMUNITY): Payer: Self-pay

## 2021-03-21 NOTE — Telephone Encounter (Signed)
Contacted patient and scheduled follow up appointment on 2/7 @ 3:00pm- Roderic Palau NP. Sent in Amiodarone 200mg  tablet, taking one tablet daily, #30 with no refills. Patient given directions and he verbalized understanding.

## 2021-03-22 NOTE — Progress Notes (Signed)
No ICM remote transmission received for 03/21/2021 and next ICM transmission scheduled for 04/05/2021.

## 2021-04-04 ENCOUNTER — Telehealth: Payer: Self-pay

## 2021-04-04 NOTE — Telephone Encounter (Signed)
Spoke with patient and requested to send remote transmission to check monthly fluid levels.  He will send tomorrow morning for review.

## 2021-04-05 ENCOUNTER — Ambulatory Visit (HOSPITAL_COMMUNITY)
Admission: RE | Admit: 2021-04-05 | Discharge: 2021-04-05 | Disposition: A | Discharge status: Home / Self Care | Payer: Medicaid Other | Source: Ambulatory Visit | Attending: Nurse Practitioner | Admitting: Nurse Practitioner

## 2021-04-05 ENCOUNTER — Encounter (HOSPITAL_COMMUNITY): Payer: Self-pay | Admitting: Nurse Practitioner

## 2021-04-05 ENCOUNTER — Ambulatory Visit (INDEPENDENT_AMBULATORY_CARE_PROVIDER_SITE_OTHER): Payer: Medicaid Other

## 2021-04-05 ENCOUNTER — Other Ambulatory Visit: Payer: Self-pay

## 2021-04-05 VITALS — BP 146/86 | HR 74 | Ht 72.0 in | Wt 347.0 lb

## 2021-04-05 DIAGNOSIS — I4819 Other persistent atrial fibrillation: Secondary | ICD-10-CM | POA: Insufficient documentation

## 2021-04-05 DIAGNOSIS — I5022 Chronic systolic (congestive) heart failure: Secondary | ICD-10-CM | POA: Diagnosis not present

## 2021-04-05 DIAGNOSIS — Z95810 Presence of automatic (implantable) cardiac defibrillator: Secondary | ICD-10-CM

## 2021-04-05 DIAGNOSIS — I442 Atrioventricular block, complete: Secondary | ICD-10-CM | POA: Insufficient documentation

## 2021-04-05 DIAGNOSIS — Z9114 Patient's other noncompliance with medication regimen: Secondary | ICD-10-CM | POA: Insufficient documentation

## 2021-04-05 DIAGNOSIS — I110 Hypertensive heart disease with heart failure: Secondary | ICD-10-CM | POA: Diagnosis not present

## 2021-04-05 DIAGNOSIS — Z6841 Body Mass Index (BMI) 40.0 and over, adult: Secondary | ICD-10-CM | POA: Diagnosis not present

## 2021-04-05 DIAGNOSIS — G4733 Obstructive sleep apnea (adult) (pediatric): Secondary | ICD-10-CM | POA: Diagnosis not present

## 2021-04-05 DIAGNOSIS — D6869 Other thrombophilia: Secondary | ICD-10-CM

## 2021-04-05 DIAGNOSIS — I428 Other cardiomyopathies: Secondary | ICD-10-CM | POA: Diagnosis not present

## 2021-04-05 DIAGNOSIS — I2510 Atherosclerotic heart disease of native coronary artery without angina pectoris: Secondary | ICD-10-CM | POA: Insufficient documentation

## 2021-04-05 DIAGNOSIS — Z5982 Transportation insecurity: Secondary | ICD-10-CM | POA: Insufficient documentation

## 2021-04-05 DIAGNOSIS — E039 Hypothyroidism, unspecified: Secondary | ICD-10-CM | POA: Diagnosis not present

## 2021-04-05 DIAGNOSIS — Z7901 Long term (current) use of anticoagulants: Secondary | ICD-10-CM | POA: Diagnosis not present

## 2021-04-05 DIAGNOSIS — I5043 Acute on chronic combined systolic (congestive) and diastolic (congestive) heart failure: Secondary | ICD-10-CM | POA: Diagnosis not present

## 2021-04-05 DIAGNOSIS — F1290 Cannabis use, unspecified, uncomplicated: Secondary | ICD-10-CM | POA: Diagnosis not present

## 2021-04-05 NOTE — Progress Notes (Signed)
EPIC Encounter for ICM Monitoring  Patient Name: Gary Kirk is a 56 y.o. male Date: 04/05/2021 Primary Care Physican: Neale Burly, MD Primary Cardiologist: Dayna Ramus, NP Electrophysiologist: Audry Riles Pacing: 92.96%   04/05/2021 Weight: 347 lbs   Time in AT/AF (100.0%) Longest AT/AF 7 months        Spoke with patient and heart failure questions reviewed.  Pt asymptomatic for fluid accumulation.  He has been drinking more fluid and missed some Furosemide dosages which may contribute to decreased impedance episodes.  He was currently in Afib clinic for follow up.   Optivol thoracic impedance normal but was suggesting possible fluid accumulation from 12/20-1/14 and 1/22-2/4.   Prescribed: Furosemide 20 mg take 1 tablet daily.   Labs: 12/07/2020 Creatinine 1.07, BUN 7,   Potassium 4.1, Sodium 138, GFR 81 10/22/2020 Creatinine 1.44, BUN 22, Potassium 3.1, Sodium 137, GFR 55->60 07/16/2020 Creatinine 1.10, BUN 10, Potassium 3.7, Sodium 139, GFR 76-88  A complete set of results can be found in Results Review.   Recommendations:  Advised to limit fluid intake and take Furosemide on consistent basis to help keep fluid levels stable.     Follow-up plan: ICM clinic phone appointment on 3/132023.  91 day device clinic remote transmission 05/13/2021.     EP/Cardiology Office Visits:   04/07/2021 with Dr. Harl Bowie.  07/01/2021 with Dr Rayann Heman.   Copy of ICM check sent to Dr. Rayann Heman and Dr Harl Bowie as Juluis Rainier for upcoming 2/9 OV.     3 month ICM trend: 04/05/2021.    12-14 Month ICM trend:     Rosalene Billings, RN 04/05/2021 2:31 PM

## 2021-04-05 NOTE — Progress Notes (Addendum)
Primary Care Physician: Neale Burly, MD Referring Physician: Oda Kilts, PA/Dr. [facility], Dr. Carlyle Dolly    Gary Kirk is a 56 y.o. male with a h/o morbid obesity, HTN, non-ischemic cardiomyopathy, acute on chronic systolic/diastolic HF, non-occlusive CAD, BiV ICD, daily marijuana use, OSA, not treated (sleep study many years ago) in the afib clinic  to f/u recent TEE DCCV that patient would not shock out. He had seen Oda Kilts, PA,  prior to this for persistent afib x 3-5 months. He does not drive and is here with his SIL that helps with transportation from the [place] area. He states that he has no energy and is short of breath with activity. He has been non compliant with meds in the past, most recently admitting to [facility] that he would only take his meds 4-5 days a week including eliquis. No alcohol, drinks (2) 2L soft drinks a week. No exercise program. He is here to discuss antiarrythmic's. With his current lifestyle, untreated sleep apnea, morbid obesity, it may be difficult to restore and maintain sinus rhythm. It will also be a challenge to  treat him primarily  here in [place] with transportation issues.   F/u in afib clinic, 04/05/21. He was suppose to come back in December after loading on amiodarone for one month to set up cardioversion but that did not happen. He was dependant on his Brother and SIL to get him to appointments as he does not drive but apparently, there has been a falling out with these family members and he longer wants  them involved in his care or have access to his health records. He is willing to have a cardioversion now but wants this as well as subsequent care in the [place] office as it is only five miles from his home. He does see Dr. Harl Bowie 2/9 and hopefully can get that arranged there. He is v paced today. He reports that he drinks 2 (2L) regular soda a day and he was asked if he can cut this back for the caffeine effect, overall fluid  intake and sugar intake. She was informed by Sharman Cheek today that optivol suggested possible fluid accumulation form 12/20 to 1/24. He has not been taking lasix on a regular basis, this was recommended to take on a regular basis.    Today, he denies symptoms of palpitations, chest pain, shortness of breath, orthopnea, PND, lower extremity edema, dizziness, presyncope, syncope, or neurologic sequela. The patient is tolerating medications without difficulties and is otherwise without complaint today.   Past Medical History:  Diagnosis Date   AICD (automatic cardioverter/defibrillator) present    a. 10/2013 s/p MDT [id number] BiV ICD (ser# [id number]).   COPD (chronic obstructive pulmonary disease) ([place])    a. Quit smoking in 2015.   HFrEF (heart failure with reduced ejection fraction) ([place])    a. 02/2012 Echo: EF "depressed";  b. 03/2013 Echo: Ef 15%, mod to sev LV dil w/ anteroseptal AK and sev glob HK. Dilated RV w/ reduced fxn. Mild to mod RAE, Sev LAE. Mild TR; c. 11/2013 Echo: EF 10-15%, redcued RV fxn, mild BAE, mild TR. ECHO 11/2020 EF 45-50%   History of ileus    Hypertension    Hypokalemia    Hypothyroidism    Major depression    NICM (nonischemic cardiomyopathy) ([place])    a. 02/2012 Echo: depressed EF; b. 02/2012 MV: EF 38%, small fixed apical defect, no ischemia; c. 03/2013 Echo: EF 15%; d. 08/2013 MV: apical ant,  septal inf fixed defects, no ischemia; e. 10/2013 s/p MDT [id number] BiV ICD; d. 11/2013 Cath: results not avail, reportedly nl cors; e. 09/2014 MV: Fixed inf defect consistent w/ diaphragmatic atten. No ischemia. EF 28%.   Persistent atrial fibrillation (HCC)    a. CHA2DS2VASc = 2-->Eliquis.   Sleep apnea    a. Doesn't tolerate CPAP.   Past Surgical History:  Procedure Laterality Date   BIV ICD GENERATOR CHANGEOUT N/A 07/21/2019   Procedure: BIV ICD GENERATOR CHANGEOUT;  Surgeon: Deboraha Sprang, MD;  Location: [facility];  Service: Cardiovascular;  Laterality: N/A;    CARDIAC DEFIBRILLATOR PLACEMENT     CARDIOVERSION N/A 12/14/2020   Procedure: CARDIOVERSION;  Surgeon: Fay Records, MD;  Location: [facility] ENDOSCOPY;  Service: Cardiovascular;  Laterality: N/A;   CATARACT EXTRACTION W/PHACO Left 09/20/2020   Procedure: CATARACT EXTRACTION PHACO AND INTRAOCULAR LENS PLACEMENT ([place]);  Surgeon: Baruch Goldmann, MD;  Location: [facility];  Service: Ophthalmology;  Laterality: Left;  CDE   6.21   PACEMAKER IMPLANT     SHOULDER DEBRIDEMENT Left    TEE WITHOUT CARDIOVERSION N/A 12/14/2020   Procedure: TRANSESOPHAGEAL ECHOCARDIOGRAM (TEE);  Surgeon: Fay Records, MD;  [facility] ENDOSCOPY;  Service: Cardiovascular;  Laterality: N/A;    Current Outpatient Medications  Medication Sig Dispense Refill   acetaminophen (TYLENOL) 500 MG tablet Take 1,000 mg by mouth every 6 (six) hours as needed for moderate pain or headache.     amiodarone (PACERONE) 200 MG tablet TAKE 1 TABLET (200 MG TOTAL) BY MOUTH DAILY. APPOINTMENT REQUIRED FOR FURTHER REFILLS [phone] 30 tablet 0   apixaban (ELIQUIS) 5 MG TABS tablet Take 5 mg by mouth 2 (two) times daily.     carvedilol (COREG) 12.5 MG tablet Take 1 tablet (12.5 mg total) by mouth 2 (two) times daily with a meal. 60 tablet 0   famotidine (PEPCID) 40 MG tablet Take 40 mg by mouth daily.     furosemide (LASIX) 20 MG tablet Take 1 tablet (20 mg total) by mouth daily.     gabapentin (NEURONTIN) 300 MG capsule Take 300 mg by mouth 3 (three) times daily.      levothyroxine (SYNTHROID) 112 MCG tablet Take 112 mcg by mouth daily before breakfast.     PROAIR HFA 108 (90 Base) MCG/ACT inhaler Inhale 2 puffs into the lungs every 4 (four) hours as needed for wheezing or shortness of breath.   3   sertraline (ZOLOFT) 50 MG tablet Take 50 mg by mouth daily.     SPIRIVA HANDIHALER 18 MCG inhalation capsule Place 18 mcg into inhaler and inhale daily.     SYMBICORT 160-4.5 MCG/ACT inhaler Inhale 2 puffs into the lungs in the morning and at bedtime.      No current facility-administered medications for this encounter.    Allergies  Allergen Reactions   Aripiprazole Other (See Comments)    Right arm shaking    Tramadol Shortness Of Breath and Nausea Only    Pt states he has "flu type symptoms" after receiving tramadol    Ibuprofen Other (See Comments)    "Bleeding ulcers" per pt Spits up blood     Social History   Socioeconomic History   Marital status: Legally Separated    Spouse name: Not on file   Number of children: Not on file   Years of education: Not on file   Highest education level: Not on file  Occupational History   Not on file  Tobacco Use  Smoking status: Former    Packs/day: 2.00    Years: 30.00    Pack years: 60.00    Types: Cigarettes    Quit date: 04/29/2014    Years since quitting: 6.9   Smokeless tobacco: Never  Vaping Use   Vaping Use: Never used  Substance and Sexual Activity   Alcohol use: No   Drug use: Yes    Types: Marijuana    Comment: last use last night   Sexual activity: Yes  Other Topics Concern   Not on file  Social History Narrative   Not on file   Social Determinants of Health   Financial Resource Strain: Not on file  Food Insecurity: Not on file  Transportation Needs: Not on file  Physical Activity: Not on file  Stress: Not on file  Social Connections: Not on file  Intimate Partner Violence: Not on file    Family History  Problem Relation Age of Onset   COPD Mother        decsd 2008   Hypertension Mother    Atrial fibrillation Mother    Heart failure Mother    Cancer Father        unknown cancer, died when patient was 66   Lung cancer Brother    Atrial fibrillation Brother     ROS- All systems are reviewed and negative except as per the HPI above  Physical Exam: Vitals:   04/05/21 1444  BP: (!) 146/86  Pulse: 74  Weight: (!) 157.4 kg  Height: 6' (1.829 m)   Wt Readings from Last 3 Encounters:  04/05/21 (!) 157.4 kg  12/28/20 (!) 155.5 kg  12/14/20  (!) 155.8 kg    Labs: Lab Results  Component Value Date   NA 138 12/07/2020   K 4.1 12/07/2020   CL 99 12/07/2020   CO2 21 12/07/2020   GLUCOSE 75 12/07/2020   BUN 7 12/07/2020   CREATININE 1.07 12/07/2020   CALCIUM 9.6 12/07/2020   MG 1.8 05/09/2016   Lab Results  Component Value Date   INR 1.0 10/23/2019   No results found for: CHOL, HDL, LDLCALC, TRIG   GEN- The patient is well appearing, alert and oriented x 3 today.   Head- normocephalic, atraumatic Eyes-  Sclera clear, conjunctiva pink Ears- hearing intact Oropharynx- clear Neck- supple, no JVP Lymph- no cervical lymphadenopathy Lungs- Clear to ausculation bilaterally, normal work of breathing Heart- Regular rate and rhythm, no murmurs, rubs or gallops, PMI not laterally displaced GI- soft, NT, ND, + BS Extremities- no clubbing, cyanosis, or edema MS- no significant deformity or atrophy Skin- no rash or lesion Psych- euthymic mood, full affect Neuro- strength and sensation are intact  EKG-v-paced rhythm,74 bpm, qrs int 160 ms, qtc 521 ms  Epic records reviewed Echo-02/2019-Study Result    ECHOCARDIOGRAM REPORT         Patient Name:   KYROS SALZWEDEL Date of Exam: 03/13/2019  Medical Rec #:  [id number]         Height:       73.0 in  Accession #:    [id number]        Weight:       308.0 lb  Date of Birth:  Apr 27, 1965          BSA:          2.59 m  Patient Age:    51 years          BP:  163/86 mmHg  Patient Gender: M                 HR:           70 bpm.  Exam Location:  [address]   Procedure: 2D Echo, Cardiac Doppler, Color Doppler, 3D Echo and Strain  Analysis   Indications:    I48.91 Atrial fibrillation with RVR     History:        Patient has no prior history of Echocardiogram  examinations.                  Nonischemic Cardiomyopathy, Pacemaker, COPD; Risk                  Factors:Hypertension and Sleep Apnea.     Sonographer:    Marygrace Drought RCS  Referring Phys: [id number] [place]     1. Left ventricular ejection fraction, by visual estimation, is 55 to  60%. The left ventricle has normal function. There is no left ventricular  hypertrophy.   2. The left ventricle demonstrates global hypokinesis.   3. Global right ventricle has normal systolic function.The right  ventricular size is normal. No increase in right ventricular wall  thickness.   4. Left atrial size was normal.   5. Right atrial size was normal.   6. The mitral valve is normal in structure. Trivial mitral valve  regurgitation.   7. The tricuspid valve is grossly normal.   8. The aortic valve is tricuspid. Aortic valve regurgitation is not  visualized. No evidence of aortic valve sclerosis or stenosis.   9. The pulmonic valve was not well visualized. Pulmonic valve  regurgitation is not visualized.  10. Aortic dilatation noted.  11. There is mild dilatation of the ascending aorta measuring 39 mm.  12. A pacer wire is visualized in the RA and RV.  13. The inferior vena cava is normal in size with greater than 50%  respiratory variability, suggesting right atrial pressure of 3 mmHg.     TEE-1. Left ventricular ejection fraction, by estimation, is 45 to 50%. The  left ventricle has mildly decreased function.   2. Right ventricular systolic function is normal. The right ventricular  size is normal.   3. No left atrial/left atrial appendage thrombus was detected.   4. Right atrial size was mildly dilated.   5. The mitral valve is normal in structure. Mild mitral valve  regurgitation.   6. The aortic valve is normal in structure. Aortic valve regurgitation is  not visualized.   7. There is mild (Grade II) plaque.   Assessment and Plan:  1. Symptomatic persistent afib  Per pt present for 6 + months  When seen 12/28/21 AAD's were discussed  It seemed to be  difficult with pt's lifestyle, medication noncompliance, morbid obesity, untreated sleep apnea, transportation issues 2/2  as he does not drive  and lives in [place]  I discussed with Dr. Rayann Heman I am concerned using tikosyn and his history of medical noncompliance plus that would mean many trips to [place] to get on drug and for surveillance of drug I do not feel flecainide is a good choice  with his h/o of heart failure and EF by TEE is 45%. I felt amiodarone was the best choice  as pt seemed motivated to do better with meds.  He did start amio  but failed to f/u for cardioversion in December  He now is now back in  clinic still on amiodarone at 200 mg daily but wants to have cardioversion at [facility] and f/u in the [place] office as he still has transportation issues  He see Dr. Harl Bowie Thursday and hopefully this can be arranged   2. BMI 46.49 kg/m If he can lose wight and change his lifestyle, he may be a candidate down the line for an ablation, but is not a good candidate now weighing  347 lbs. (Gained 5 lbs since last seen here)  3. OSA Untreated sleep apnea will severely undermine ability to maintain SR    4. Lifestyle issues  He will reduce caffeine intake He is currently drinking 2 2L soft drinks a day This may also assist his weight loss efforts and help with overall fluid retention  Daily exercise is also recommended   5. Hypothyroidism  He will need his thyroid followed closely with start of amiodarone Will defer thyroid studies to be drawn along with cardioversion labs with Dr. Harl Bowie   6. CHA2DS2VASc  score of at least 2 States he has not missed any of eliquis 5 mg bid for at least 3 weeks   F/u with  Dr. Harl Bowie 04/07/21  He wishes all of his care be in the [place] office  He does have a ICD check with Dr. Rayann Heman in  [facility] 5/12    Butch Penny C. Matej S[place], [facility] [address] [place], [place] [address] [phone]

## 2021-04-07 ENCOUNTER — Ambulatory Visit (INDEPENDENT_AMBULATORY_CARE_PROVIDER_SITE_OTHER): Payer: Medicaid Other | Admitting: Cardiology

## 2021-04-07 ENCOUNTER — Encounter: Payer: Self-pay | Admitting: Cardiology

## 2021-04-07 ENCOUNTER — Encounter: Payer: Self-pay | Admitting: *Deleted

## 2021-04-07 VITALS — BP 132/80 | HR 87 | Ht 72.0 in | Wt 343.2 lb

## 2021-04-07 DIAGNOSIS — Z01818 Encounter for other preprocedural examination: Secondary | ICD-10-CM | POA: Diagnosis not present

## 2021-04-07 DIAGNOSIS — Z01812 Encounter for preprocedural laboratory examination: Secondary | ICD-10-CM

## 2021-04-07 DIAGNOSIS — I5022 Chronic systolic (congestive) heart failure: Secondary | ICD-10-CM | POA: Diagnosis not present

## 2021-04-07 DIAGNOSIS — I4819 Other persistent atrial fibrillation: Secondary | ICD-10-CM | POA: Diagnosis not present

## 2021-04-07 DIAGNOSIS — I10 Essential (primary) hypertension: Secondary | ICD-10-CM | POA: Diagnosis not present

## 2021-04-07 NOTE — Addendum Note (Signed)
Addended by: Laurine Blazer on: 04/07/2021 01:26 PM   Modules accepted: Orders

## 2021-04-07 NOTE — Patient Instructions (Signed)
Medication Instructions:  Continue all current medications.  Labwork: CMET, TSH, CBC - orders given today   Testing/Procedures: Your physician has recommended that you have a Cardioversion (DCCV). Electrical Cardioversion uses a jolt of electricity to your heart either through paddles or wired patches attached to your chest. This is a controlled, usually prescheduled, procedure. Defibrillation is done under light anesthesia in the hospital, and you usually go home the day of the procedure. This is done to get your heart back into a normal rhythm. You are not awake for the procedure. Please see the instruction sheet given to you today.   Follow-Up: 2 months   Any Other Special Instructions Will Be Listed Below (If Applicable).   If you need a refill on your cardiac medications before your next appointment, please call your pharmacy.

## 2021-04-07 NOTE — Progress Notes (Signed)
Clinical Summary [name] is a 56 y.o.male seen today for follow up of the following medical problems.   1.Chronic systolic HF - prior severe systolic dysfunction as low as 15%. Most recent echos have shown normalized LVEF.Notes describe a NICM Jan 2021 echo: LVEF 55-60% -AICD followed by Dr Rayann Heman   - no recent edema. No SOB/DOE.          2. Afib - no recent palpitatins - compliant with meds - no bleeding on eliquis    - recent issues with peristent symptomatic afib 11/2020 DCCV unsuccesful - seen in afib clinc, see 04/05/21 note for antiarrhythmic discussion. Has been started on amiodarone, needs f/u cardioversion.  - has not missed any doses of eliquis.  - no recent palpitations.        3. HTN - compliant with meds       4. OSA -not compliant with cpap, reports not able to wear comfortable Past Medical History:  Diagnosis Date   AICD (automatic cardioverter/defibrillator) present    a. 10/2013 s/p MDT [id number] BiV ICD (ser# [id number] H).   COPD (chronic obstructive pulmonary disease) ([place])    a. Quit smoking in 2015.   HFrEF (heart failure with reduced ejection fraction) ([place])    a. 02/2012 Echo: EF "depressed";  b. 03/2013 Echo: Ef 15%, mod to sev LV dil w/ anteroseptal AK and sev glob HK. Dilated RV w/ reduced fxn. Mild to mod RAE, Sev LAE. Mild TR; c. 11/2013 Echo: EF 10-15%, redcued RV fxn, mild BAE, mild TR. ECHO 11/2020 EF 45-50%   History of ileus    Hypertension    Hypokalemia    Hypothyroidism    Major depression    NICM (nonischemic cardiomyopathy) ([place])    a. 02/2012 Echo: depressed EF; b. 02/2012 MV: EF 38%, small fixed apical defect, no ischemia; c. 03/2013 Echo: EF 15%; d. 08/2013 MV: apical ant, septal inf fixed defects, no ischemia; e. 10/2013 s/p MDT [id number] BiV ICD; d. 11/2013 Cath: results not avail, reportedly nl cors; e. 09/2014 MV: Fixed inf defect consistent w/ diaphragmatic atten. No ischemia. EF 28%.   Persistent atrial fibrillation (HCC)     a. CHA2DS2VASc = 2-->Eliquis.   Sleep apnea    a. Doesn't tolerate CPAP.     Allergies  Allergen Reactions   Aripiprazole Other (See Comments)    Right arm shaking    Tramadol Shortness Of Breath and Nausea Only    Pt states he has "flu type symptoms" after receiving tramadol    Ibuprofen Other (See Comments)    "Bleeding ulcers" per pt Spits up blood      Current Outpatient Medications  Medication Sig Dispense Refill   acetaminophen (TYLENOL) 500 MG tablet Take 1,000 mg by mouth every 6 (six) hours as needed for moderate pain or headache.     amiodarone (PACERONE) 200 MG tablet TAKE 1 TABLET (200 MG TOTAL) BY MOUTH DAILY. APPOINTMENT REQUIRED FOR FURTHER REFILLS [id number] 30 tablet 0   apixaban (ELIQUIS) 5 MG TABS tablet Take 5 mg by mouth 2 (two) times daily.     carvedilol (COREG) 12.5 MG tablet Take 1 tablet (12.5 mg total) by mouth 2 (two) times daily with a meal. 60 tablet 0   famotidine (PEPCID) 40 MG tablet Take 40 mg by mouth daily.     furosemide (LASIX) 20 MG tablet Take 1 tablet (20 mg total) by mouth daily.     gabapentin (NEURONTIN) 300 MG capsule Take 300 mg by mouth  3 (three) times daily.      levothyroxine (SYNTHROID) 112 MCG tablet Take 112 mcg by mouth daily before breakfast.     PROAIR HFA 108 (90 Base) MCG/ACT inhaler Inhale 2 puffs into the lungs every 4 (four) hours as needed for wheezing or shortness of breath.   3   sertraline (ZOLOFT) 50 MG tablet Take 50 mg by mouth daily.     SPIRIVA HANDIHALER 18 MCG inhalation capsule Place 18 mcg into inhaler and inhale daily.     SYMBICORT 160-4.5 MCG/ACT inhaler Inhale 2 puffs into the lungs in the morning and at bedtime.     No current facility-administered medications for this visit.     Past Surgical History:  Procedure Laterality Date   BIV ICD GENERATOR CHANGEOUT N/A 07/21/2019   Procedure: BIV ICD GENERATOR CHANGEOUT;  Surgeon: Deboraha Sprang, MD;  Location: [facility];  Service:  Cardiovascular;  Laterality: N/A;   CARDIAC DEFIBRILLATOR PLACEMENT     CARDIOVERSION N/A 12/14/2020   Procedure: CARDIOVERSION;  Surgeon: Fay Records, MD;  Location: [facility] ENDOSCOPY;  Service: Cardiovascular;  Laterality: N/A;   CATARACT EXTRACTION W/PHACO Left 09/20/2020   Procedure: CATARACT EXTRACTION PHACO AND INTRAOCULAR LENS PLACEMENT ([place]);  Surgeon: Baruch Goldmann, MD;  Location: [facility];  Service: Ophthalmology;  Laterality: Left;  CDE   6.21   PACEMAKER IMPLANT     SHOULDER DEBRIDEMENT Left    TEE WITHOUT CARDIOVERSION N/A 12/14/2020   Procedure: TRANSESOPHAGEAL ECHOCARDIOGRAM (TEE);  Surgeon: Fay Records, MD;  Location: [facility];  Service: Cardiovascular;  Laterality: N/A;     Allergies  Allergen Reactions   Aripiprazole Other (See Comments)    Right arm shaking    Tramadol Shortness Of Breath and Nausea Only    Pt states he has "flu type symptoms" after receiving tramadol    Ibuprofen Other (See Comments)    "Bleeding ulcers" per pt Spits up blood       Family History  Problem Relation Age of Onset   COPD Mother        decsd 2008   Hypertension Mother    Atrial fibrillation Mother    Heart failure Mother    Cancer Father        unknown cancer, died when patient was 51   Lung cancer Brother    Atrial fibrillation Brother      Social History Mr. Scarpelli reports that he quit smoking about 6 years ago. His smoking use included cigarettes. He has a 60.00 pack-year smoking history. He has never used smokeless tobacco. Mr. Bunyard reports no history of alcohol use.   Review of Systems CONSTITUTIONAL: No weight loss, fever, chills, weakness or fatigue.  HEENT: Eyes: No visual loss, blurred vision, double vision or yellow sclerae.No hearing loss, sneezing, congestion, runny nose or sore throat.  SKIN: No rash or itching.  CARDIOVASCULAR: per hpi RESPIRATORY: No shortness of breath, cough or sputum.  GASTROINTESTINAL: No anorexia, nausea, vomiting or  diarrhea. No abdominal pain or blood.  GENITOURINARY: No burning on urination, no polyuria NEUROLOGICAL: No headache, dizziness, syncope, paralysis, ataxia, numbness or tingling in the extremities. No change in bowel or bladder control.  MUSCULOSKELETAL: No muscle, back pain, joint pain or stiffness.  LYMPHATICS: No enlarged nodes. No history of splenectomy.  PSYCHIATRIC: No history of depression or anxiety.  ENDOCRINOLOGIC: No reports of sweating, cold or heat intolerance. No polyuria or polydipsia.  Marland Kitchen   Physical Examination Today's Vitals   04/07/21 1237  BP:  132/80  Pulse: 87  SpO2: 95%  Weight: (!) 343 lb 3.2 oz (155.7 kg)  Height: 6' (1.829 m)   Body mass index is 46.55 kg/m.  Gen: resting comfortably, no acute distress HEENT: no scleral icterus, pupils equal round and reactive, no palptable cervical adenopathy,  CV: irreg, no m/r/g no jvd Resp: Clear to auscultation bilaterally GI: abdomen is soft, non-tender, non-distended, normal bowel sounds, no hepatosplenomegaly MSK: extremities are warm, no edema.  Skin: warm, no rash Neuro:  no focal deficits Psych: appropriate affect   Diagnostic Studies     Assessment and Plan   Chronic systolic HF -LVEF has normalized - doing well without symptoms, continue current meds   2. Persistent afib - followed in afib clinic, started on amiodarone there with plans for repeat cardioversion - will plan for DCCV, he reports not missing any eliquis doses within last 3 weeks - [place] too far to travel, will not be able to continue to follow in afib clinic   3. HTN -at goal,continue current meds      Arnoldo Lenis, M.D.

## 2021-04-11 ENCOUNTER — Encounter (HOSPITAL_COMMUNITY): Payer: Medicaid Other

## 2021-04-12 ENCOUNTER — Telehealth: Payer: Self-pay | Admitting: Cardiology

## 2021-04-12 ENCOUNTER — Other Ambulatory Visit: Payer: Self-pay | Admitting: Cardiology

## 2021-04-12 DIAGNOSIS — I4891 Unspecified atrial fibrillation: Secondary | ICD-10-CM

## 2021-04-12 NOTE — Telephone Encounter (Signed)
Patient notified and verbalized understanding.   Note sent to [facility] for orders & to [facility]) for insurance pre-cert.

## 2021-04-12 NOTE — Telephone Encounter (Signed)
DCCV - scheduled for Wednesday, 04/20/2021 at 9:30 am - patient to arrive at 8:00 am. at [facility] - Dr. Domenic Polite.   Pre-op day - scheduled for Friday, 04/15/2021.

## 2021-04-12 NOTE — Telephone Encounter (Signed)
PERCERT:  DCCV scheduled for Tuesday, 04/19/2021 at 9:15 am with Dr. Debara Pickett at [facility].

## 2021-04-12 NOTE — Telephone Encounter (Signed)
DCCV rescheduled to Tuesday, 04/19/2021 at 9:15 am with Dr. Debara Pickett at [facility] - changed per patent request due to transportation.  He will need to arrive to short stay at 7:45 am on day of procedure.  Pre-op day will stay the same for this Friday, 04/15/2021.

## 2021-04-12 NOTE — Telephone Encounter (Signed)
° °  Pt would like to get info about his cardioversion

## 2021-04-13 ENCOUNTER — Other Ambulatory Visit (HOSPITAL_COMMUNITY): Payer: Self-pay | Admitting: Nurse Practitioner

## 2021-04-14 NOTE — Patient Instructions (Signed)
Gary Kirk  04/14/2021     @PREFPERIOPPHARMACY @   Your procedure is scheduled on 04/19/2021.  Report to [facility] at 7:45 A.M.  Call this number if you have problems the morning of surgery:  [phone]   Remember:  Do not eat or drink after midnight.     Take these medicines the morning of surgery with A SIP OF WATER : Pacerone, Pepcid, Synthroid and Neurontin   Please do not miss any Eliquis doses prior to procedure    Do not wear jewelry, make-up or nail polish.  Do not wear lotions, powders, or perfumes, or deodorant.  Do not shave 48 hours prior to surgery.  Men may shave face and neck.  Do not bring valuables to the hospital.  [facility] is not responsible for any belongings or valuables.  Contacts, dentures or bridgework may not be worn into surgery.  Leave your suitcase in the car.  After surgery it may be brought to your room.  For patients admitted to the hospital, discharge time will be determined by your treatment team.  Patients discharged the day of surgery will not be allowed to drive home.   Name and phone number of your driver:   Family Special instructions:  N/A  Please read over the following fact sheets that you were given. Care and Recovery After Surgery  Electrical Cardioversion Electrical cardioversion is the delivery of a jolt of electricity to restore a normal rhythm to the heart. A rhythm that is too fast or is not regular keeps the heart from pumping well. In this procedure, sticky patches or metal paddles are placed on the chest to deliver electricity to the heart from a device. This procedure may be done in an emergency if: There is low or no blood pressure as a result of the heart rhythm. Normal rhythm must be restored as fast as possible to protect the brain and heart from further damage. It may save a life. This may also be a scheduled procedure for irregular or fast heart rhythms that are not immediately life-threatening. Tell a  health care provider about: Any allergies you have. All medicines you are taking, including vitamins, herbs, eye drops, creams, and over-the-counter medicines. Any problems you or family members have had with anesthetic medicines. Any blood disorders you have. Any surgeries you have had. Any medical conditions you have. Whether you are pregnant or may be pregnant. What are the risks? Generally, this is a safe procedure. However, problems may occur, including: Allergic reactions to medicines. A blood clot that breaks free and travels to other parts of your body. The possible return of an abnormal heart rhythm within hours or days after the procedure. Your heart stopping (cardiac arrest). This is rare. What happens before the procedure? Medicines Your health care provider may have you start taking: Blood-thinning medicines (anticoagulants) so your blood does not clot as easily. Medicines to help stabilize your heart rate and rhythm. Ask your health care provider about: Changing or stopping your regular medicines. This is especially important if you are taking diabetes medicines or blood thinners. Taking medicines such as aspirin and ibuprofen. These medicines can thin your blood. Do not take these medicines unless your health care provider tells you to take them. Taking over-the-counter medicines, vitamins, herbs, and supplements. General instructions Follow instructions from your health care provider about eating or drinking restrictions. Plan to have someone take you home from the hospital or clinic. If you will be going home right  after the procedure, plan to have someone with you for 24 hours. Ask your health care provider what steps will be taken to help prevent infection. These may include washing your skin with a germ-killing soap. What happens during the procedure?  An IV will be inserted into one of your veins. Sticky patches (electrodes) or metal paddles may be placed on your  chest. You will be given a medicine to help you relax (sedative). An electrical shock will be delivered. The procedure may vary among health care providers and hospitals. What can I expect after the procedure? Your blood pressure, heart rate, breathing rate, and blood oxygen level will be monitored until you leave the hospital or clinic. Your heart rhythm will be watched to make sure it does not change. You may have some redness on the skin where the shocks were given. Follow these instructions at home: Do not drive for 24 hours if you were given a sedative during your procedure. Take over-the-counter and prescription medicines only as told by your health care provider. Ask your health care provider how to check your pulse. Check it often. Rest for 48 hours after the procedure or as told by your health care provider. Avoid or limit your caffeine use as told by your health care provider. Keep all follow-up visits as told by your health care provider. This is important. Contact a health care provider if: You feel like your heart is beating too quickly or your pulse is not regular. You have a serious muscle cramp that does not go away. Get help right away if: You have discomfort in your chest. You are dizzy or you feel faint. You have trouble breathing or you are short of breath. Your speech is slurred. You have trouble moving an arm or leg on one side of your body. Your fingers or toes turn cold or blue. Summary Electrical cardioversion is the delivery of a jolt of electricity to restore a normal rhythm to the heart. This procedure may be done right away in an emergency or may be a scheduled procedure if the condition is not an emergency. Generally, this is a safe procedure. After the procedure, check your pulse often as told by your health care provider. This information is not intended to replace advice given to you by your health care provider. Make sure you discuss any questions you  have with your health care provider. Document Revised: 09/16/2018 Document Reviewed: 09/16/2018 Elsevier Patient Education  [place].

## 2021-04-15 ENCOUNTER — Encounter (HOSPITAL_COMMUNITY)
Admission: RE | Admit: 2021-04-15 | Discharge: 2021-04-15 | Disposition: A | Discharge status: Home / Self Care | Payer: Medicaid Other | Source: Ambulatory Visit | Attending: Cardiology | Admitting: Cardiology

## 2021-04-19 ENCOUNTER — Encounter (HOSPITAL_COMMUNITY): Admission: RE | Payer: Self-pay | Source: Home / Self Care

## 2021-04-19 ENCOUNTER — Ambulatory Visit (HOSPITAL_COMMUNITY): Admission: RE | Admit: 2021-04-19 | Payer: Medicaid Other | Source: Home / Self Care | Admitting: Internal Medicine

## 2021-04-19 SURGERY — CARDIOVERSION
Anesthesia: Monitor Anesthesia Care

## 2021-05-09 ENCOUNTER — Ambulatory Visit (INDEPENDENT_AMBULATORY_CARE_PROVIDER_SITE_OTHER): Payer: Medicaid Other

## 2021-05-09 DIAGNOSIS — Z95810 Presence of automatic (implantable) cardiac defibrillator: Secondary | ICD-10-CM | POA: Diagnosis not present

## 2021-05-09 DIAGNOSIS — I5022 Chronic systolic (congestive) heart failure: Secondary | ICD-10-CM

## 2021-05-11 NOTE — Progress Notes (Signed)
EPIC Encounter for ICM Monitoring ? ?Patient Name: Gary Kirk is a 56 y.o. male ?Date: 05/11/2021 ?Primary Care Physican: Arnoldo Lenis, MD ?Primary Cardiologist: Dayna Ramus, NP ?Electrophysiologist: Allred ?Bi-V Pacing: 99.6%   ?04/05/2021 Weight: 347 lbs ?  ?Time in AT/AF  Off ?  ?     Spoke with patient and heart failure questions reviewed.  Pt asymptomatic for fluid accumulation. ?  ?Optivol thoracic impedance normal. ?  ?Prescribed: Furosemide 20 mg take 1 tablet daily. ?  ?Labs: ?12/07/2020 Creatinine 1.07, BUN 7,   Potassium 4.1, Sodium 138, GFR 81 ?10/22/2020 Creatinine 1.44, BUN 22, Potassium 3.1, Sodium 137, GFR 55->60 ?07/16/2020 Creatinine 1.10, BUN 10, Potassium 3.7, Sodium 139, GFR 76-88  ?A complete set of results can be found in Results Review. ?  ?Recommendations:  No changes and encouraged to call if experiencing any fluid symptoms. ?  ?Follow-up plan: ICM clinic phone appointment on 06/13/2021.  91 day device clinic remote transmission 08/12/2021.   ?  ?EP/Cardiology Office Visits:   06/24/2021 with Dr. Harl Bowie.  07/08/2021 with Dr Rayann Heman. ?  ?Copy of ICM check sent to Dr. Rayann Heman.  ? ?3 month ICM trend: 05/09/2021. ? ? ? ?12-14 Month ICM trend:  ? ? ? ?Rosalene Billings, RN ?05/11/2021 ?2:44 PM ? ?

## 2021-05-13 ENCOUNTER — Ambulatory Visit (INDEPENDENT_AMBULATORY_CARE_PROVIDER_SITE_OTHER): Payer: Medicaid Other

## 2021-05-13 DIAGNOSIS — I5022 Chronic systolic (congestive) heart failure: Secondary | ICD-10-CM

## 2021-05-13 DIAGNOSIS — I428 Other cardiomyopathies: Secondary | ICD-10-CM

## 2021-05-13 LAB — CUP PACEART REMOTE DEVICE CHECK
Battery Remaining Longevity: 88 mo
Battery Voltage: 2.99 V
Brady Statistic RA Percent Paced: 0.01 %
Brady Statistic RV Percent Paced: 94.88 %
Date Time Interrogation Session: 20230317123559
HighPow Impedance: 89 Ohm
Implantable Lead Implant Date: 20150904
Implantable Lead Implant Date: 20150904
Implantable Lead Implant Date: 20150904
Implantable Lead Location: 753858
Implantable Lead Location: 753859
Implantable Lead Location: 753860
Implantable Lead Model: 4298
Implantable Lead Model: 5076
Implantable Lead Model: 6935
Implantable Pulse Generator Implant Date: 20210524
Lead Channel Impedance Value: 1083 Ohm
Lead Channel Impedance Value: 1178 Ohm
Lead Channel Impedance Value: 1197 Ohm
Lead Channel Impedance Value: 265.661
Lead Channel Impedance Value: 274.19 Ohm
Lead Channel Impedance Value: 309.309
Lead Channel Impedance Value: 322.729
Lead Channel Impedance Value: 335.403
Lead Channel Impedance Value: 513 Ohm
Lead Channel Impedance Value: 551 Ohm
Lead Channel Impedance Value: 551 Ohm
Lead Channel Impedance Value: 589 Ohm
Lead Channel Impedance Value: 665 Ohm
Lead Channel Impedance Value: 703 Ohm
Lead Channel Impedance Value: 722 Ohm
Lead Channel Impedance Value: 779 Ohm
Lead Channel Impedance Value: 893 Ohm
Lead Channel Impedance Value: 950 Ohm
Lead Channel Pacing Threshold Amplitude: 0.375 V
Lead Channel Pacing Threshold Amplitude: 1.125 V
Lead Channel Pacing Threshold Amplitude: 1.125 V
Lead Channel Pacing Threshold Pulse Width: 0.4 ms
Lead Channel Pacing Threshold Pulse Width: 0.4 ms
Lead Channel Pacing Threshold Pulse Width: 0.4 ms
Lead Channel Sensing Intrinsic Amplitude: 1.625 mV
Lead Channel Sensing Intrinsic Amplitude: 1.625 mV
Lead Channel Sensing Intrinsic Amplitude: 5 mV
Lead Channel Sensing Intrinsic Amplitude: 5 mV
Lead Channel Setting Pacing Amplitude: 1.5 V
Lead Channel Setting Pacing Amplitude: 1.75 V
Lead Channel Setting Pacing Amplitude: 2.25 V
Lead Channel Setting Pacing Pulse Width: 0.4 ms
Lead Channel Setting Pacing Pulse Width: 0.4 ms
Lead Channel Setting Sensing Sensitivity: 0.3 mV

## 2021-05-19 NOTE — Progress Notes (Signed)
Remote ICD transmission.   

## 2021-06-17 NOTE — Progress Notes (Signed)
No ICM remote transmission received for 06/13/2021 and next ICM transmission scheduled for 07/04/2021.   ?

## 2021-06-24 ENCOUNTER — Ambulatory Visit: Payer: Medicaid Other | Admitting: Cardiology

## 2021-07-01 ENCOUNTER — Encounter: Payer: 59 | Admitting: Internal Medicine

## 2021-07-04 ENCOUNTER — Ambulatory Visit (INDEPENDENT_AMBULATORY_CARE_PROVIDER_SITE_OTHER): Payer: Medicaid Other

## 2021-07-04 DIAGNOSIS — I5022 Chronic systolic (congestive) heart failure: Secondary | ICD-10-CM | POA: Diagnosis not present

## 2021-07-04 DIAGNOSIS — Z95810 Presence of automatic (implantable) cardiac defibrillator: Secondary | ICD-10-CM

## 2021-07-05 ENCOUNTER — Telehealth: Payer: Self-pay

## 2021-07-05 NOTE — Telephone Encounter (Signed)
Left patient a message stating his device is not compatible with Carelink app and the current monitor he has is the only option at this time.   ?

## 2021-07-05 NOTE — Telephone Encounter (Signed)
Spoke with patient and requested to send remote transmission for fluid level review.  He will send manual transmission. Discussed carelink monitor it staying disconnected.  He has limited electrical outlets and only plugs monitor in when he needs to send remote transmission.  Advised will not receive alerts since monitor stays disconnected.  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  Advised there is a a Carelink app that some patients use and will check on that for him.  Advised he has appointment with Dr Rayann Heman on 5/12 and he does not have transportation and needs to have it rescheduled.  Message sent to [facility] office to call patient and reschedule appointment with Dr Rayann Heman.    ?

## 2021-07-07 NOTE — Progress Notes (Signed)
EPIC Encounter for ICM Monitoring ? ?Patient Name: Gary Kirk is a 56 y.o. male ?Date: 07/07/2021 ?Primary Care Physican: Arnoldo Lenis, MD ?Primary Cardiologist: Dayna Ramus, NP ?Electrophysiologist: Allred ?Bi-V Pacing: 99.9%   ?07/07/2021 Weight: 345 lbs ?  ?Time in AT/AF  Off ?  ?      Spoke with patient and heart failure questions reviewed.  Pt asymptomatic for fluid accumulation.  Reports feeling well at this time and voices no complaints.  ?  ?Optivol thoracic impedance normal. ?  ?Prescribed: Furosemide 20 mg take 1 tablet daily. ?  ?Labs: ?12/07/2020 Creatinine 1.07, BUN 7,   Potassium 4.1, Sodium 138, GFR 81 ?10/22/2020 Creatinine 1.44, BUN 22, Potassium 3.1, Sodium 137, GFR 55->60 ?07/16/2020 Creatinine 1.10, BUN 10, Potassium 3.7, Sodium 139, GFR 76-88  ?A complete set of results can be found in Results Review. ?  ?Recommendations:  No changes and encouraged to call if experiencing any fluid symptoms. ?  ?Follow-up plan: ICM clinic phone appointment on 08/15/2021.  91 day device clinic remote transmission 08/12/2021.   ?  ?EP/Cardiology Office Visits:   08/04/2021 with Dr. Harl Bowie.  08/05/2021 with Dr Rayann Heman. ?  ?Copy of ICM check sent to Dr. Rayann Heman.  ? ?3 month ICM trend: 07/05/2021. ? ? ? ?12-14 Month ICM trend:  ? ? ? ?Rosalene Billings, RN ?07/07/2021 ?8:49 AM ? ?

## 2021-07-08 ENCOUNTER — Encounter: Payer: Medicaid Other | Admitting: Internal Medicine

## 2021-08-04 ENCOUNTER — Ambulatory Visit: Payer: Medicaid Other | Admitting: Cardiology

## 2021-08-05 ENCOUNTER — Encounter: Payer: Medicaid Other | Admitting: Internal Medicine

## 2021-08-05 DIAGNOSIS — I480 Paroxysmal atrial fibrillation: Secondary | ICD-10-CM

## 2021-08-05 DIAGNOSIS — G4733 Obstructive sleep apnea (adult) (pediatric): Secondary | ICD-10-CM

## 2021-08-05 DIAGNOSIS — I5022 Chronic systolic (congestive) heart failure: Secondary | ICD-10-CM

## 2021-08-05 DIAGNOSIS — I10 Essential (primary) hypertension: Secondary | ICD-10-CM

## 2021-08-05 DIAGNOSIS — I428 Other cardiomyopathies: Secondary | ICD-10-CM

## 2021-08-12 ENCOUNTER — Ambulatory Visit (INDEPENDENT_AMBULATORY_CARE_PROVIDER_SITE_OTHER): Payer: Medicaid Other

## 2021-08-12 DIAGNOSIS — I428 Other cardiomyopathies: Secondary | ICD-10-CM

## 2021-08-15 ENCOUNTER — Telehealth: Payer: Self-pay

## 2021-08-15 ENCOUNTER — Ambulatory Visit (INDEPENDENT_AMBULATORY_CARE_PROVIDER_SITE_OTHER): Payer: Medicaid Other

## 2021-08-15 DIAGNOSIS — I5022 Chronic systolic (congestive) heart failure: Secondary | ICD-10-CM

## 2021-08-15 DIAGNOSIS — Z95810 Presence of automatic (implantable) cardiac defibrillator: Secondary | ICD-10-CM

## 2021-08-15 NOTE — Telephone Encounter (Signed)
Spoke with patient and requested to send monthly remote transmission for fluid level review.  He will send one by the end of the day.

## 2021-08-15 NOTE — Progress Notes (Signed)
EPIC Encounter for ICM Monitoring  Patient Name: Gary Kirk is a 56 y.o. male Date: 08/15/2021 Primary Care Physican: Arnoldo Lenis, MD Primary Cardiologist: Dayna Ramus, NP Electrophysiologist: Audry Riles Pacing: 99.9%   07/07/2021 Weight: 345 lbs   Time in AT/AF  Off         Spoke with patient and heart failure questions reviewed.  Pt asymptomatic for fluid accumulation.  Reports feeling well at this time and voices no complaints.    Optivol thoracic impedance normal.   Prescribed: Furosemide 20 mg take 1 tablet daily.   Labs: 12/07/2020 Creatinine 1.07, BUN 7,   Potassium 4.1, Sodium 138, GFR 81 10/22/2020 Creatinine 1.44, BUN 22, Potassium 3.1, Sodium 137, GFR 55->60 07/16/2020 Creatinine 1.10, BUN 10, Potassium 3.7, Sodium 139, GFR 76-88  A complete set of results can be found in Results Review.   Recommendations:  No changes and encouraged to call if experiencing any fluid symptoms.   Follow-up plan: ICM clinic phone appointment on 09/26/2021.  91 day device clinic remote transmission 11/11/2021.     EP/Cardiology Office Visits:  Reminded to call office to reschedule appointments with Dr Harl Bowie and Dr Rayann Heman.  Canceled  08/04/2021 with Dr. Harl Bowie.  Canceled 08/05/2021 with Dr Rayann Heman.   Copy of ICM check sent to Dr. Rayann Heman.   3 month ICM trend: 08/15/2021.    12-14 Month ICM trend:     Rosalene Billings, RN 08/15/2021 4:43 PM

## 2021-08-16 LAB — CUP PACEART REMOTE DEVICE CHECK
Battery Remaining Longevity: 84 mo
Battery Voltage: 2.99 V
Brady Statistic RA Percent Paced: 0 %
Brady Statistic RV Percent Paced: 99.94 %
Date Time Interrogation Session: 20230619163400
HighPow Impedance: 89 Ohm
Implantable Lead Implant Date: 20150904
Implantable Lead Implant Date: 20150904
Implantable Lead Implant Date: 20150904
Implantable Lead Location: 753858
Implantable Lead Location: 753859
Implantable Lead Location: 753860
Implantable Lead Model: 4298
Implantable Lead Model: 5076
Implantable Lead Model: 6935
Implantable Pulse Generator Implant Date: 20210524
Lead Channel Impedance Value: 1007 Ohm
Lead Channel Impedance Value: 1083 Ohm
Lead Channel Impedance Value: 1197 Ohm
Lead Channel Impedance Value: 261.164
Lead Channel Impedance Value: 279.525
Lead Channel Impedance Value: 309.309
Lead Channel Impedance Value: 316.116
Lead Channel Impedance Value: 335.403
Lead Channel Impedance Value: 513 Ohm
Lead Channel Impedance Value: 532 Ohm
Lead Channel Impedance Value: 532 Ohm
Lead Channel Impedance Value: 589 Ohm
Lead Channel Impedance Value: 589 Ohm
Lead Channel Impedance Value: 608 Ohm
Lead Channel Impedance Value: 646 Ohm
Lead Channel Impedance Value: 779 Ohm
Lead Channel Impedance Value: 874 Ohm
Lead Channel Impedance Value: 893 Ohm
Lead Channel Pacing Threshold Amplitude: 0.375 V
Lead Channel Pacing Threshold Amplitude: 1.125 V
Lead Channel Pacing Threshold Amplitude: 1.25 V
Lead Channel Pacing Threshold Pulse Width: 0.4 ms
Lead Channel Pacing Threshold Pulse Width: 0.4 ms
Lead Channel Pacing Threshold Pulse Width: 0.4 ms
Lead Channel Sensing Intrinsic Amplitude: 1.375 mV
Lead Channel Sensing Intrinsic Amplitude: 1.375 mV
Lead Channel Sensing Intrinsic Amplitude: 4.25 mV
Lead Channel Sensing Intrinsic Amplitude: 4.25 mV
Lead Channel Setting Pacing Amplitude: 1.5 V
Lead Channel Setting Pacing Amplitude: 1.75 V
Lead Channel Setting Pacing Amplitude: 2.25 V
Lead Channel Setting Pacing Pulse Width: 0.4 ms
Lead Channel Setting Pacing Pulse Width: 0.4 ms
Lead Channel Setting Sensing Sensitivity: 0.3 mV

## 2021-09-26 ENCOUNTER — Ambulatory Visit (INDEPENDENT_AMBULATORY_CARE_PROVIDER_SITE_OTHER): Payer: Medicaid Other

## 2021-09-26 DIAGNOSIS — Z95810 Presence of automatic (implantable) cardiac defibrillator: Secondary | ICD-10-CM | POA: Diagnosis not present

## 2021-09-26 DIAGNOSIS — I5022 Chronic systolic (congestive) heart failure: Secondary | ICD-10-CM | POA: Diagnosis not present

## 2021-09-26 NOTE — Progress Notes (Signed)
EPIC Encounter for ICM Monitoring  Patient Name: Gary Kirk is a 56 y.o. male Date: 09/26/2021 Primary Care Physican: Arnoldo Lenis, MD Primary Cardiologist: Dayna Ramus, NP Electrophysiologist: Audry Riles Pacing: 99.9%   07/07/2021 Weight: 345 lbs   Time in AT/AF  Off         Spoke with patient and heart failure questions reviewed.  Pt asymptomatic for fluid accumulation.     Optivol thoracic impedance normal.   Prescribed: Furosemide 20 mg take 1 tablet daily.   Labs: 12/07/2020 Creatinine 1.07, BUN 7,   Potassium 4.1, Sodium 138, GFR 81 10/22/2020 Creatinine 1.44, BUN 22, Potassium 3.1, Sodium 137, GFR 55->60 07/16/2020 Creatinine 1.10, BUN 10, Potassium 3.7, Sodium 139, GFR 76-88  A complete set of results can be found in Results Review.   Recommendations:  Advised to take 1 extra Torsemide 20 mg x 2 days and then return to 1 tablet daily   Follow-up plan: ICM clinic phone appointment on 09/29/2021 to recheck fluid levels.  91 day device clinic remote transmission 11/11/2021.     EP/Cardiology Office Visits:  Reminded to call office to reschedule appointments with Dr Harl Bowie and Dr Rayann Heman.  Canceled  08/04/2021 with Dr. Harl Bowie.  Canceled 08/05/2021 with Dr Rayann Heman.   Copy of ICM check sent to Dr. Rayann Heman and Dr Harl Bowie for review.   3 month ICM trend: 09/26/2021.    12-14 Month ICM trend:     Rosalene Billings, RN 09/26/2021 12:54 PM

## 2021-09-29 ENCOUNTER — Ambulatory Visit (INDEPENDENT_AMBULATORY_CARE_PROVIDER_SITE_OTHER): Payer: Medicaid Other

## 2021-09-29 DIAGNOSIS — I5022 Chronic systolic (congestive) heart failure: Secondary | ICD-10-CM

## 2021-09-29 DIAGNOSIS — Z95810 Presence of automatic (implantable) cardiac defibrillator: Secondary | ICD-10-CM

## 2021-09-29 NOTE — Progress Notes (Signed)
EPIC Encounter for ICM Monitoring  Patient Name: Gary Kirk is a 56 y.o. male Date: 09/29/2021 Primary Care Physican: Arnoldo Lenis, MD Primary Cardiologist: Dayna Ramus, NP Electrophysiologist: Audry Riles Pacing: 99.9%   07/07/2021 Weight: 345 lbs 09/29/2021 Weight: No scales at home   Time in AT/AF 24.0 hr/day (100.0%) Longest AT/AF 13 months         Spoke with patient and heart failure questions reviewed.  Pt asymptomatic for fluid accumulation.  Reports feeling well at this time and voices no complaints.     Optivol thoracic impedance suggesting possible fluid accumulation starting 7/25 and trending back toward baseline after A.   Prescribed: Furosemide 20 mg take 1 tablet daily.   Labs: 12/07/2020 Creatinine 1.07, BUN 7,   Potassium 4.1, Sodium 138, GFR 81 10/22/2020 Creatinine 1.44, BUN 22, Potassium 3.1, Sodium 137, GFR 55->60 07/16/2020 Creatinine 1.10, BUN 10, Potassium 3.7, Sodium 139, GFR 76-88  A complete set of results can be found in Results Review.   Recommendations:  Advised to take extra Torsemide x 1 day.  Encouraged to call if experiencing any fluid symptoms.   Follow-up plan: ICM clinic phone appointment on 11/01/2021.  91 day device clinic remote transmission 11/11/2021.     EP/Cardiology Office Visits:  Pt aware to call office to reschedule appointments with Dr Harl Bowie and Dr Rayann Heman.  Canceled  08/04/2021 with Dr. Harl Bowie.  Canceled 08/05/2021 with Dr Rayann Heman.   Copy of ICM check sent to Dr. Rayann Heman .   3 month ICM trend: 09/29/2021.    12-14 Month ICM trend:     Rosalene Billings, RN 09/29/2021 8:16 AM

## 2021-11-01 ENCOUNTER — Telehealth: Payer: Self-pay

## 2021-11-01 ENCOUNTER — Ambulatory Visit (INDEPENDENT_AMBULATORY_CARE_PROVIDER_SITE_OTHER): Payer: Medicaid Other

## 2021-11-01 DIAGNOSIS — Z95810 Presence of automatic (implantable) cardiac defibrillator: Secondary | ICD-10-CM | POA: Diagnosis not present

## 2021-11-01 DIAGNOSIS — I5022 Chronic systolic (congestive) heart failure: Secondary | ICD-10-CM

## 2021-11-01 NOTE — Telephone Encounter (Signed)
Spoke with patient and requested to send ICM remote transmission to check monthly fluid levels.  He will send transmission today.

## 2021-11-01 NOTE — Progress Notes (Signed)
EPIC Encounter for ICM Monitoring  Patient Name: Gary Kirk is a 56 y.o. male Date: 11/01/2021 Primary Care Physican: Arnoldo Lenis, MD Electrophysiologist: Mealor Bi-V Pacing: 99.9%   07/07/2021 Weight: 345 lbs 09/29/2021 Weight: No scales at home   Time in AT/AF  24.0 hr/day (100.0%) Longest AT/AF 14 months         Spoke with patient and heart failure questions reviewed.  Pt asymptomatic for fluid accumulation. He stopped taking Furosemide and discussed the importance of taking as prescribed which is to help the heart and kidneys get rid of excessive fluid.   He does not have side effects or problems taking the Furosemide.   Optivol thoracic impedance suggesting possible fluid accumulation starting 8/8.   Prescribed: Furosemide 20 mg take 1 tablet daily.   Labs: 12/07/2020 Creatinine 1.07, BUN 7,   Potassium 4.1, Sodium 138, GFR 81 10/22/2020 Creatinine 1.44, BUN 22, Potassium 3.1, Sodium 137, GFR 55->60 07/16/2020 Creatinine 1.10, BUN 10, Potassium 3.7, Sodium 139, GFR 76-88  A complete set of results can be found in Results Review.   Recommendations:  Advised to take Furosemide as prescribed and not to stop prescription or miss dosages.   Follow-up plan: ICM clinic phone appointment on 11/07/2021.  91 day device clinic remote transmission 11/11/2021.     EP/Cardiology Office Visits:  Advised to call office [place] office to reschedule appointments with Dr Harl Bowie and with new EP physician Dr Myles Gip.  Canceled  08/04/2021 with Dr. Harl Bowie.  Canceled 08/05/2021 with Dr Rayann Heman.   Copy of ICM check sent to Dr. Myles Gip (replacing Dr Rayann Heman) and Dr Harl Bowie for review and FYI.   3 month ICM trend: 11/01/2021.    12-14 Month ICM trend:     Rosalene Billings, RN 11/01/2021 3:41 PM

## 2021-11-09 NOTE — Progress Notes (Signed)
No ICM remote transmission received for 11/07/2021 and next ICM transmission scheduled for 12/12/2021.

## 2021-12-05 ENCOUNTER — Ambulatory Visit (INDEPENDENT_AMBULATORY_CARE_PROVIDER_SITE_OTHER): Payer: Medicaid Other

## 2021-12-05 DIAGNOSIS — I428 Other cardiomyopathies: Secondary | ICD-10-CM

## 2021-12-06 LAB — CUP PACEART REMOTE DEVICE CHECK
Battery Remaining Longevity: 79 mo
Battery Voltage: 2.98 V
Brady Statistic RA Percent Paced: 0.01 %
Brady Statistic RV Percent Paced: 99.77 %
Date Time Interrogation Session: 20231009144934
HighPow Impedance: 78 Ohm
Implantable Lead Implant Date: 20150904
Implantable Lead Implant Date: 20150904
Implantable Lead Implant Date: 20150904
Implantable Lead Location: 753858
Implantable Lead Location: 753859
Implantable Lead Location: 753860
Implantable Lead Model: 4298
Implantable Lead Model: 5076
Implantable Lead Model: 6935
Implantable Pulse Generator Implant Date: 20210524
Lead Channel Impedance Value: 1026 Ohm
Lead Channel Impedance Value: 1140 Ohm
Lead Channel Impedance Value: 250.943
Lead Channel Impedance Value: 261.164
Lead Channel Impedance Value: 286.508
Lead Channel Impedance Value: 299.908
Lead Channel Impedance Value: 306.303
Lead Channel Impedance Value: 475 Ohm
Lead Channel Impedance Value: 513 Ohm
Lead Channel Impedance Value: 532 Ohm
Lead Channel Impedance Value: 551 Ohm
Lead Channel Impedance Value: 551 Ohm
Lead Channel Impedance Value: 646 Ohm
Lead Channel Impedance Value: 665 Ohm
Lead Channel Impedance Value: 722 Ohm
Lead Channel Impedance Value: 760 Ohm
Lead Channel Impedance Value: 817 Ohm
Lead Channel Impedance Value: 950 Ohm
Lead Channel Pacing Threshold Amplitude: 0.375 V
Lead Channel Pacing Threshold Amplitude: 1.125 V
Lead Channel Pacing Threshold Amplitude: 1.125 V
Lead Channel Pacing Threshold Pulse Width: 0.4 ms
Lead Channel Pacing Threshold Pulse Width: 0.4 ms
Lead Channel Pacing Threshold Pulse Width: 0.4 ms
Lead Channel Sensing Intrinsic Amplitude: 1.75 mV
Lead Channel Sensing Intrinsic Amplitude: 1.75 mV
Lead Channel Sensing Intrinsic Amplitude: 4.25 mV
Lead Channel Sensing Intrinsic Amplitude: 4.25 mV
Lead Channel Setting Pacing Amplitude: 1.5 V
Lead Channel Setting Pacing Amplitude: 1.75 V
Lead Channel Setting Pacing Amplitude: 2.25 V
Lead Channel Setting Pacing Pulse Width: 0.4 ms
Lead Channel Setting Pacing Pulse Width: 0.4 ms
Lead Channel Setting Sensing Sensitivity: 0.3 mV

## 2021-12-20 NOTE — Progress Notes (Signed)
No ICM remote transmission received for 12/12/2021 and next ICM transmission scheduled for 01/09/2022.   

## 2022-01-06 NOTE — Progress Notes (Signed)
Remote ICD transmission.   

## 2022-01-11 ENCOUNTER — Telehealth: Payer: Self-pay

## 2022-01-11 NOTE — Telephone Encounter (Signed)
Spoke with patient and requested to send update ICM remote transmission to review fluid levels.  He is not home and will send it tomorrow or Friday morning.  ICM remote transmission rescheduled for 11/17.

## 2022-01-18 NOTE — Telephone Encounter (Signed)
No ICM remote transmission received for 01/13/2022 and next ICM transmission scheduled for 02/06/2022.

## 2022-02-06 ENCOUNTER — Ambulatory Visit (INDEPENDENT_AMBULATORY_CARE_PROVIDER_SITE_OTHER): Payer: Medicaid Other

## 2022-02-06 DIAGNOSIS — Z95810 Presence of automatic (implantable) cardiac defibrillator: Secondary | ICD-10-CM | POA: Diagnosis not present

## 2022-02-06 DIAGNOSIS — I5022 Chronic systolic (congestive) heart failure: Secondary | ICD-10-CM

## 2022-02-07 NOTE — Progress Notes (Signed)
EPIC Encounter for ICM Monitoring  Patient Name: Gary Kirk is a 56 y.o. male Date: 02/07/2022 Primary Care Physican: Arnoldo Lenis, MD Electrophysiologist: Mealor Bi-V Pacing: 99.9%   07/07/2021 Weight: 345 lbs 09/29/2021 Weight: No scales at home   Time in AT/AF  24.0 hr/day (100.0%) Longest AT/AF 17 months         Spoke with patient and heart failure questions reviewed.  Pt asymptomatic for fluid accumulation.  Reports he is feeling well and has new puppy at home.    Optivol thoracic impedance suggesting possible fluid accumulation starting 12/4.   Prescribed: Furosemide 20 mg take 1 tablet daily.   Labs: 12/07/2020 Creatinine 1.07, BUN 7,   Potassium 4.1, Sodium 138, GFR 81 10/22/2020 Creatinine 1.44, BUN 22, Potassium 3.1, Sodium 137, GFR 55->60 07/16/2020 Creatinine 1.10, BUN 10, Potassium 3.7, Sodium 139, GFR 76-88  A complete set of results can be found in Results Review.   Recommendations:  Advised to take 1 extra Furosemide tablet x 2 days and then return to prescribed dose of 1 tablet daily.    Follow-up plan: ICM clinic phone appointment on 02/13/2022 (manual) to recheck fluid levels.  91 day device clinic remote transmission 03/06/2022.     EP/Cardiology Office Visits:  Advised to call office [place] office to reschedule appointments with Dr Harl Bowie and with new EP physician Dr Myles Gip.  Canceled  08/04/2021 with Dr. Harl Bowie.  Canceled 08/05/2021 with Dr Rayann Heman.   Copy of ICM check sent to Dr. Myles Gip and Dr Harl Bowie as Juluis Rainier.   3 month ICM trend: 02/06/2022.    12-14 Month ICM trend:     Rosalene Billings, RN 02/07/2022 12:28 PM

## 2022-02-16 NOTE — Progress Notes (Signed)
No ICM remote transmission received for 02/13/2022 and next ICM transmission scheduled for 03/20/2022.   

## 2022-03-06 ENCOUNTER — Ambulatory Visit (INDEPENDENT_AMBULATORY_CARE_PROVIDER_SITE_OTHER): Payer: Medicaid Other

## 2022-03-06 DIAGNOSIS — I428 Other cardiomyopathies: Secondary | ICD-10-CM

## 2022-03-06 DIAGNOSIS — I5022 Chronic systolic (congestive) heart failure: Secondary | ICD-10-CM

## 2022-03-08 LAB — CUP PACEART REMOTE DEVICE CHECK
Battery Remaining Longevity: 76 mo
Battery Voltage: 2.98 V
Brady Statistic RA Percent Paced: 0 %
Brady Statistic RV Percent Paced: 99.89 %
Date Time Interrogation Session: 20240109144513
HighPow Impedance: 80 Ohm
Implantable Lead Connection Status: 753985
Implantable Lead Connection Status: 753985
Implantable Lead Connection Status: 753985
Implantable Lead Implant Date: 20150904
Implantable Lead Implant Date: 20150904
Implantable Lead Implant Date: 20150904
Implantable Lead Location: 753858
Implantable Lead Location: 753859
Implantable Lead Location: 753860
Implantable Lead Model: 4298
Implantable Lead Model: 5076
Implantable Lead Model: 6935
Implantable Pulse Generator Implant Date: 20210524
Lead Channel Impedance Value: 1083 Ohm
Lead Channel Impedance Value: 1178 Ohm
Lead Channel Impedance Value: 234.08 Ohm
Lead Channel Impedance Value: 250.943
Lead Channel Impedance Value: 264.733
Lead Channel Impedance Value: 286.508
Lead Channel Impedance Value: 306.303
Lead Channel Impedance Value: 418 Ohm
Lead Channel Impedance Value: 475 Ohm
Lead Channel Impedance Value: 532 Ohm
Lead Channel Impedance Value: 532 Ohm
Lead Channel Impedance Value: 589 Ohm
Lead Channel Impedance Value: 608 Ohm
Lead Channel Impedance Value: 703 Ohm
Lead Channel Impedance Value: 722 Ohm
Lead Channel Impedance Value: 817 Ohm
Lead Channel Impedance Value: 874 Ohm
Lead Channel Impedance Value: 988 Ohm
Lead Channel Pacing Threshold Amplitude: 0.375 V
Lead Channel Pacing Threshold Amplitude: 1 V
Lead Channel Pacing Threshold Amplitude: 1.125 V
Lead Channel Pacing Threshold Pulse Width: 0.4 ms
Lead Channel Pacing Threshold Pulse Width: 0.4 ms
Lead Channel Pacing Threshold Pulse Width: 0.4 ms
Lead Channel Sensing Intrinsic Amplitude: 1 mV
Lead Channel Sensing Intrinsic Amplitude: 1 mV
Lead Channel Sensing Intrinsic Amplitude: 4.25 mV
Lead Channel Sensing Intrinsic Amplitude: 4.25 mV
Lead Channel Setting Pacing Amplitude: 1.5 V
Lead Channel Setting Pacing Amplitude: 1.5 V
Lead Channel Setting Pacing Amplitude: 2.25 V
Lead Channel Setting Pacing Pulse Width: 0.4 ms
Lead Channel Setting Pacing Pulse Width: 0.4 ms
Lead Channel Setting Sensing Sensitivity: 0.3 mV
Zone Setting Status: 755011
Zone Setting Status: 755011

## 2022-03-09 ENCOUNTER — Ambulatory Visit (INDEPENDENT_AMBULATORY_CARE_PROVIDER_SITE_OTHER): Payer: Medicaid Other

## 2022-03-09 DIAGNOSIS — Z95810 Presence of automatic (implantable) cardiac defibrillator: Secondary | ICD-10-CM

## 2022-03-09 DIAGNOSIS — I5022 Chronic systolic (congestive) heart failure: Secondary | ICD-10-CM

## 2022-03-13 NOTE — Progress Notes (Signed)
EPIC Encounter for ICM Monitoring  Patient Name: Gary Kirk is a 57 y.o. male Date: 03/13/2022 Primary Care Physican: Arnoldo Lenis, MD Electrophysiologist: Mealor Bi-V Pacing: 99.9%   07/07/2021 Weight: 345 lbs 09/29/2021 Weight: No scales at home   Time in AT/AF  24.0 hr/day (100.0%) Longest AT/AF 18 months         Transmission results reviewed.   Optivol thoracic impedance suggesting normal fluid levels.   Prescribed: Furosemide 20 mg take 1 tablet daily.   Labs: 12/07/2020 Creatinine 1.07, BUN 7,   Potassium 4.1, Sodium 138, GFR 81 10/22/2020 Creatinine 1.44, BUN 22, Potassium 3.1, Sodium 137, GFR 55->60 07/16/2020 Creatinine 1.10, BUN 10, Potassium 3.7, Sodium 139, GFR 76-88  A complete set of results can be found in Results Review.   Recommendations:  No changes.   Follow-up plan: ICM clinic phone appointment on 04/10/2022.  91 day device clinic remote transmission 06/05/2022.     EP/Cardiology Office Visits:  Aware to call office [place] office to reschedule appointments with Dr Harl Bowie and Dr Myles Gip.  Canceled  08/04/2021 with Dr. Harl Bowie.  Canceled 08/05/2021 with Dr Rayann Heman.   Copy of ICM check sent to Dr. Myles Gip   3 month ICM trend: 03/08/2022.    12-14 Month ICM trend:     Rosalene Billings, RN 03/13/2022 2:24 PM

## 2022-04-06 ENCOUNTER — Encounter (HOSPITAL_COMMUNITY): Payer: Self-pay | Admitting: *Deleted

## 2022-04-10 ENCOUNTER — Ambulatory Visit: Payer: Medicaid Other

## 2022-04-17 NOTE — Progress Notes (Signed)
Remote ICD transmission.   

## 2022-05-05 ENCOUNTER — Telehealth: Payer: Self-pay

## 2022-05-05 NOTE — Telephone Encounter (Signed)
I called patient about missed ICM transmission but no answer/ no voicemail. ?

## 2022-05-08 NOTE — Progress Notes (Signed)
No ICM remote transmission received for 05/01/2022 and next ICM transmission scheduled for 05/22/2022.   

## 2022-05-26 NOTE — Progress Notes (Signed)
No ICM remote transmission received for 05/22/2022 and next ICM transmission scheduled for 06/06/2022.

## 2022-06-08 ENCOUNTER — Telehealth: Payer: Self-pay

## 2022-06-08 NOTE — Telephone Encounter (Signed)
Attempted ICM Call to request remote transmission and call did not go through.  Unable to reach patient for monthly ICM remote follow up and not receiving monthly remote transmission.  Patient disenrolled due patient is not actively participating in [facility] clinic.  Device clinic 91 day remote monitoring will continue per protocol.

## 2022-07-19 ENCOUNTER — Ambulatory Visit (INDEPENDENT_AMBULATORY_CARE_PROVIDER_SITE_OTHER): Payer: Medicaid Other

## 2022-07-19 DIAGNOSIS — I428 Other cardiomyopathies: Secondary | ICD-10-CM | POA: Diagnosis not present

## 2022-07-19 DIAGNOSIS — I5022 Chronic systolic (congestive) heart failure: Secondary | ICD-10-CM

## 2022-07-19 LAB — CUP PACEART REMOTE DEVICE CHECK
Battery Remaining Longevity: 70 mo
Battery Voltage: 2.98 V
Brady Statistic RA Percent Paced: 0 %
Brady Statistic RV Percent Paced: 99.92 %
Date Time Interrogation Session: 20240522115240
HighPow Impedance: 82 Ohm
Implantable Lead Connection Status: 753985
Implantable Lead Connection Status: 753985
Implantable Lead Connection Status: 753985
Implantable Lead Implant Date: 20150904
Implantable Lead Implant Date: 20150904
Implantable Lead Implant Date: 20150904
Implantable Lead Location: 753858
Implantable Lead Location: 753859
Implantable Lead Location: 753860
Implantable Lead Model: 4298
Implantable Lead Model: 5076
Implantable Lead Model: 6935
Implantable Pulse Generator Implant Date: 20210524
Lead Channel Impedance Value: 1064 Ohm
Lead Channel Impedance Value: 1178 Ohm
Lead Channel Impedance Value: 261.164
Lead Channel Impedance Value: 261.164
Lead Channel Impedance Value: 309.309
Lead Channel Impedance Value: 309.309
Lead Channel Impedance Value: 316.116
Lead Channel Impedance Value: 513 Ohm
Lead Channel Impedance Value: 513 Ohm
Lead Channel Impedance Value: 532 Ohm
Lead Channel Impedance Value: 532 Ohm
Lead Channel Impedance Value: 589 Ohm
Lead Channel Impedance Value: 646 Ohm
Lead Channel Impedance Value: 646 Ohm
Lead Channel Impedance Value: 779 Ohm
Lead Channel Impedance Value: 893 Ohm
Lead Channel Impedance Value: 931 Ohm
Lead Channel Impedance Value: 988 Ohm
Lead Channel Pacing Threshold Amplitude: 0.25 V
Lead Channel Pacing Threshold Amplitude: 1.125 V
Lead Channel Pacing Threshold Amplitude: 1.25 V
Lead Channel Pacing Threshold Pulse Width: 0.4 ms
Lead Channel Pacing Threshold Pulse Width: 0.4 ms
Lead Channel Pacing Threshold Pulse Width: 0.4 ms
Lead Channel Sensing Intrinsic Amplitude: 1.125 mV
Lead Channel Sensing Intrinsic Amplitude: 1.125 mV
Lead Channel Sensing Intrinsic Amplitude: 5 mV
Lead Channel Sensing Intrinsic Amplitude: 5 mV
Lead Channel Setting Pacing Amplitude: 1.5 V
Lead Channel Setting Pacing Amplitude: 1.75 V
Lead Channel Setting Pacing Amplitude: 2.25 V
Lead Channel Setting Pacing Pulse Width: 0.4 ms
Lead Channel Setting Pacing Pulse Width: 0.4 ms
Lead Channel Setting Sensing Sensitivity: 0.3 mV
Zone Setting Status: 755011
Zone Setting Status: 755011

## 2022-08-10 NOTE — Progress Notes (Signed)
Remote ICD transmission.   

## 2022-09-26 ENCOUNTER — Encounter (INDEPENDENT_AMBULATORY_CARE_PROVIDER_SITE_OTHER): Payer: Self-pay | Admitting: *Deleted

## 2022-10-18 ENCOUNTER — Ambulatory Visit (INDEPENDENT_AMBULATORY_CARE_PROVIDER_SITE_OTHER): Payer: Medicaid Other

## 2022-10-18 DIAGNOSIS — I428 Other cardiomyopathies: Secondary | ICD-10-CM | POA: Diagnosis not present

## 2022-10-20 ENCOUNTER — Telehealth (INDEPENDENT_AMBULATORY_CARE_PROVIDER_SITE_OTHER): Payer: Self-pay | Admitting: *Deleted

## 2022-10-20 LAB — CUP PACEART REMOTE DEVICE CHECK
Battery Remaining Longevity: 64 mo
Battery Voltage: 2.97 V
Brady Statistic RA Percent Paced: 0.01 %
Brady Statistic RV Percent Paced: 99.76 %
Date Time Interrogation Session: 20240822181756
HighPow Impedance: 91 Ohm
Implantable Lead Connection Status: 753985
Implantable Lead Connection Status: 753985
Implantable Lead Connection Status: 753985
Implantable Lead Implant Date: 20150904
Implantable Lead Implant Date: 20150904
Implantable Lead Implant Date: 20150904
Implantable Lead Location: 753858
Implantable Lead Location: 753859
Implantable Lead Location: 753860
Implantable Lead Model: 4298
Implantable Lead Model: 5076
Implantable Lead Model: 6935
Implantable Pulse Generator Implant Date: 20210524
Lead Channel Impedance Value: 1007 Ohm
Lead Channel Impedance Value: 1064 Ohm
Lead Channel Impedance Value: 1235 Ohm
Lead Channel Impedance Value: 234.08 Ohm
Lead Channel Impedance Value: 261.164
Lead Channel Impedance Value: 276.523
Lead Channel Impedance Value: 315.129
Lead Channel Impedance Value: 322.197
Lead Channel Impedance Value: 418 Ohm
Lead Channel Impedance Value: 513 Ohm
Lead Channel Impedance Value: 532 Ohm
Lead Channel Impedance Value: 532 Ohm
Lead Channel Impedance Value: 608 Ohm
Lead Channel Impedance Value: 703 Ohm
Lead Channel Impedance Value: 722 Ohm
Lead Channel Impedance Value: 779 Ohm
Lead Channel Impedance Value: 817 Ohm
Lead Channel Impedance Value: 817 Ohm
Lead Channel Pacing Threshold Amplitude: 0.375 V
Lead Channel Pacing Threshold Amplitude: 1.125 V
Lead Channel Pacing Threshold Amplitude: 1.375 V
Lead Channel Pacing Threshold Pulse Width: 0.4 ms
Lead Channel Pacing Threshold Pulse Width: 0.4 ms
Lead Channel Pacing Threshold Pulse Width: 0.4 ms
Lead Channel Sensing Intrinsic Amplitude: 1 mV
Lead Channel Sensing Intrinsic Amplitude: 1 mV
Lead Channel Sensing Intrinsic Amplitude: 5 mV
Lead Channel Sensing Intrinsic Amplitude: 5 mV
Lead Channel Setting Pacing Amplitude: 1.5 V
Lead Channel Setting Pacing Amplitude: 2 V
Lead Channel Setting Pacing Amplitude: 2.25 V
Lead Channel Setting Pacing Pulse Width: 0.4 ms
Lead Channel Setting Pacing Pulse Width: 0.4 ms
Lead Channel Setting Sensing Sensitivity: 0.3 mV
Zone Setting Status: 755011
Zone Setting Status: 755011

## 2022-10-20 NOTE — Telephone Encounter (Signed)
Referring MD/PCP: Olena Leatherwood  Insurance[place]ene [place] Medicaid [id number]  Best Phone Number: [phone]  Reason for the colonoscopy screening  Has patient had this procedure before?  Yes, 2011  If so, when, by whom and where?    Is there a family history of colon cancer?  no  Who?  What age when diagnosed?    Is patient diabetic? If yes, Type 1 or Type 2   no      Does patient have prosthetic heart valve or mechanical valve?  no  Do you have a pacemaker/defibrillator?  yes  Has patient ever had endocarditis/atrial fibrillation? yes  Has patient had joint replacement within last 12 months?  no  Is patient constipated or do they take laxatives? no  Does patient have a history of alcohol/drug use?  yesno  Does patient use oxygen? no  Have you had a stroke/heart attack last 6 mths? no  Do you take medicine for weight loss?  no  For male patients,: have you had a hysterectomy                       are you post menopausal                       do you still have your menstrual cycle   Do you take any blood-thinning medications such as: (aspirin, warfarin, Plavix, Aggrenox)  yes  If yes we need the name, milligram, dosage and who is prescribing doctor Eliquis 5 mg twice daily  Medications: olmesartan 40 mg daily, furosemide 20 mg daily, symbicort 160-4.5 mcg 2 puffs twice daily, ropinirole 1 mg daily, famotidine 40 mg daily, carvedilol 12.5 mg twice daily, sertraline 100 mg daily, rosuvastatin 20 mg daily, levothyroxine 112 mcg daily, farxiga  5 mg daily, spiriva inhaler daily, Eliquis 5 mg twice daily, gabapentin 300 mg 3 tab daily, Ventolin 90 mcg as needed  Allergies: tramadol, ibuprofen  Pharmacy: [facility]

## 2022-10-20 NOTE — Telephone Encounter (Signed)
Room 3, clearance to hold Eliquis Dr. Wyline Mood  Thanks

## 2022-10-31 ENCOUNTER — Encounter (INDEPENDENT_AMBULATORY_CARE_PROVIDER_SITE_OTHER): Payer: Self-pay | Admitting: *Deleted

## 2022-10-31 ENCOUNTER — Telehealth (INDEPENDENT_AMBULATORY_CARE_PROVIDER_SITE_OTHER): Payer: Self-pay | Admitting: Gastroenterology

## 2022-10-31 MED ORDER — PEG 3350-KCL-NA BICARB-NACL 420 G PO SOLR: 4000.0000 mL | Freq: Once | ORAL | 0 refills | Status: AC

## 2022-10-31 NOTE — Telephone Encounter (Signed)
Patient with diagnosis of afib on Eliquis for anticoagulation.    Procedure: colonoscopy Date of procedure: 11/21/22  CHA2DS2-VASc Score = 2  This indicates a 2.2% annual risk of stroke. The patient's score is based upon: CHF History: 1 HTN History: 1 Diabetes History: 0 Stroke History: 0 Vascular Disease History: 0 Age Score: 0 Gender Score: 0   CrCl 93mL/min using adjusted body weight Platelet count 204K  Pt hasn't been seen since February 2023 and requires updated visit. Per office protocol, patient can hold Eliquis for 1-2 days prior to procedure assuming no major clinical changes are noted at his appt.    **This guidance is not considered finalized until pre-operative APP has relayed final recommendations.**

## 2022-10-31 NOTE — Telephone Encounter (Signed)
Referral completed

## 2022-10-31 NOTE — Telephone Encounter (Signed)
Pt left message to get colonoscopy scheduled.  Returned call to patient. Pt has been scheduled for 11/21/22 at 1:30pm. Clearance sent. Prep sent to pharmacy. Will send instructions once clearance received.

## 2022-10-31 NOTE — Telephone Encounter (Signed)
Called the pt and he tells me that he see's Dr. Wyline Mood now in the [place] office. Pt I did not see any appt's in [place] before his procedure date. I informed the pt that I am going to have someone from the [place] office call him to see where they can get him scheduled for a pre op appt . Pt said thank you.

## 2022-10-31 NOTE — Telephone Encounter (Signed)
    10/31/22  Gary Kirk 1965-10-06  What type of surgery is being performed? Colonoscopy  When is surgery scheduled? 11/21/22  What type of clearance is required (medical or pharmacy to hold medication or both? Medication   Are there any medications that need to be held prior to surgery and how long? Eliquis hold for 2 days  Name of physician performing surgery?  Dr. Katrinka Blazing [facility] Phone: [phone] Fax: [phone]  Anethesia type (none, local, MAC, general)? MAC

## 2022-10-31 NOTE — Telephone Encounter (Signed)
   Name: Gary Kirk  DOB: 1966/01/21  MRN: [id number]  Primary Cardiologist: Julien Nordmann, MD  Chart reviewed as part of pre-operative protocol coverage. Because of EBAN BONKOWSKI past medical history and time since last visit, he will require a follow-up in-office visit in order to better assess preoperative cardiovascular risk.  Pre-op covering staff: - Please schedule appointment and call patient to inform them. If patient already had an upcoming appointment within acceptable timeframe, please add "pre-op clearance" to the appointment notes so provider is aware. - Please contact requesting surgeon's office via preferred method (i.e, phone, fax) to inform them of need for appointment prior to surgery.  Pt hasn't been seen since February 2023 and requires updated visit. Per office protocol, patient can hold Eliquis for 1-2 days prior to procedure assuming no major clinical changes are noted at his appt.      Joylene Grapes, NP  10/31/2022, 1:46 PM

## 2022-11-01 ENCOUNTER — Ambulatory Visit: Payer: Medicaid Other | Attending: Nurse Practitioner | Admitting: Nurse Practitioner

## 2022-11-01 ENCOUNTER — Encounter: Payer: Self-pay | Admitting: Nurse Practitioner

## 2022-11-01 VITALS — BP 120/80 | HR 77 | Ht 72.0 in | Wt 274.6 lb

## 2022-11-01 DIAGNOSIS — I5022 Chronic systolic (congestive) heart failure: Secondary | ICD-10-CM | POA: Diagnosis not present

## 2022-11-01 DIAGNOSIS — I10 Essential (primary) hypertension: Secondary | ICD-10-CM

## 2022-11-01 DIAGNOSIS — R0600 Dyspnea, unspecified: Secondary | ICD-10-CM

## 2022-11-01 DIAGNOSIS — I4891 Unspecified atrial fibrillation: Secondary | ICD-10-CM

## 2022-11-01 DIAGNOSIS — E669 Obesity, unspecified: Secondary | ICD-10-CM

## 2022-11-01 DIAGNOSIS — Z01810 Encounter for preprocedural cardiovascular examination: Secondary | ICD-10-CM

## 2022-11-01 NOTE — Progress Notes (Signed)
Cardiology Office Note:  .   Date:  11/01/2022  ID:  Gary Kirk, DOB Apr 21, 1965, MRN [id number] PCP: Toma Deiters, MD  [facility] Providers Cardiologist:  Dina Rich, MD Electrophysiologist:  Hillis Range, MD (Inactive)    History of Present Illness: .   Gary Kirk is a 57 y.o. male with a PMH of chronic systolic CHF/NICM, s/p BiV ICD (followed by electrophysiology), persistent A-fib, hypertension, OSA (intolerant to CPAP), and hypothyroidism, who presents today for preoperative cardiovascular risk assessment.  Prior history of severe systolic dysfunction with a EF at 15%. TTE EF in 2021 recovered to 55-60%.  Prior history of TEE/DCCV in 2022 for persistent symptomatic A-fib, was eventually unsuccessful.  Was seen in A-fib clinic and had antiarrhythmic discussion with patient from office visit and February 2023.  Was started on amiodarone, with plans for repeat cardioversion.  Last seen by Dr. Dina Rich on April 07, 2021. Was set up for DCCV in [facility] as [place] was too far to travel for patient. Appears this procedure was canceled.   Today he is here for pre-operative cardiovascular pre-operative cardiovascular appt. He is pending colonoscopy on 11/21/2022. Doing well overall. Denies any chest pain, palpitations, syncope, presyncope, dizziness, orthopnea, PND, swelling or significant weight changes, acute bleeding, or claudication. Does admit to shortness of breath that he attributes to the heat, overall stable per his report.    Studies Reviewed: Marland Kitchen   EKG Interpretation Date/Time:  Wednesday November 01 2022 11:33:06 EDT Ventricular Rate:  77 PR Interval:    QRS Duration:  152 QT Interval:  432 QTC Calculation: 488 R Axis:   258  Text Interpretation: Ventricular-paced rhythm Biventricular pacemaker detected When compared with ECG of 05-Apr-2021 15:08, Sinus rhythm is no longer with complete heart block Vent. rate has increased BY   3  BPM Confirmed by Sharlene Dory [phone]) on 11/01/2022 11:50:57 AM     TEE 11/2020:   1. Left ventricular ejection fraction, by estimation, is 45 to 50%. The  left ventricle has mildly decreased function.   2. Right ventricular systolic function is normal. The right ventricular  size is normal.   3. No left atrial/left atrial appendage thrombus was detected.   4. Right atrial size was mildly dilated.   5. The mitral valve is normal in structure. Mild mitral valve  regurgitation.   6. The aortic valve is normal in structure. Aortic valve regurgitation is  not visualized.   7. There is mild (Grade II) plaque.  Risk Assessment/Calculations:    CHA2DS2-VASc Score = 2   This indicates a 2.2% annual risk of stroke. The patient's score is based upon: CHF History: 1 HTN History: 1 Diabetes History: 0 Stroke History: 0 Vascular Disease History: 0 Age Score: 0 Gender Score: 0   Physical Exam:   VS:  BP 120/80   Pulse 77   Ht 6' (1.829 m)   Wt 274 lb 9.6 oz (124.6 kg)   BMI 37.24 kg/m    Wt Readings from Last 3 Encounters:  11/01/22 274 lb 9.6 oz (124.6 kg)  04/07/21 (!) 343 lb 3.2 oz (155.7 kg)  04/05/21 (!) 347 lb (157.4 kg)    GEN: Obese, 57 y.o. male in no acute distress NECK: No JVD; No carotid bruits CARDIAC: S1/S2, RRR, no murmurs, rubs, gallops RESPIRATORY:  Clear to auscultation without rales, wheezing or rhonchi  ABDOMEN: Soft, non-tender, non-distended EXTREMITIES:  No edema; No deformity   ASSESSMENT AND PLAN: .  Pre-operative cardiovascular risk assessment Gary Kirk perioperative risk of a major cardiac event is 0.9% according to the Revised Cardiac Risk Index (RCRI).  Therefore, he is at low risk for perioperative complications.   His functional capacity is fair at 4.4 METs according to the Duke Activity Status Index (DASI). Recommendations: According to ACC/AHA guidelines, no further cardiovascular testing needed.  The patient may proceed to surgery at  acceptable risk.   Antiplatelet and/or Anticoagulation Recommendations: Per office protocol, patient can hold Eliquis for 1-2 days prior to procedure and then resume when felt safe to do so after procedure as instructed by GI team.   2. HFmrEF, dyspnea Stage C, NYHA class II symptoms and dyspnea r/t heat (stable per his report). EF in 2022 45-50%. Euvolemic and well compensated on exam. Hx of ICD. Continue Coreg, Farxiga, Lasix, and Olmesartan. Low sodium diet, fluid restriction <2L, and daily weights encouraged. Educated to contact our office for weight gain of 2 lbs overnight or 5 lbs in one week.   3. A-fib Denies any recent tachycardia or palpitations. HR well controlled today. Continue Coreg and Eliquis. Denies any recent bleeding issues. Currently on appropriate dosage. Heart healthy diet and regular cardiovascular exercise encouraged.   4. HTN BP stable. No medication changes at this time. Discussed to monitor BP at home at least 2 hours after medications and sitting for 5-10 minutes. Heart healthy diet and regular cardiovascular exercise encouraged.   5. Obesity Weight loss via diet and exercise encouraged. Discussed the impact being overweight would have on cardiovascular risk.  Dispo: Follow-up with Dr. Dina Rich or APP in 6 months or sooner if anything changes.   Signed, Sharlene Dory, NP

## 2022-11-01 NOTE — Patient Instructions (Addendum)
Medication Instructions:  Your physician recommends that you continue on your current medications as directed. Please refer to the Current Medication list given to you today.  Labwork: None  Testing/Procedures: None  Follow-Up: Your physician recommends that you schedule a follow-up appointment in: 6 Months with Branch   Any Other Special Instructions Will Be Listed Below (If Applicable).  If you need a refill on your cardiac medications before your next appointment, please call your pharmacy.

## 2022-11-01 NOTE — Progress Notes (Signed)
Remote ICD transmission.   

## 2022-11-01 NOTE — Telephone Encounter (Signed)
Thanks looking forward to hear back regarding the clearance note

## 2022-11-01 NOTE — Telephone Encounter (Signed)
Pt has appt with Sharlene Dory, NP today 11/01/22 for pre op clearance. I will update all parties involved.

## 2022-11-03 ENCOUNTER — Encounter (INDEPENDENT_AMBULATORY_CARE_PROVIDER_SITE_OTHER): Payer: Self-pay

## 2022-11-03 NOTE — Progress Notes (Signed)
Instructions sent to patient with instructions to hold Eliquis

## 2022-11-15 NOTE — Patient Instructions (Signed)
JAE PETCH  11/15/2022     @PREFPERIOPPHARMACY @   Your procedure is scheduled on  11/21/2022.   [facility] to [facility] at  1130  A.M.   Call this number if you have problems the morning of surgery:  [phone]  If you experience any cold or flu symptoms such as cough, fever, chills, shortness of breath, etc. between now and your scheduled surgery, please notify us at the above number.   Remember:  Follow the diet and prep instructions given to you by the office.      Your last dose of farxiga should be on 11/17/2022.      Your last dose of eliquis should be on 11/18/2022.      DO NOT take any medications for diabetes the morning of your procedure.     Use your inhalers before you come and bring your rescue inhaler with you.     Take these medicines the morning of surgery with A SIP OF WATER        carvedilol, pepcid, gabapentin, levothyroxine, sertraline.     Do not wear jewelry, make-up or nail polish, including gel polish,  artificial nails, or any other type of covering on natural nails (fingers and  toes).  Do not wear lotions, powders, or perfumes, or deodorant.  Do not shave 48 hours prior to surgery.  Men may shave face and neck.  Do not bring valuables to the hospital.  [facility] is not responsible for any belongings or valuables.  Contacts, dentures or bridgework may not be worn into surgery.  Leave your suitcase in the car.  After surgery it may be brought to your room.  For patients admitted to the hospital, discharge time will be determined by your treatment team.  Patients discharged the day of surgery will not be allowed to drive home and must have someone with them for 24 hours.    Special instructions:   DO NOT smoke tobacco or vape for 24 hours before your procedure.  Please read over the following fact sheets that you were given. Anesthesia Post-op Instructions and Care and Recovery After Surgery      Colonoscopy, Adult, Care  After The following information offers guidance on how to care for yourself after your procedure. Your health care provider may also give you more specific instructions. If you have problems or questions, contact your health care provider. What can I expect after the procedure? After the procedure, it is common to have: A small amount of blood in your stool for 24 hours after the procedure. Some gas. Mild cramping or bloating of your abdomen. Follow these instructions at home: Eating and drinking  Drink enough fluid to keep your urine pale yellow. Follow instructions from your health care provider about eating or drinking restrictions. Resume your normal diet as told by your health care provider. Avoid heavy or fried foods that are hard to digest. Activity Rest as told by your health care provider. Avoid sitting for a long time without moving. Get up to take short walks every 1-2 hours. This is important to improve blood flow and breathing. Ask for help if you feel weak or unsteady. Return to your normal activities as told by your health care provider. Ask your health care provider what activities are safe for you. Managing cramping and bloating  Try walking around when you have cramps or feel bloated. If directed, apply heat to your abdomen as told by your health  care provider. Use the heat source that your health care provider recommends, such as a moist heat pack or a heating pad. Place a towel between your skin and the heat source. Leave the heat on for 20-30 minutes. Remove the heat if your skin turns bright red. This is especially important if you are unable to feel pain, heat, or cold. You have a greater risk of getting burned. General instructions If you were given a sedative during the procedure, it can affect you for several hours. Do not drive or operate machinery until your health care provider says that it is safe. For the first 24 hours after the procedure: Do not sign  important documents. Do not drink alcohol. Do your regular daily activities at a slower pace than normal. Eat soft foods that are easy to digest. Take over-the-counter and prescription medicines only as told by your health care provider. Keep all follow-up visits. This is important. Contact a health care provider if: You have blood in your stool 2-3 days after the procedure. Get help right away if: You have more than a small spotting of blood in your stool. You have large blood clots in your stool. You have swelling of your abdomen. You have nausea or vomiting. You have a fever. You have increasing pain in your abdomen that is not relieved with medicine. These symptoms may be an emergency. Get help right away. Call 911. Do not wait to see if the symptoms will go away. Do not drive yourself to the hospital. Summary After the procedure, it is common to have a small amount of blood in your stool. You may also have mild cramping and bloating of your abdomen. If you were given a sedative during the procedure, it can affect you for several hours. Do not drive or operate machinery until your health care provider says that it is safe. Get help right away if you have a lot of blood in your stool, nausea or vomiting, a fever, or increased pain in your abdomen. This information is not intended to replace advice given to you by your health care provider. Make sure you discuss any questions you have with your health care provider. Document Revised: 03/28/2022 Document Reviewed: 10/06/2020 Elsevier Patient Education  2024 Elsevier Inc. Monitored Anesthesia Care, Care After The following information offers guidance on how to care for yourself after your procedure. Your health care provider may also give you more specific instructions. If you have problems or questions, contact your health care provider. What can I expect after the procedure? After the procedure, it is common to  have: Tiredness. Little or no memory about what happened during or after the procedure. Impaired judgment when it comes to making decisions. Nausea or vomiting. Some trouble with balance. Follow these instructions at home: For the time period you were told by your health care provider:  Rest. Do not participate in activities where you could fall or become injured. Do not drive or use machinery. Do not drink alcohol. Do not take sleeping pills or medicines that cause drowsiness. Do not make important decisions or sign legal documents. Do not take care of children on your own. Medicines Take over-the-counter and prescription medicines only as told by your health care provider. If you were prescribed antibiotics, take them as told by your health care provider. Do not stop using the antibiotic even if you start to feel better. Eating and drinking Follow instructions from your health care provider about what you may eat and drink.  Drink enough fluid to keep your urine pale yellow. If you vomit: Drink clear fluids slowly and in small amounts as you are able. Clear fluids include water, ice chips, low-calorie sports drinks, and fruit juice that has water added to it (diluted fruit juice). Eat light and bland foods in small amounts as you are able. These foods include bananas, applesauce, rice, lean meats, toast, and crackers. General instructions  Have a responsible adult stay with you for the time you are told. It is important to have someone help care for you until you are awake and alert. If you have sleep apnea, surgery and some medicines can increase your risk for breathing problems. Follow instructions from your health care provider about wearing your sleep device: When you are sleeping. This includes during daytime naps. While taking prescription pain medicines, sleeping medicines, or medicines that make you drowsy. Do not use any products that contain nicotine or tobacco. These  products include cigarettes, chewing tobacco, and vaping devices, such as e-cigarettes. If you need help quitting, ask your health care provider. Contact a health care provider if: You feel nauseous or vomit every time you eat or drink. You feel light-headed. You are still sleepy or having trouble with balance after 24 hours. You get a rash. You have a fever. You have redness or swelling around the IV site. Get help right away if: You have trouble breathing. You have new confusion after you get home. These symptoms may be an emergency. Get help right away. Call 911. Do not wait to see if the symptoms will go away. Do not drive yourself to the hospital. This information is not intended to replace advice given to you by your health care provider. Make sure you discuss any questions you have with your health care provider. Document Revised: 07/11/2021 Document Reviewed: 07/11/2021 Elsevier Patient Education  2024 [employer].

## 2022-11-16 ENCOUNTER — Telehealth (INDEPENDENT_AMBULATORY_CARE_PROVIDER_SITE_OTHER): Payer: Self-pay | Admitting: Gastroenterology

## 2022-11-16 NOTE — Telephone Encounter (Signed)
Pt calling in to cancel TCS for 11/21/22 at 1:30pm with Levon Hedger due to car trouble. Will call back to reschedule.

## 2022-11-17 ENCOUNTER — Encounter (HOSPITAL_COMMUNITY)
Admission: RE | Admit: 2022-11-17 | Discharge: 2022-11-17 | Disposition: A | Discharge status: Home / Self Care | Payer: Medicaid Other | Source: Ambulatory Visit | Attending: Gastroenterology | Admitting: Gastroenterology

## 2022-11-17 ENCOUNTER — Encounter (HOSPITAL_COMMUNITY): Payer: Self-pay

## 2022-11-17 DIAGNOSIS — E876 Hypokalemia: Secondary | ICD-10-CM

## 2022-11-17 DIAGNOSIS — N182 Chronic kidney disease, stage 2 (mild): Secondary | ICD-10-CM

## 2022-11-21 ENCOUNTER — Ambulatory Visit (HOSPITAL_COMMUNITY): Admission: RE | Admit: 2022-11-21 | Payer: Medicaid Other | Source: Ambulatory Visit | Admitting: Gastroenterology

## 2022-11-21 ENCOUNTER — Encounter (HOSPITAL_COMMUNITY): Admission: RE | Payer: Self-pay | Source: Ambulatory Visit

## 2022-11-21 SURGERY — COLONOSCOPY WITH PROPOFOL
Anesthesia: Monitor Anesthesia Care

## 2023-01-05 ENCOUNTER — Ambulatory Visit: Payer: Medicaid Other | Admitting: Cardiovascular Disease

## 2023-01-17 ENCOUNTER — Ambulatory Visit (INDEPENDENT_AMBULATORY_CARE_PROVIDER_SITE_OTHER): Payer: Medicaid Other

## 2023-01-17 DIAGNOSIS — I5022 Chronic systolic (congestive) heart failure: Secondary | ICD-10-CM

## 2023-01-17 DIAGNOSIS — I428 Other cardiomyopathies: Secondary | ICD-10-CM | POA: Diagnosis not present

## 2023-01-18 LAB — CUP PACEART REMOTE DEVICE CHECK
Battery Remaining Longevity: 57 mo
Battery Voltage: 2.97 V
Brady Statistic RA Percent Paced: 0.01 %
Brady Statistic RV Percent Paced: 99.72 %
Date Time Interrogation Session: 20241121112506
HighPow Impedance: 69 Ohm
Implantable Lead Connection Status: 753985
Implantable Lead Connection Status: 753985
Implantable Lead Connection Status: 753985
Implantable Lead Implant Date: 20150904
Implantable Lead Implant Date: 20150904
Implantable Lead Implant Date: 20150904
Implantable Lead Location: 753858
Implantable Lead Location: 753859
Implantable Lead Location: 753860
Implantable Lead Model: 4298
Implantable Lead Model: 5076
Implantable Lead Model: 6935
Implantable Pulse Generator Implant Date: 20210524
Lead Channel Impedance Value: 1064 Ohm
Lead Channel Impedance Value: 222.34 Ohm
Lead Channel Impedance Value: 222.34 Ohm
Lead Channel Impedance Value: 264.733
Lead Channel Impedance Value: 264.733
Lead Channel Impedance Value: 286.508
Lead Channel Impedance Value: 418 Ohm
Lead Channel Impedance Value: 418 Ohm
Lead Channel Impedance Value: 475 Ohm
Lead Channel Impedance Value: 513 Ohm
Lead Channel Impedance Value: 551 Ohm
Lead Channel Impedance Value: 589 Ohm
Lead Channel Impedance Value: 608 Ohm
Lead Channel Impedance Value: 722 Ohm
Lead Channel Impedance Value: 779 Ohm
Lead Channel Impedance Value: 779 Ohm
Lead Channel Impedance Value: 893 Ohm
Lead Channel Impedance Value: 950 Ohm
Lead Channel Pacing Threshold Amplitude: 0.375 V
Lead Channel Pacing Threshold Amplitude: 1.125 V
Lead Channel Pacing Threshold Amplitude: 1.5 V
Lead Channel Pacing Threshold Pulse Width: 0.4 ms
Lead Channel Pacing Threshold Pulse Width: 0.4 ms
Lead Channel Pacing Threshold Pulse Width: 0.4 ms
Lead Channel Sensing Intrinsic Amplitude: 0.875 mV
Lead Channel Sensing Intrinsic Amplitude: 0.875 mV
Lead Channel Sensing Intrinsic Amplitude: 5 mV
Lead Channel Sensing Intrinsic Amplitude: 5 mV
Lead Channel Setting Pacing Amplitude: 1.5 V
Lead Channel Setting Pacing Amplitude: 2 V
Lead Channel Setting Pacing Amplitude: 2.25 V
Lead Channel Setting Pacing Pulse Width: 0.4 ms
Lead Channel Setting Pacing Pulse Width: 0.4 ms
Lead Channel Setting Sensing Sensitivity: 0.3 mV
Zone Setting Status: 755011
Zone Setting Status: 755011

## 2023-02-13 NOTE — Progress Notes (Signed)
Remote ICD transmission.   

## 2023-02-13 NOTE — Addendum Note (Signed)
Addended by: Geralyn Flash D on: 02/13/2023 02:22 PM   Modules accepted: Orders

## 2023-04-18 ENCOUNTER — Ambulatory Visit (INDEPENDENT_AMBULATORY_CARE_PROVIDER_SITE_OTHER): Payer: Medicaid Other

## 2023-04-18 DIAGNOSIS — I428 Other cardiomyopathies: Secondary | ICD-10-CM

## 2023-04-19 LAB — CUP PACEART REMOTE DEVICE CHECK
Battery Remaining Longevity: 52 mo
Battery Voltage: 2.97 V
Brady Statistic RA Percent Paced: 0.01 %
Brady Statistic RV Percent Paced: 99.63 %
Date Time Interrogation Session: 20250219044224
HighPow Impedance: 76 Ohm
Implantable Lead Connection Status: 753985
Implantable Lead Connection Status: 753985
Implantable Lead Connection Status: 753985
Implantable Lead Implant Date: 20150904
Implantable Lead Implant Date: 20150904
Implantable Lead Implant Date: 20150904
Implantable Lead Location: 753858
Implantable Lead Location: 753859
Implantable Lead Location: 753860
Implantable Lead Model: 4298
Implantable Lead Model: 5076
Implantable Lead Model: 6935
Implantable Pulse Generator Implant Date: 20210524
Lead Channel Impedance Value: 216.848
Lead Channel Impedance Value: 222.34 Ohm
Lead Channel Impedance Value: 240.906
Lead Channel Impedance Value: 247.704
Lead Channel Impedance Value: 266.667
Lead Channel Impedance Value: 399 Ohm
Lead Channel Impedance Value: 418 Ohm
Lead Channel Impedance Value: 475 Ohm
Lead Channel Impedance Value: 513 Ohm
Lead Channel Impedance Value: 532 Ohm
Lead Channel Impedance Value: 551 Ohm
Lead Channel Impedance Value: 608 Ohm
Lead Channel Impedance Value: 646 Ohm
Lead Channel Impedance Value: 722 Ohm
Lead Channel Impedance Value: 760 Ohm
Lead Channel Impedance Value: 836 Ohm
Lead Channel Impedance Value: 893 Ohm
Lead Channel Impedance Value: 988 Ohm
Lead Channel Pacing Threshold Amplitude: 0.375 V
Lead Channel Pacing Threshold Amplitude: 1 V
Lead Channel Pacing Threshold Amplitude: 1.125 V
Lead Channel Pacing Threshold Pulse Width: 0.4 ms
Lead Channel Pacing Threshold Pulse Width: 0.4 ms
Lead Channel Pacing Threshold Pulse Width: 0.4 ms
Lead Channel Sensing Intrinsic Amplitude: 0.875 mV
Lead Channel Sensing Intrinsic Amplitude: 0.875 mV
Lead Channel Sensing Intrinsic Amplitude: 5 mV
Lead Channel Sensing Intrinsic Amplitude: 5 mV
Lead Channel Setting Pacing Amplitude: 1.5 V
Lead Channel Setting Pacing Amplitude: 1.5 V
Lead Channel Setting Pacing Amplitude: 2.25 V
Lead Channel Setting Pacing Pulse Width: 0.4 ms
Lead Channel Setting Pacing Pulse Width: 0.4 ms
Lead Channel Setting Sensing Sensitivity: 0.3 mV
Zone Setting Status: 755011
Zone Setting Status: 755011

## 2023-05-01 ENCOUNTER — Ambulatory Visit: Payer: Medicaid Other | Admitting: Cardiology

## 2023-05-04 ENCOUNTER — Encounter: Payer: Medicaid Other | Admitting: Cardiovascular Disease

## 2023-05-23 NOTE — Addendum Note (Signed)
 Addended by: Elease Etienne A on: 05/23/2023 04:11 PM   Modules accepted: Orders

## 2023-05-23 NOTE — Progress Notes (Signed)
 Remote ICD transmission.

## 2023-06-01 ENCOUNTER — Ambulatory Visit: Attending: Cardiovascular Disease | Admitting: Cardiovascular Disease

## 2023-06-01 ENCOUNTER — Encounter: Payer: Self-pay | Admitting: Cardiovascular Disease

## 2023-06-01 VITALS — BP 126/80 | HR 91 | Ht 73.0 in | Wt 289.6 lb

## 2023-06-01 DIAGNOSIS — I428 Other cardiomyopathies: Secondary | ICD-10-CM | POA: Diagnosis not present

## 2023-06-01 NOTE — Progress Notes (Signed)
  Electrophysiology Office Note:    Date:  06/01/2023   ID:  Gary Kirk, DOB 1965/11/13, MRN [id number]  PCP:  Toma Deiters, MD   [facility] Providers Cardiologist:  Dina Rich, MD Electrophysiologist:  Hillis Range, MD (Inactive)     Referring MD: Toma Deiters, MD   History of Present Illness:    Gary Kirk is a 58 y.o. male with a medical history significant for chronic systolic CHF, BiV ICD, persistent atrial fibrillation, hypertension, obstructive sleep apnea, referred for device rhythm management.     He has a history of severe cardiomyopathy with a EF of 15% in 2021.  EF has since recovered.  He carries a diagnosis of atrial fibrillation and underwent DC cardioversion in 2022.  He has been on amiodarone at some point in the past.  He has persistently been in atrial fibrillation for years now.       Today, he reports that he is doing well from a cardiac and device standpoint.  He has no device related complaints.  He is eager to leave; this is the anniversary of his mother's death.  EKGs/Labs/Other Studies Reviewed Today:     Echocardiogram:  TEE October 2022 EF 45 to 50%.    EKG:         Physical Exam:    VS:  BP 126/80   Pulse 91   Ht 6\' 1"  (1.854 m)   Wt 289 lb 9.6 oz (131.4 kg)   SpO2 97%   BMI 38.21 kg/m     Wt Readings from Last 3 Encounters:  06/01/23 289 lb 9.6 oz (131.4 kg)  11/01/22 274 lb 9.6 oz (124.6 kg)  04/07/21 (!) 343 lb 3.2 oz (155.7 kg)     GEN:  Well nourished, well developed in no acute distress CARDIAC: RRR, no murmurs, rubs, gallops The device site is normal -- no tenderness, edema, drainage, redness, threatened erosion.  RESPIRATORY:  Normal work of breathing MUSCULOSKELETAL: no edema    ASSESSMENT & PLAN:     Medtronic BiV ICD I reviewed today's device interrogation.  See Paceart for details Normal function He is device dependent today  Permanent atrial fibrillation Rate is  controlled  History of CHF with moderately reduced ejection fraction EF 45 to 50% on TEE October 2022 Continue Farxiga 5, carvedilol 12.5  Secondary hypercoagulable state Continue apixaban 5 mg twice daily      Signed, Maurice Small, MD  06/01/2023 1:36 PM    [facility]

## 2023-06-01 NOTE — Patient Instructions (Signed)

## 2023-06-15 ENCOUNTER — Ambulatory Visit: Admitting: Nurse Practitioner

## 2023-07-18 ENCOUNTER — Ambulatory Visit: Payer: Medicaid Other

## 2023-07-18 DIAGNOSIS — I428 Other cardiomyopathies: Secondary | ICD-10-CM | POA: Diagnosis not present

## 2023-07-19 LAB — CUP PACEART REMOTE DEVICE CHECK
Battery Remaining Longevity: 47 mo
Battery Voltage: 2.97 V
Brady Statistic RA Percent Paced: 0.01 %
Brady Statistic RV Percent Paced: 99.76 %
Date Time Interrogation Session: 20250521012203
HighPow Impedance: 93 Ohm
Implantable Lead Connection Status: 753985
Implantable Lead Connection Status: 753985
Implantable Lead Connection Status: 753985
Implantable Lead Implant Date: 20150904
Implantable Lead Implant Date: 20150904
Implantable Lead Implant Date: 20150904
Implantable Lead Location: 753858
Implantable Lead Location: 753859
Implantable Lead Location: 753860
Implantable Lead Model: 4298
Implantable Lead Model: 5076
Implantable Lead Model: 6935
Implantable Pulse Generator Implant Date: 20210524
Lead Channel Impedance Value: 1026 Ohm
Lead Channel Impedance Value: 1121 Ohm
Lead Channel Impedance Value: 230.327
Lead Channel Impedance Value: 241.412
Lead Channel Impedance Value: 264.733
Lead Channel Impedance Value: 279.484
Lead Channel Impedance Value: 299.908
Lead Channel Impedance Value: 418 Ohm
Lead Channel Impedance Value: 456 Ohm
Lead Channel Impedance Value: 513 Ohm
Lead Channel Impedance Value: 532 Ohm
Lead Channel Impedance Value: 608 Ohm
Lead Channel Impedance Value: 608 Ohm
Lead Channel Impedance Value: 646 Ohm
Lead Channel Impedance Value: 722 Ohm
Lead Channel Impedance Value: 836 Ohm
Lead Channel Impedance Value: 874 Ohm
Lead Channel Impedance Value: 950 Ohm
Lead Channel Pacing Threshold Amplitude: 0.375 V
Lead Channel Pacing Threshold Amplitude: 1.125 V
Lead Channel Pacing Threshold Amplitude: 1.25 V
Lead Channel Pacing Threshold Pulse Width: 0.4 ms
Lead Channel Pacing Threshold Pulse Width: 0.4 ms
Lead Channel Pacing Threshold Pulse Width: 0.4 ms
Lead Channel Sensing Intrinsic Amplitude: 1.25 mV
Lead Channel Sensing Intrinsic Amplitude: 1.25 mV
Lead Channel Sensing Intrinsic Amplitude: 4.75 mV
Lead Channel Sensing Intrinsic Amplitude: 4.75 mV
Lead Channel Setting Pacing Amplitude: 1.5 V
Lead Channel Setting Pacing Amplitude: 1.75 V
Lead Channel Setting Pacing Amplitude: 2.25 V
Lead Channel Setting Pacing Pulse Width: 0.4 ms
Lead Channel Setting Pacing Pulse Width: 0.4 ms
Lead Channel Setting Sensing Sensitivity: 0.3 mV
Zone Setting Status: 755011
Zone Setting Status: 755011

## 2023-07-27 ENCOUNTER — Ambulatory Visit: Payer: Self-pay | Admitting: Cardiovascular Disease

## 2023-09-06 NOTE — Addendum Note (Signed)
 Addended by: VICCI SELLER A on: 09/06/2023 03:45 PM   Modules accepted: Orders

## 2023-09-06 NOTE — Progress Notes (Signed)
 Remote ICD transmission.

## 2023-10-17 ENCOUNTER — Ambulatory Visit (INDEPENDENT_AMBULATORY_CARE_PROVIDER_SITE_OTHER): Payer: Medicaid Other

## 2023-10-17 DIAGNOSIS — I428 Other cardiomyopathies: Secondary | ICD-10-CM

## 2023-10-18 LAB — CUP PACEART REMOTE DEVICE CHECK
Battery Remaining Longevity: 40 mo
Battery Voltage: 2.96 V
Brady Statistic RA Percent Paced: 0.01 %
Brady Statistic RV Percent Paced: 99.87 %
Date Time Interrogation Session: 20250820001605
HighPow Impedance: 77 Ohm
Implantable Lead Connection Status: 753985
Implantable Lead Connection Status: 753985
Implantable Lead Connection Status: 753985
Implantable Lead Implant Date: 20150904
Implantable Lead Implant Date: 20150904
Implantable Lead Implant Date: 20150904
Implantable Lead Location: 753858
Implantable Lead Location: 753859
Implantable Lead Location: 753860
Implantable Lead Model: 4298
Implantable Lead Model: 5076
Implantable Lead Model: 6935
Implantable Pulse Generator Implant Date: 20210524
Lead Channel Impedance Value: 1007 Ohm
Lead Channel Impedance Value: 1140 Ohm
Lead Channel Impedance Value: 228 Ohm
Lead Channel Impedance Value: 250.943
Lead Channel Impedance Value: 256.983
Lead Channel Impedance Value: 286.508
Lead Channel Impedance Value: 306.303
Lead Channel Impedance Value: 399 Ohm
Lead Channel Impedance Value: 475 Ohm
Lead Channel Impedance Value: 532 Ohm
Lead Channel Impedance Value: 551 Ohm
Lead Channel Impedance Value: 589 Ohm
Lead Channel Impedance Value: 646 Ohm
Lead Channel Impedance Value: 665 Ohm
Lead Channel Impedance Value: 722 Ohm
Lead Channel Impedance Value: 817 Ohm
Lead Channel Impedance Value: 836 Ohm
Lead Channel Impedance Value: 931 Ohm
Lead Channel Pacing Threshold Amplitude: 0.375 V
Lead Channel Pacing Threshold Amplitude: 1.125 V
Lead Channel Pacing Threshold Amplitude: 1.375 V
Lead Channel Pacing Threshold Pulse Width: 0.4 ms
Lead Channel Pacing Threshold Pulse Width: 0.4 ms
Lead Channel Pacing Threshold Pulse Width: 0.4 ms
Lead Channel Sensing Intrinsic Amplitude: 1.375 mV
Lead Channel Sensing Intrinsic Amplitude: 1.375 mV
Lead Channel Sensing Intrinsic Amplitude: 4.75 mV
Lead Channel Sensing Intrinsic Amplitude: 4.75 mV
Lead Channel Setting Pacing Amplitude: 1.5 V
Lead Channel Setting Pacing Amplitude: 2 V
Lead Channel Setting Pacing Amplitude: 2.25 V
Lead Channel Setting Pacing Pulse Width: 0.4 ms
Lead Channel Setting Pacing Pulse Width: 0.4 ms
Lead Channel Setting Sensing Sensitivity: 0.3 mV
Zone Setting Status: 755011
Zone Setting Status: 755011

## 2023-10-22 ENCOUNTER — Ambulatory Visit: Payer: Self-pay | Admitting: Cardiovascular Disease

## 2023-11-21 NOTE — Progress Notes (Signed)
Remote ICD Transmission.

## 2024-01-02 ENCOUNTER — Encounter (INDEPENDENT_AMBULATORY_CARE_PROVIDER_SITE_OTHER): Payer: Self-pay | Admitting: Gastroenterology

## 2024-01-16 ENCOUNTER — Ambulatory Visit: Payer: Medicaid Other

## 2024-01-16 DIAGNOSIS — I428 Other cardiomyopathies: Secondary | ICD-10-CM

## 2024-01-17 LAB — CUP PACEART REMOTE DEVICE CHECK
Battery Remaining Longevity: 32 mo
Battery Voltage: 2.95 V
Brady Statistic RA Percent Paced: 0.01 %
Brady Statistic RV Percent Paced: 99.91 %
Date Time Interrogation Session: 20251119012304
HighPow Impedance: 90 Ohm
Implantable Lead Connection Status: 753985
Implantable Lead Connection Status: 753985
Implantable Lead Connection Status: 753985
Implantable Lead Implant Date: 20150904
Implantable Lead Implant Date: 20150904
Implantable Lead Implant Date: 20150904
Implantable Lead Location: 753858
Implantable Lead Location: 753859
Implantable Lead Location: 753860
Implantable Lead Model: 4298
Implantable Lead Model: 5076
Implantable Lead Model: 6935
Implantable Pulse Generator Implant Date: 20210524
Lead Channel Impedance Value: 1064 Ohm
Lead Channel Impedance Value: 1178 Ohm
Lead Channel Impedance Value: 249.509
Lead Channel Impedance Value: 255.093
Lead Channel Impedance Value: 276.59 Ohm
Lead Channel Impedance Value: 283.468
Lead Channel Impedance Value: 308.894
Lead Channel Impedance Value: 456 Ohm
Lead Channel Impedance Value: 475 Ohm
Lead Channel Impedance Value: 551 Ohm
Lead Channel Impedance Value: 589 Ohm
Lead Channel Impedance Value: 608 Ohm
Lead Channel Impedance Value: 608 Ohm
Lead Channel Impedance Value: 646 Ohm
Lead Channel Impedance Value: 703 Ohm
Lead Channel Impedance Value: 836 Ohm
Lead Channel Impedance Value: 893 Ohm
Lead Channel Impedance Value: 950 Ohm
Lead Channel Pacing Threshold Amplitude: 0.375 V
Lead Channel Pacing Threshold Amplitude: 1.125 V
Lead Channel Pacing Threshold Amplitude: 1.25 V
Lead Channel Pacing Threshold Pulse Width: 0.4 ms
Lead Channel Pacing Threshold Pulse Width: 0.4 ms
Lead Channel Pacing Threshold Pulse Width: 0.4 ms
Lead Channel Sensing Intrinsic Amplitude: 0.875 mV
Lead Channel Sensing Intrinsic Amplitude: 0.875 mV
Lead Channel Sensing Intrinsic Amplitude: 3.875 mV
Lead Channel Sensing Intrinsic Amplitude: 3.875 mV
Lead Channel Setting Pacing Amplitude: 1.5 V
Lead Channel Setting Pacing Amplitude: 1.75 V
Lead Channel Setting Pacing Amplitude: 2.25 V
Lead Channel Setting Pacing Pulse Width: 0.4 ms
Lead Channel Setting Pacing Pulse Width: 0.4 ms
Lead Channel Setting Sensing Sensitivity: 0.3 mV
Zone Setting Status: 755011
Zone Setting Status: 755011

## 2024-01-18 NOTE — Progress Notes (Signed)
 Remote ICD Transmission

## 2024-01-28 ENCOUNTER — Ambulatory Visit: Payer: Self-pay | Admitting: Cardiovascular Disease

## 2024-02-14 ENCOUNTER — Encounter: Payer: Self-pay | Admitting: Cardiovascular Disease

## 2024-02-14 ENCOUNTER — Telehealth (HOSPITAL_BASED_OUTPATIENT_CLINIC_OR_DEPARTMENT_OTHER): Payer: Self-pay | Admitting: *Deleted

## 2024-02-14 NOTE — Telephone Encounter (Signed)
° °  Pre-operative Risk Assessment    Patient Name: Gary Kirk  DOB: Aug 17, 1965 MRN: [id number]   Date of last office visit: 06/01/23 DR. MEALOR Date of next office visit: NONE   Request for Surgical Clearance    Procedure:  CATARACT EXTRACTION BY PE, IOL-RIGHT EYE  Date of Surgery:  Clearance 03/14/24                                Surgeon:  DR. LONNI FAIRLY Surgeon's Group or Practice Name:   [facility] Phone number:  [phone] EXT [phone] Fax number:  [phone]   Type of Clearance Requested:   - Medical ; HOWEVER THEY WILL NEED DEVICE CLEARANCE; THIS WILL BE SENT TO DEVICE POOL AS WELL   Type of Anesthesia:  IV SEDATION   Additional requests/questions:    Signed, Kamren Heskett   02/14/2024, 12:03 PM

## 2024-02-14 NOTE — Progress Notes (Signed)
 PERIOPERATIVE PRESCRIPTION FOR IMPLANTED CARDIAC DEVICE PROGRAMMING  Patient Information: Name:  DEJAY KRONK  DOB:  August 18, 1965  MRN:  [id number]  Procedure:  CATARACT EXTRACTION BY PE, IOL-RIGHT EYE   Date of Surgery:  Clearance 03/14/24                                Surgeon:  DR. LONNI FAIRLY Surgeon's Group or Practice Name:  [facility] Phone number:  [phone] EXT [phone] Fax number:  [phone]   Type of Clearance Requested:   - Medical   Type of Anesthesia:  IV SEDATION  Device Information:  Clinic EP Physician:  DR. EULAS FURBISH  Device Type:  Defibrillator Manufacturer and Phone #:  Medtronic: [id number] Pacemaker Dependent?:  Yes.   Date of Last Device Check:  01/16/24 Remote , 06/01/23 In Office  Normal Device Function?:  Yes.    Electrophysiologist's Recommendations:  Have magnet available. Provide continuous ECG monitoring when magnet is used or reprogramming is to be performed.  Procedure will likely interfere with device function.  Device should be programmed:  Tachy therapies disabled  Per Device Clinic Standing Orders, Prentice JINNY Silvan, RN  12:52 PM 02/14/2024

## 2024-02-14 NOTE — Telephone Encounter (Signed)
° °  Patient Name: Gary Kirk  DOB: Jul 20, 1965 MRN: [id number]  Primary Cardiologist: Alvan Carrier, MD  Chart reviewed as part of pre-operative protocol coverage. Cataract extractions are recognized in guidelines as low risk surgeries that do not typically require specific preoperative testing or holding of blood thinner therapy. Therefore, given past medical history and time since last visit, based on ACC/AHA guidelines, Gary Kirk would be at acceptable risk for the planned procedure without further cardiovascular testing.   I will route this recommendation to the requesting party via Epic fax function and remove from pre-op pool.  Please call with questions.  Barnie Hila, NP 02/14/2024, 12:46 PM

## 2024-02-19 NOTE — Telephone Encounter (Signed)
 Both medical and device clearance have now been routed to [facility] at provided fax #[phone]

## 2024-02-19 NOTE — Telephone Encounter (Signed)
 [facility] called in asking if both medical and device clearance can be faxed over again. She confirmed fax number we have is correct [phone]
